# Patient Record
Sex: Female | Born: 1965
Health system: Southern US, Community
[De-identification: ages and names within clinical notes are randomized; demographics above are authoritative.]

## PROBLEM LIST (undated history)

## (undated) DIAGNOSIS — K859 Acute pancreatitis without necrosis or infection, unspecified: Secondary | ICD-10-CM

## (undated) DIAGNOSIS — I10 Essential (primary) hypertension: Secondary | ICD-10-CM

## (undated) DIAGNOSIS — K5792 Diverticulitis of intestine, part unspecified, without perforation or abscess without bleeding: Secondary | ICD-10-CM

## (undated) DIAGNOSIS — E785 Hyperlipidemia, unspecified: Secondary | ICD-10-CM

## (undated) DIAGNOSIS — E042 Nontoxic multinodular goiter: Secondary | ICD-10-CM

## (undated) DIAGNOSIS — I499 Cardiac arrhythmia, unspecified: Secondary | ICD-10-CM

## (undated) DIAGNOSIS — E039 Hypothyroidism, unspecified: Secondary | ICD-10-CM

## (undated) DIAGNOSIS — G4730 Sleep apnea, unspecified: Secondary | ICD-10-CM

## (undated) HISTORY — DX: Nontoxic multinodular goiter: E04.2

## (undated) HISTORY — DX: Hypothyroidism, unspecified: E03.9

## (undated) HISTORY — DX: Diverticulitis of intestine, part unspecified, without perforation or abscess without bleeding: K57.92

## (undated) HISTORY — DX: Hyperlipidemia, unspecified: E78.5

## (undated) HISTORY — DX: Acute pancreatitis without necrosis or infection, unspecified: K85.90

## (undated) HISTORY — DX: Sleep apnea, unspecified: G47.30

---

## 1998-06-09 DIAGNOSIS — K859 Acute pancreatitis without necrosis or infection, unspecified: Secondary | ICD-10-CM

## 1998-06-09 HISTORY — DX: Acute pancreatitis without necrosis or infection, unspecified: K85.90

## 1998-06-09 HISTORY — PX: CHOLECYSTECTOMY: SHX55

## 2007-10-20 ENCOUNTER — Ambulatory Visit: Payer: Self-pay | Admitting: Internal Medicine

## 2011-09-26 ENCOUNTER — Encounter: Payer: Self-pay | Admitting: Internal Medicine

## 2011-09-26 ENCOUNTER — Ambulatory Visit (INDEPENDENT_AMBULATORY_CARE_PROVIDER_SITE_OTHER): Payer: BC Managed Care – PPO | Admitting: Internal Medicine

## 2011-09-26 VITALS — BP 152/90 | HR 99 | Temp 98.4°F | Resp 16 | Ht 64.5 in | Wt 241.5 lb

## 2011-09-26 DIAGNOSIS — Z8719 Personal history of other diseases of the digestive system: Secondary | ICD-10-CM | POA: Insufficient documentation

## 2011-09-26 DIAGNOSIS — E782 Mixed hyperlipidemia: Secondary | ICD-10-CM | POA: Insufficient documentation

## 2011-09-26 DIAGNOSIS — E042 Nontoxic multinodular goiter: Secondary | ICD-10-CM | POA: Insufficient documentation

## 2011-09-26 DIAGNOSIS — F3289 Other specified depressive episodes: Secondary | ICD-10-CM

## 2011-09-26 DIAGNOSIS — E669 Obesity, unspecified: Secondary | ICD-10-CM

## 2011-09-26 DIAGNOSIS — F32A Depression, unspecified: Secondary | ICD-10-CM

## 2011-09-26 DIAGNOSIS — F329 Major depressive disorder, single episode, unspecified: Secondary | ICD-10-CM

## 2011-09-26 DIAGNOSIS — R5381 Other malaise: Secondary | ICD-10-CM

## 2011-09-26 DIAGNOSIS — R5383 Other fatigue: Secondary | ICD-10-CM

## 2011-09-26 DIAGNOSIS — E785 Hyperlipidemia, unspecified: Secondary | ICD-10-CM

## 2011-09-26 DIAGNOSIS — E039 Hypothyroidism, unspecified: Secondary | ICD-10-CM

## 2011-09-26 MED ORDER — BUPROPION HCL ER (SR) 100 MG PO TB12: 100.0000 mg | ORAL_TABLET | Freq: Two times a day (BID) | ORAL | Status: DC

## 2011-09-26 MED ORDER — ALPRAZOLAM 0.25 MG PO TABS: 0.2500 mg | ORAL_TABLET | Freq: Every evening | ORAL | Status: DC | PRN

## 2011-09-26 NOTE — Patient Instructions (Signed)
Consider the Low Glycemic Index Diet and 6 smaller meals daily :   7 AM Low carbohydrate Protein  Shakes (EAS Carb Control  Or Atkins ,  Available everywhere,   In  cases at BJs )  2.5 carbs  (Add or substitute a toasted sandwhich thin w/ peanut butter)  10 AM: Protein bar by Atkins (snack size,  Chocolate lover's variety at  BJ's)    Lunch: sandwich on pita bread or flatbread (Joseph's makes a pita bread and a flat bread , available at [employer] and BJ's; Toufayah makes a low carb flatbread available at [employer] and [employer])  Mission and bueno vida both make low carb whole wheat tortilla   3 PM:  Mid day :  Another protein bar,  Or a  cheese stick, 1/4 cup of almonds, walnuts, pistachios, pecans, peanuts,  Macadamia nuts  6 PM  Dinner:  "mean and green:"  Meat/chicken/fish, salad, and green veggie : use ranch, vinagrette,  Blue cheese, etc  9 PM snack : Breyer's low carb fudgiscle or  ice cream bar (Carb Smart) Weight Watcher's ice cream bar , or another protein shake

## 2011-09-26 NOTE — Progress Notes (Signed)
Patient ID: Brittany Brooks, female   DOB: 1965/06/18, 46 y.o.   MRN: [id number]  Patient Active Problem List  Diagnoses  . Multinodular goiter (nontoxic)  . Hyperlipidemia  . Pancreatitis  . Obesity (BMI 30-39.9)  . Unspecified hypothyroidism  . Depression    Subjective:  CC:   Chief Complaint  Patient presents with  . New Patient    HPI:   Brittany Brooks a 46 y.o. female who presents to establish care.  She is peri menopausal with some symptoms of depression and anxiety over the two years complicated by financial stressors.  Her symptoms are managed with infrequent use of alprazolam prescribed by her prior PCP Thedore Mins, but she has a history of a prior trial of wellbutrin . Wakes up feeling blue mostly every day.  She has had a weight gain of 40 lbs over 2 years.     Past Medical History  Diagnosis Date  . Hypothyroidism     since early 20's, secondary to Hashimoto's  . Multinodular goiter (nontoxic)     biopsy negativre by [facility]  . Hyperlipidemia   . Pancreatitis 2000    secondary to cholangiogram during lap chole, Sankar    Past Surgical History  Procedure Date  . Cholecystectomy 2000    recurrent cholecystitis during pregnancy  . Cesarean section Sept 2000         The following portions of the patient's history were reviewed and updated as appropriate: Allergies, current medications, and problem list.    Review of Systems:   12 Pt  review of systems was negative except those addressed in the HPI,     History   Social History  . Marital Status: Married    Spouse Name: N/A    Number of Children: N/A  . Years of Education: N/A   Occupational History  . Not on file.   Social History Main Topics  . Smoking status: Never Smoker   . Smokeless tobacco: Never Used  . Alcohol Use: 2.4 oz/week    4 Glasses of wine per week  . Drug Use: No  . Sexually Active: Not on file   Other Topics Concern  . Not on file   Social History Narrative  . No  narrative on file    Objective:  BP 152/90  Pulse 99  Temp(Src) 98.4 F (36.9 C) (Oral)  Resp 16  Ht 5' 4.5" (1.638 m)  Wt 241 lb 8 oz (109.544 kg)  BMI 40.81 kg/m2  SpO2 98%  LMP 09/19/2011  General appearance: alert, cooperative and appears stated age Ears: normal TM's and external ear canals both ears Throat: lips, mucosa, and tongue normal; teeth and gums normal Neck: no adenopathy, no carotid bruit, supple, symmetrical, trachea midline and thyroid not enlarged, symmetric, no tenderness/mass/nodules Back: symmetric, no curvature. ROM normal. No CVA tenderness. Lungs: clear to auscultation bilaterally Heart: regular rate and rhythm, S1, S2 normal, no murmur, click, rub or gallop Abdomen: soft, non-tender; bowel sounds normal; no masses,  no organomegaly Pulses: 2+ and symmetric Skin: Skin color, texture, turgor normal. No rashes or lesions Lymph nodes: Cervical, supraclavicular, and axillary nodes normal.  Assessment and Plan:  Obesity (BMI 30-39.9) I have addressed  BMI and recommended a low glycemic index diet utilizing smaller more frequent meals to increase metabolism.  I have also recommended that patient start exercising with a goal of 30 minutes of aerobic exercise a minimum of 5 days per week. Screening for lipid disorders, thyroid and diabetes to be done  today.     Unspecified hypothyroidism Managed with Synthroid.   Depression We discussed resuming a trial of wellbutrin to help manage her symptoms .  Will start with wellbutrin sr 100 mg bid.     Updated Medication List Outpatient Encounter Prescriptions as of 09/26/2011  Medication Sig Dispense Refill  . ALPRAZolam (XANAX) 0.25 MG tablet Take 1 tablet (0.25 mg total) by mouth at bedtime as needed.  30 tablet  3  . buPROPion (WELLBUTRIN SR) 100 MG 12 hr tablet Take 1 tablet (100 mg total) by mouth 2 (two) times daily.  60 tablet  2  . levothyroxine (SYNTHROID, LEVOTHROID) 88 MCG tablet Take 88 mcg by mouth  daily.      . Multiple Vitamin (MULTIVITAMIN) tablet Take 1 tablet by mouth daily.      Marland Kitchen DISCONTD: ALPRAZolam (XANAX) 0.25 MG tablet Take 0.25 mg by mouth at bedtime as needed.         Orders Placed This Encounter  Procedures  . TSH  . Lipid panel  . COMPLETE METABOLIC PANEL WITH GFR  . CBC with Differential    No Follow-up on file.

## 2011-09-28 ENCOUNTER — Encounter: Payer: Self-pay | Admitting: Internal Medicine

## 2011-09-28 DIAGNOSIS — E038 Other specified hypothyroidism: Secondary | ICD-10-CM | POA: Insufficient documentation

## 2011-09-28 DIAGNOSIS — F339 Major depressive disorder, recurrent, unspecified: Secondary | ICD-10-CM | POA: Insufficient documentation

## 2011-09-28 DIAGNOSIS — E669 Obesity, unspecified: Secondary | ICD-10-CM | POA: Insufficient documentation

## 2011-09-28 NOTE — Assessment & Plan Note (Signed)
I have addressed  BMI and recommended a low glycemic index diet utilizing smaller more frequent meals to increase metabolism.  I have also recommended that patient start exercising with a goal of 30 minutes of aerobic exercise a minimum of 5 days per week. Screening for lipid disorders, thyroid and diabetes to be done today.   

## 2011-09-28 NOTE — Assessment & Plan Note (Signed)
We discussed resuming a trial of wellbutrin to help manage her symptoms .  Will start with wellbutrin sr 100 mg bid.

## 2011-09-28 NOTE — Assessment & Plan Note (Signed)
Managed with Synthroid.

## 2011-09-30 ENCOUNTER — Other Ambulatory Visit: Payer: BC Managed Care – PPO

## 2011-10-03 ENCOUNTER — Other Ambulatory Visit (INDEPENDENT_AMBULATORY_CARE_PROVIDER_SITE_OTHER): Payer: BC Managed Care – PPO | Admitting: *Deleted

## 2011-10-03 DIAGNOSIS — R5381 Other malaise: Secondary | ICD-10-CM

## 2011-10-03 DIAGNOSIS — E785 Hyperlipidemia, unspecified: Secondary | ICD-10-CM

## 2011-10-03 DIAGNOSIS — R5383 Other fatigue: Secondary | ICD-10-CM

## 2011-10-03 DIAGNOSIS — E039 Hypothyroidism, unspecified: Secondary | ICD-10-CM

## 2011-10-03 LAB — CBC WITH DIFFERENTIAL/PLATELET
Basophils Relative: 0.4 % (ref 0.0–3.0)
Hemoglobin: 12.9 g/dL (ref 12.0–15.0)
Lymphocytes Relative: 19 % (ref 12.0–46.0)
Monocytes Relative: 7.8 % (ref 3.0–12.0)
Neutro Abs: 6 10*3/uL (ref 1.4–7.7)
RBC: 4.82 Mil/uL (ref 3.87–5.11)

## 2011-10-03 LAB — LIPID PANEL
Cholesterol: 263 mg/dL — ABNORMAL HIGH (ref 0–200)
HDL: 36 mg/dL — ABNORMAL LOW (ref >39.00)
Total CHOL/HDL Ratio: 7
Triglycerides: 294 mg/dL — ABNORMAL HIGH (ref 0.0–149.0)
VLDL: 58.8 mg/dL — ABNORMAL HIGH (ref 0.0–40.0)

## 2011-10-04 LAB — COMPLETE METABOLIC PANEL WITH GFR
ALT: 23 U/L (ref 0–35)
CO2: 26 mEq/L (ref 19–32)
Creat: 0.66 mg/dL (ref 0.50–1.10)
GFR, Est African American: >89 mL/min
GFR, Est Non African American: >89 mL/min
Total Bilirubin: 0.4 mg/dL (ref 0.3–1.2)

## 2011-10-20 ENCOUNTER — Other Ambulatory Visit: Payer: Self-pay | Admitting: Internal Medicine

## 2011-10-20 MED ORDER — LEVOTHYROXINE SODIUM 88 MCG PO TABS: 88.0000 ug | ORAL_TABLET | Freq: Every day | ORAL | Status: DC

## 2011-10-29 ENCOUNTER — Ambulatory Visit: Payer: BC Managed Care – PPO | Admitting: Internal Medicine

## 2012-04-28 ENCOUNTER — Encounter: Payer: Self-pay | Admitting: Internal Medicine

## 2012-04-28 ENCOUNTER — Ambulatory Visit (INDEPENDENT_AMBULATORY_CARE_PROVIDER_SITE_OTHER): Payer: BC Managed Care – PPO | Admitting: Internal Medicine

## 2012-04-28 VITALS — BP 138/82 | HR 80 | Temp 98.4°F | Resp 12 | Ht 66.0 in | Wt 243.5 lb

## 2012-04-28 DIAGNOSIS — F3289 Other specified depressive episodes: Secondary | ICD-10-CM

## 2012-04-28 DIAGNOSIS — E785 Hyperlipidemia, unspecified: Secondary | ICD-10-CM

## 2012-04-28 DIAGNOSIS — E8881 Metabolic syndrome: Secondary | ICD-10-CM

## 2012-04-28 DIAGNOSIS — F32A Depression, unspecified: Secondary | ICD-10-CM

## 2012-04-28 DIAGNOSIS — F329 Major depressive disorder, single episode, unspecified: Secondary | ICD-10-CM

## 2012-04-28 DIAGNOSIS — E669 Obesity, unspecified: Secondary | ICD-10-CM

## 2012-04-28 DIAGNOSIS — E039 Hypothyroidism, unspecified: Secondary | ICD-10-CM

## 2012-04-28 MED ORDER — CITALOPRAM HYDROBROMIDE 10 MG PO TABS: 10.0000 mg | ORAL_TABLET | Freq: Every day | ORAL | Status: DC

## 2012-04-28 MED ORDER — ALPRAZOLAM 0.25 MG PO TABS: 0.2500 mg | ORAL_TABLET | Freq: Every evening | ORAL | Status: DC | PRN

## 2012-04-28 NOTE — Progress Notes (Signed)
Patient ID: Brittany Brooks, female   DOB: 30-Mar-1966, 46 y.o.   MRN: [id number]   Patient Active Problem List  Diagnosis  . Multinodular goiter (nontoxic)  . Hyperlipidemia  . Pancreatitis  . Obesity (BMI 30-39.9)  . Unspecified hypothyroidism  . Depression  . Metabolic syndrome    Subjective:  CC:   Chief Complaint  Patient presents with  . Follow-up    HPI:   Brittany Brooks a 46 y.o. female who presents  Follow up on chronic medical conditions including depression, anxiety and obesity.  Her periods have been irregular.  Some are heavy and lasting 7 days,  Some last 2 days, stop and  then return. Cycle is usually  25 to 30 days.  2) Depression:  She did not resume the trial of  wellbutrin .  She has been having increased anxiety.  Last month used xanax daily in the morning to manage waking up with jitters.  Cites stressor both at work and at home .  Wants to try citalopram.     Past Medical History  Diagnosis Date  . Hypothyroidism     since early 20's, secondary to Hashimoto's  . Multinodular goiter (nontoxic)     biopsy negativre by [facility]  . Hyperlipidemia   . Pancreatitis 2000    secondary to cholangiogram during lap chole, Sankar    Past Surgical History  Procedure Date  . Cholecystectomy 2000    recurrent cholecystitis during pregnancy  . Cesarean section Sept 2000    Current Outpatient Prescriptions on File Prior to Visit  Medication Sig Dispense Refill  . levothyroxine (SYNTHROID, LEVOTHROID) 88 MCG tablet Take 1 tablet (88 mcg total) by mouth daily.  30 tablet  3  . Multiple Vitamin (MULTIVITAMIN) tablet Take 1 tablet by mouth daily.      . citalopram (CELEXA) 10 MG tablet Take 1 tablet (10 mg total) by mouth daily.  30 tablet  3   No Known Allergies  The following portions of the patient's history were reviewed and updated as appropriate: Allergies, current medications, and problem list.    History   Social History  . Marital Status: Married   Spouse Name: N/A    Number of Children: N/A  . Years of Education: N/A   Occupational History  . Not on file.   Social History Main Topics  . Smoking status: Never Smoker   . Smokeless tobacco: Never Used  . Alcohol Use: 2.4 oz/week    4 Glasses of wine per week  . Drug Use: No  . Sexually Active: Not on file   Other Topics Concern  . Not on file   Social History Narrative  . No narrative on file   Review of Systems  Patient denies headache, fevers, malaise, unintentional weight loss, skin rash, eye pain, sinus congestion and sinus pain, sore throat, dysphagia,  hemoptysis , cough, dyspnea, wheezing, chest pain, palpitations, orthopnea, edema, abdominal pain, nausea, melena, diarrhea, constipation, flank pain, dysuria, hematuria, urinary  Frequency, nocturia, numbness, tingling, seizures,  Focal weakness, Loss of consciousness,  Tremor, insomnia, depression, anxiety, and suicidal ideation.     Objective:  BP 138/82  Pulse 80  Temp 98.4 F (36.9 C) (Oral)  Resp 12  Ht 5\' 6"  (1.676 m)  Wt 243 lb 8 oz (110.451 kg)  BMI 39.30 kg/m2  SpO2 97%  LMP 04/19/2012  General appearance: alert, cooperative and appears stated age Ears: normal TM's and external ear canals both ears Throat: lips, mucosa, and tongue normal;  teeth and gums normal Neck: no adenopathy, no carotid bruit, supple, symmetrical, trachea midline and thyroid not enlarged, symmetric, no tenderness/mass/nodules Back: symmetric, no curvature. ROM normal. No CVA tenderness. Lungs: clear to auscultation bilaterally Heart: regular rate and rhythm, S1, S2 normal, no murmur, click, rub or gallop Abdomen: soft, non-tender; bowel sounds normal; no masses,  no organomegaly Pulses: 2+ and symmetric Skin: Skin color, texture, turgor normal. No rashes or lesions Lymph nodes: Cervical, supraclavicular, and axillary nodes normal.  Assessment and Plan: Metabolic syndrome With hyperlipidemia, obesity and menstrual  irregularities strongly suggestive of PCOS. I have addressed  BMI and recommended a low glycemic index diet utilizing smaller more frequent meals to increase metabolism.  I have also recommended that patient start exercising with a goal of 30 minutes of aerobic exercise a minimum of 5 days per week. Screening for lipid disorders, thyroid and diabetes to be done soon when patient is fasting.   Obesity (BMI 30-39.9) Her BMI remains unchanged despite exercise. We discussed the nature and quality of her exercises well as of her diet. Usually what I find is that people are not exercising as vigorously as they should to achieve a sustained heart rate in the aerobic zone. I suggested that she consider joining a gym and using a personal trainer to help guide her efforts.   I also am advising her to get back on the low GI diet using six smaller meals a day to stimulate her metabolism.    Depression Agree with trial of citalopram, starting with 4 mg daily for one week, and titrating up every two weeks.  Return in one month.    Updated Medication List Outpatient Encounter Prescriptions as of 04/28/2012  Medication Sig Dispense Refill  . ALPRAZolam (XANAX) 0.25 MG tablet Take 1 tablet (0.25 mg total) by mouth at bedtime as needed.  30 tablet  3  . levothyroxine (SYNTHROID, LEVOTHROID) 88 MCG tablet Take 1 tablet (88 mcg total) by mouth daily.  30 tablet  3  . Multiple Vitamin (MULTIVITAMIN) tablet Take 1 tablet by mouth daily.      . [DISCONTINUED] ALPRAZolam (XANAX) 0.25 MG tablet Take 1 tablet (0.25 mg total) by mouth at bedtime as needed.  30 tablet  3  . citalopram (CELEXA) 10 MG tablet Take 1 tablet (10 mg total) by mouth daily.  30 tablet  3  . [DISCONTINUED] buPROPion (WELLBUTRIN SR) 100 MG 12 hr tablet Take 1 tablet (100 mg total) by mouth 2 (two) times daily.  60 tablet  2

## 2012-04-28 NOTE — Patient Instructions (Addendum)
Start with 1/2 tablet of citalopram for a few days  Before moving up to whole tablet.  Take  With dinner  We can increase to 20 mg after two weeks if you are handling the 10 mg dose without side effects..   Return for fasting labs  And thyroid check.   This is  my version of a  "Low GI"  Diet:  All of the foods can be found at grocery stores and in bulk at [employer].  The Atkins protein bars and shakes are available in more varieties at [employer], [employer] and [employer].     7 AM Breakfast:  Low carbohydrate Protein  Shakes (I recommend the EAS [employer]Edge "Carb Control" shakes  Or the low carb shakes by Atkins.   Both are available everywhere:  In  cases at BJs  Or in 4 packs at grocery stores and pharmacies  2.5 carbs  (Alternative is  a toasted Arnold's Sandwhich Thin w/ peanut butter, a "Bagel Thin" with cream cheese and salmon) or  a scrambled egg burrito made with a low carb tortilla .  Avoid cereal and bananas, oatmeal too unless you are cooking the old fashioned kind that takes 30-40 minutes to prepare.  the rest is overly processed, has minimal fiber, and is loaded with carbohydrates!   10 AM: Protein bar by Atkins (the snack size, under 200 cal).  There are many varieties , available widely again or in bulk in limited varieties at BJs)  Other so called "protein bars" tend to be loaded with carbohydrates.  Remember, in food advertising, the word "energy" is synonymous for " carbohydrate."  Lunch: sandwich of [place], (or any lunchmeat, grilled meat or canned tuna), fresh avocado, mayonnaise  and cheese on a lower carbohydrate pita bread, flatbread, or tortilla . Ok to use regular mayonnaise. The bread is the only source or carbohydrate that can be decreased (Joseph's makes a pita bread and a flat bread that are 50 cal and 4 net carbs ; Toufayan makes a low carb flatbread that's 100 cal and 9 net carbs  and  Mission makes a low carb whole wheat tortilla  That is 210 cal and 6 net carbs)  3 PM:   Mid day :  Another protein bar,  Or a  cheese stick (100 cal, 0 carbs),  Or 1 ounce of  almonds, walnuts, pistachios, pecans, peanuts,  Macadamia nuts. Or a Dannon light n Fit greek yogurt, 80 cal 8 net carbs . Avoid "granola"; the dried cranberries and raisins are loaded with carbohydrates. Mixed nuts ok if no raisins or cranberries or dried fruit.      6 PM  Dinner:  "mean and green:"  Meat/chicken/fish or a high protein legume; , with a green salad, and a low GI  Veggie (broccoli, cauliflower, green beans, spinach, brussel sprouts. Lima beans) : Avoid "Low fat dressings, as well as Reyne Dumas and [address]! They are loaded with sugar! Instead use ranch, vinagrette,  Blue cheese, etc.  There is a low carb pasta by Dreamfield's available at [employer] [facility] that is acceptable and tastes great. Try Michel Angel's chicken piccata over low carb pasta. The chicken dish is 0 carbs, and can be found in frozen section at BJs and Lowe's. Also try [employer] "Carnitas" (pulled pork, no sauce,  0 carbs) and his pot roast.   both are in the refrigerated section at BJs   9 PM snack : Breyer's "low carb" fudgsicle or  ice cream  bar (Carb Smart line), or  Weight Watcher's ice cream bar , or another "no sugar added" ice cream;a serving of fresh berries/cherries with whipped cream (Avoid bananas, pineapple, grapes  and watermelon on a regular basis because they are high in sugar)   Remember that snack Substitutions should be less than 15 to 20 carbs  Per serving. Remember to subtract fiber grams and sugar alcohols to get the "net carbs."

## 2012-04-29 ENCOUNTER — Other Ambulatory Visit: Payer: Self-pay

## 2012-04-29 DIAGNOSIS — E282 Polycystic ovarian syndrome: Secondary | ICD-10-CM | POA: Insufficient documentation

## 2012-04-29 NOTE — Assessment & Plan Note (Addendum)
With hyperlipidemia, obesity and menstrual irregularities strongly suggestive of PCOS. I have addressed  BMI and recommended a low glycemic index diet utilizing smaller more frequent meals to increase metabolism.  I have also recommended that patient start exercising with a goal of 30 minutes of aerobic exercise a minimum of 5 days per week. Screening for lipid disorders, thyroid and diabetes to be done soon when patient is fasting.

## 2012-05-01 ENCOUNTER — Encounter: Payer: Self-pay | Admitting: Internal Medicine

## 2012-05-01 NOTE — Assessment & Plan Note (Signed)
Agree with trial of citalopram, starting with 4 mg daily for one week, and titrating up every two weeks.  Return in one month.

## 2012-05-01 NOTE — Assessment & Plan Note (Signed)
Her BMI remains unchanged despite exercise. We discussed the nature and quality of her exercises well as of her diet. Usually what I find is that people are not exercising as vigorously as they should to achieve a sustained heart rate in the aerobic zone. I suggested that she consider joining a gym and using a personal trainer to help guide her efforts.   I also am advising her to get back on the low GI diet using six smaller meals a day to stimulate her metabolism.   

## 2012-05-04 ENCOUNTER — Other Ambulatory Visit: Payer: BC Managed Care – PPO

## 2012-05-18 ENCOUNTER — Other Ambulatory Visit (INDEPENDENT_AMBULATORY_CARE_PROVIDER_SITE_OTHER): Payer: BC Managed Care – PPO

## 2012-05-18 DIAGNOSIS — E039 Hypothyroidism, unspecified: Secondary | ICD-10-CM

## 2012-05-18 DIAGNOSIS — E785 Hyperlipidemia, unspecified: Secondary | ICD-10-CM

## 2012-05-18 DIAGNOSIS — E8881 Metabolic syndrome: Secondary | ICD-10-CM

## 2012-05-18 LAB — COMPREHENSIVE METABOLIC PANEL
ALT: 31 U/L (ref 0–35)
Albumin: 3.9 g/dL (ref 3.5–5.2)
Alkaline Phosphatase: 54 U/L (ref 39–117)
Glucose, Bld: 106 mg/dL — ABNORMAL HIGH (ref 70–99)
Potassium: 3.9 mEq/L (ref 3.5–5.1)
Sodium: 137 mEq/L (ref 135–145)
Total Protein: 7.6 g/dL (ref 6.0–8.3)

## 2012-05-18 LAB — LIPID PANEL
HDL: 30.7 mg/dL — ABNORMAL LOW (ref >39.00)
Total CHOL/HDL Ratio: 8

## 2012-05-18 LAB — TSH: TSH: 5.15 u[IU]/mL (ref 0.35–5.50)

## 2012-05-19 MED ORDER — LEVOTHYROXINE SODIUM 100 MCG PO TABS: 100.0000 ug | ORAL_TABLET | Freq: Every day | ORAL | Status: DC

## 2012-05-19 NOTE — Addendum Note (Signed)
Addended by: Sherlene Shams on: 05/19/2012 01:32 PM   Modules accepted: Orders

## 2012-07-13 ENCOUNTER — Other Ambulatory Visit (INDEPENDENT_AMBULATORY_CARE_PROVIDER_SITE_OTHER): Payer: BC Managed Care – PPO

## 2012-07-13 DIAGNOSIS — E785 Hyperlipidemia, unspecified: Secondary | ICD-10-CM

## 2012-07-13 DIAGNOSIS — E039 Hypothyroidism, unspecified: Secondary | ICD-10-CM

## 2012-07-13 DIAGNOSIS — E8881 Metabolic syndrome: Secondary | ICD-10-CM

## 2012-07-13 LAB — TSH: TSH: 6.22 u[IU]/mL — ABNORMAL HIGH (ref 0.35–5.50)

## 2012-07-13 LAB — LIPID PANEL
Cholesterol: 253 mg/dL — ABNORMAL HIGH (ref 0–200)
HDL: 29.8 mg/dL — ABNORMAL LOW (ref >39.00)
Triglycerides: 363 mg/dL — ABNORMAL HIGH (ref 0.0–149.0)
VLDL: 72.6 mg/dL — ABNORMAL HIGH (ref 0.0–40.0)

## 2012-07-13 LAB — COMPREHENSIVE METABOLIC PANEL
AST: 32 U/L (ref 0–37)
Alkaline Phosphatase: 56 U/L (ref 39–117)
BUN: 12 mg/dL (ref 6–23)
Creatinine, Ser: 0.7 mg/dL (ref 0.4–1.2)
Glucose, Bld: 112 mg/dL — ABNORMAL HIGH (ref 70–99)

## 2012-07-13 LAB — HEMOGLOBIN A1C: Hgb A1c MFr Bld: 6.1 % (ref 4.6–6.5)

## 2012-07-13 LAB — LDL CHOLESTEROL, DIRECT: Direct LDL: 134.6 mg/dL

## 2012-07-13 MED ORDER — LEVOTHYROXINE SODIUM 112 MCG PO TABS: 112.0000 ug | ORAL_TABLET | Freq: Every day | ORAL | Status: DC

## 2012-07-13 NOTE — Addendum Note (Signed)
Addended by: Sherlene Shams on: 07/13/2012 05:33 PM   Modules accepted: Orders

## 2012-07-13 NOTE — Assessment & Plan Note (Signed)
Thyroid function is underactive on current 100 mcg  dose.  I have sent a lower dose of levothyroxine to her pharmacy

## 2012-07-20 ENCOUNTER — Encounter: Payer: Self-pay | Admitting: Internal Medicine

## 2012-07-20 ENCOUNTER — Ambulatory Visit (INDEPENDENT_AMBULATORY_CARE_PROVIDER_SITE_OTHER): Payer: BC Managed Care – PPO | Admitting: Internal Medicine

## 2012-07-20 VITALS — BP 130/100 | HR 98 | Temp 98.1°F | Resp 16 | Wt 245.0 lb

## 2012-07-20 DIAGNOSIS — M539 Dorsopathy, unspecified: Secondary | ICD-10-CM

## 2012-07-20 DIAGNOSIS — E282 Polycystic ovarian syndrome: Secondary | ICD-10-CM

## 2012-07-20 DIAGNOSIS — M4322 Fusion of spine, cervical region: Secondary | ICD-10-CM

## 2012-07-20 DIAGNOSIS — E782 Mixed hyperlipidemia: Secondary | ICD-10-CM

## 2012-07-20 DIAGNOSIS — E039 Hypothyroidism, unspecified: Secondary | ICD-10-CM

## 2012-07-20 DIAGNOSIS — F339 Major depressive disorder, recurrent, unspecified: Secondary | ICD-10-CM

## 2012-07-20 MED ORDER — HYDROCODONE-ACETAMINOPHEN 7.5-750 MG PO TABS: 1.0000 | ORAL_TABLET | Freq: Every evening | ORAL | Status: DC | PRN

## 2012-07-20 MED ORDER — TRAMADOL HCL 50 MG PO TABS: 50.0000 mg | ORAL_TABLET | Freq: Four times a day (QID) | ORAL | Status: DC | PRN

## 2012-07-20 MED ORDER — HYDROCODONE-ACETAMINOPHEN 5-325 MG PO TABS: 1.0000 | ORAL_TABLET | Freq: Four times a day (QID) | ORAL | Status: DC | PRN

## 2012-07-20 MED ORDER — PREDNISONE (PAK) 10 MG PO TABS: ORAL_TABLET | ORAL | Status: DC

## 2012-07-20 NOTE — Progress Notes (Signed)
Patient ID: Brittany Brooks, female   DOB: 1965/11/24, 47 y.o.   MRN: [id number]  Patient Active Problem List  Diagnosis  . Multinodular goiter (nontoxic)  . Mixed dyslipidemia  . Pancreatitis  . Obesity (BMI 30-39.9)  . Unspecified hypothyroidism  . Major depressive disorder, recurrent episode, unspecified  . Polycystic ovarian syndrome  . Cervical spine ankylosis    Subjective:  CC:   Chief Complaint  Patient presents with  . Follow-up    Labs    HPI:   Brittany Brooks a 47 y.o. female who presents Follow up on depression,  hypothyroidism, and obesity with PCOS, along with acute problems.  1) Obesity;  She has not lost weight yet but has made some attempts to reduce starches from her diet. She is not exercising. Her fasting labs were done recently and an elevatedd fasting glucose level and underactive thyroid level were noted.    2) She has been suffering for 2 months with sinus congestion, constant drainage and cough.  No fevers, facial or ear pain.  Using OTC decongestants.    3) She has been having posterior neck pain for several month which radiates to left arm.  She has been receiving Chiropractic manipulation from [facility] without improvement and he believes she has a C4-C6 vertebral problem and recommended an orthopedic evaluation.  She has made an appt to see Reita Chard.  The pai nis constant but has flares of increased discomfort lasting up to  A week at a time which she is treating with excessive daily amount s of Advil.  No recent unusual or repetitive activity to suggest an injury.  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  Wears a headset . She recently tried a friend's TENS Unit which aggravated it.   4) her depression/anxiety  has responded to citalopram and she is functioning well .     Past Medical History  Diagnosis Date  . Hypothyroidism     since early 20's, secondary to Hashimoto's  . Multinodular goiter (nontoxic)     biopsy negativre by [facility]   . Hyperlipidemia   . Pancreatitis 2000    secondary to cholangiogram during lap chole, Sankar    Past Surgical History  Procedure Laterality Date  . Cholecystectomy  2000    recurrent cholecystitis during pregnancy  . Cesarean section  Sept 2000       The following portions of the patient's history were reviewed and updated as appropriate: Allergies, current medications, and problem list.    Review of Systems:   Patient denies headache, fevers, malaise, unintentional weight loss, skin rash, eye pain, sinus pain, sore throat, dysphagia,  hemoptysis , cough, dyspnea, wheezing, chest pain, palpitations, orthopnea, edema, abdominal pain, nausea, melena, diarrhea, constipation, flank pain, dysuria, hematuria, urinary  Frequency, nocturia, numbness, tingling, seizures,  Focal weakness, Loss of consciousness,  Tremor, insomnia, depression, anxiety, and suicidal ideation.        History   Social History  . Marital Status: Married    Spouse Name: N/A    Number of Children: N/A  . Years of Education: N/A   Occupational History  . Not on file.   Social History Main Topics  . Smoking status: Never Smoker   . Smokeless tobacco: Never Used  . Alcohol Use: 2.4 oz/week    4 Glasses of wine per week  . Drug Use: No  . Sexually Active: Not on file   Other Topics Concern  . Not on file  Social History Narrative  . No narrative on file    Objective:  BP 130/100  Pulse 98  Temp(Src) 98.1 F (36.7 C) (Oral)  Resp 16  Wt 245 lb (111.131 kg)  BMI 39.56 kg/m2  SpO2 97%  LMP 07/09/2012  General appearance: alert, cooperative and appears stated age Ears: normal TM's and external ear canals both ears Sinuses:  No maxillary or frontal sinus tenderness Throat: lips, mucosa, and tongue normal; oropharynx normal Neck: no adenopathy, no carotid bruit, supple, symmetrical, trachea midline and thyroid not enlarged, symmetric, no tenderness/mass/nodules Back: symmetric, no  curvature. ROM normal. No CVA tenderness. Lungs: clear to auscultation bilaterally Heart: regular rate and rhythm, S1, S2 normal, no murmur, click, rub or gallop Abdomen: soft, non-tender; bowel sounds normal; no masses,  no organomegaly Pulses: 2+ and symmetric Skin: Skin color, texture, turgor normal. No rashes or lesions Lymph nodes: Cervical, supraclavicular, and axillary nodes normal.  Assessment and Plan:  Major depressive disorder, recurrent episode, unspecified Symptoms have improved with citalopram resumed one month ago. Denies insomnia, malaise and anhedonia.   Polycystic ovarian syndrome Suggested by history of obesity, irregular menstrual cycle, impaired fasting glucose  And dyslipidemia with  HDL < 39 and trigs of 300.  I have addressed  BMI and discussed and provided a low glycemic index diet utilizing smaller more frequent meals to increase metabolism.  I have also recommended that patient start exercising with a goal of 30 minutes of aerobic exercise a minimum of 5 days per week. Return for repeat labs (lipids and A1c) in 6 weeks.    Unspecified hypothyroidism Secondary to MNG.  tsh is elevated and Synthroid dose has been increased.,  Will return in 6 weeks for repeat levels.   Mixed dyslipidemia HDL is < 30, trgis are 360 and LDl  Is normal at 136 (pending determination of diabetic status).  No meds unless Hga1c is > 6.5   Cervical spine ankylosis Presumed, with Orthopedic evaluation by self referral.  MSK and neuro exam normal.  Overusing NIADs due to constant pain.  Recommended trial of prednisone and tramadol and qhs vicodin prn.   A total of 40 minutes, face to face time, was spent in evaluation and treatment of patient, more than half of which was spent counselling patient and coorrdinating care.      Updated Medication List Outpatient Encounter Prescriptions as of 07/20/2012  Medication Sig Dispense Refill  . ALPRAZolam (XANAX) 0.25 MG tablet Take 1 tablet (0.25  mg total) by mouth at bedtime as needed.  30 tablet  3  . citalopram (CELEXA) 10 MG tablet Take 1 tablet (10 mg total) by mouth daily.  30 tablet  3  . Flaxseed, Linseed, (FLAX SEED OIL) 1000 MG CAPS Take 1,000 mg by mouth daily.      Marland Kitchen levothyroxine (SYNTHROID, LEVOTHROID) 112 MCG tablet Take 1 tablet (112 mcg total) by mouth daily.  30 tablet  3  . Multiple Vitamin (MULTIVITAMIN) tablet Take 1 tablet by mouth daily.      Marland Kitchen HYDROcodone-acetaminophen (NORCO/VICODIN) 5-325 MG per tablet Take 1 tablet by mouth every 6 (six) hours as needed for pain.  30 tablet  3  . predniSONE (STERAPRED UNI-PAK) 10 MG tablet 6 tablets on Day 1 , then reduce by 1 tablet daily until gone  21 tablet  0  . traMADol (ULTRAM) 50 MG tablet Take 1 tablet (50 mg total) by mouth every 6 (six) hours as needed for pain.  120 tablet  2  . [  DISCONTINUED] HYDROcodone-acetaminophen (VICODIN ES) 7.5-750 MG per tablet Take 1 tablet by mouth at bedtime as needed for pain.  30 tablet  1   No facility-administered encounter medications on file as of 07/20/2012.     Orders Placed This Encounter  Procedures  . TSH    No Follow-up on file.

## 2012-07-20 NOTE — Patient Instructions (Addendum)
use the tramadol during the day  Up to 6 daily if needed   Start the prednisone in the morning.  Take all 6 tablets at once on Day 1   Save the vicodin for evening.  beware of constipation

## 2012-07-22 ENCOUNTER — Encounter: Payer: Self-pay | Admitting: Internal Medicine

## 2012-07-22 DIAGNOSIS — M452 Ankylosing spondylitis of cervical region: Secondary | ICD-10-CM | POA: Insufficient documentation

## 2012-07-22 DIAGNOSIS — M4322 Fusion of spine, cervical region: Secondary | ICD-10-CM | POA: Insufficient documentation

## 2012-07-22 NOTE — Assessment & Plan Note (Signed)
Symptoms have improved with citalopram resumed one month ago. Denies insomnia, malaise and anhedonia.

## 2012-07-22 NOTE — Assessment & Plan Note (Signed)
Presumed, with Orthopedic evaluation by self referral.  MSK and neuro exam normal.  Overusing NIADs due to constant pain.  Recommended trial of prednisone and tramadol and qhs vicoin prn.

## 2012-07-22 NOTE — Assessment & Plan Note (Addendum)
Suggested by history of obesity, irregular menstrual cycle, impaired fasting glucose  And dyslipidemia with  HDL < 39 and trigs of 300.  I have addressed  BMI and recommended a low glycemic index diet utilizing smaller more frequent meals to increase metabolism.  I have also recommended that patient start exercising with a goal of 30 minutes of aerobic exercise a minimum of 5 days per week. Return for repeat labs (lipids and A1c) in 6 weeks.

## 2012-07-22 NOTE — Assessment & Plan Note (Signed)
HDL is < 30, trgis are 360 and LDl  Is normal at 136 (pending determination of diabetic status).  No meds unless Hga1c is > 6.5

## 2012-07-22 NOTE — Assessment & Plan Note (Signed)
Secondary to MNG.  tsh is elevated and Synthroid dose has been increased.,  Will return in 6 weeks for repeat levels.

## 2012-08-26 ENCOUNTER — Ambulatory Visit: Payer: Self-pay | Admitting: Specialist

## 2012-09-06 ENCOUNTER — Other Ambulatory Visit: Payer: BC Managed Care – PPO

## 2012-09-07 ENCOUNTER — Other Ambulatory Visit (INDEPENDENT_AMBULATORY_CARE_PROVIDER_SITE_OTHER): Payer: BC Managed Care – PPO

## 2012-09-07 DIAGNOSIS — E039 Hypothyroidism, unspecified: Secondary | ICD-10-CM

## 2012-09-08 ENCOUNTER — Encounter: Payer: Self-pay | Admitting: General Practice

## 2012-09-08 LAB — TSH: TSH: 1.78 u[IU]/mL (ref 0.35–5.50)

## 2012-09-08 NOTE — Progress Notes (Signed)
Letter mailed to pt.  

## 2012-10-21 ENCOUNTER — Encounter: Payer: Self-pay | Admitting: Specialist

## 2012-11-02 ENCOUNTER — Telehealth: Payer: Self-pay | Admitting: Internal Medicine

## 2012-11-02 MED ORDER — CEPHALEXIN 500 MG PO TABS: 500.0000 mg | ORAL_TABLET | Freq: Four times a day (QID) | ORAL | Status: DC

## 2012-11-02 NOTE — Telephone Encounter (Signed)
Patient notified

## 2012-11-02 NOTE — Telephone Encounter (Signed)
no

## 2012-11-02 NOTE — Telephone Encounter (Signed)
Patient stated she was able to get appointment with her Dermatologist  For tomorrow so so should she still pick up the Antibiotic. Please advise.

## 2012-11-02 NOTE — Telephone Encounter (Signed)
Patient Information:  Caller Name: Briza  Phone: [phone]  Patient: Armanii, Urbanik  Gender: Female  DOB: [birth year]  Age: 47 Years  PCP: Duncan Dull (Adults only)  Pregnant: No  Office Follow Up:  Does the office need to follow up with this patient?: Yes  Instructions For The Office: Disposition is Office Now - schedule is full.  Please contact pt with further instructions.   Symptoms  Reason For Call & Symptoms: 10/31/12 red bump on left breast at top closer to arm, little smaller than a dime, slightly raised, not itchy or painful, small spot in center from possbile bug bite.  Late evening, red streak developed from bump about 2.5 inches long.  Afebrile.  Applying hydrocortisone cream.  Reviewed Health History In EMR: Yes  Reviewed Medications In EMR: Yes  Reviewed Allergies In EMR: Yes  Reviewed Surgeries / Procedures: Yes  Date of Onset of Symptoms: 10/31/2012  Treatments Tried: Hydrocortisone cream  Treatments Tried Worked: No OB / GYN:  LMP: 10/25/2012  Guideline(s) Used:  Insect Bite  Disposition Per Guideline:   Go to Office Now  Reason For Disposition Reached:   Red streak or red line and length > 2 inches (5 cm)  Advice Given:  Antibiotic Ointment:   Cover the scab with a Band-Aid to prevent scratching and spread.  Repeat washing the sore, the antibiotic ointment, and the Band-Aid 4 times per day until healed.  Call Back If:  Severe pain lasts over 2 hours after pain medicine  You become worse.  Patient Will Follow Care Advice:  YES

## 2012-11-02 NOTE — Telephone Encounter (Signed)
Please ask her to start the antibitoic i sent to her pharamacy and have herfollow up with me or Raquel later on this week (wednesday or later)

## 2012-11-02 NOTE — Telephone Encounter (Signed)
Tried to call patient for further evaluation no answer please advise .

## 2012-11-07 ENCOUNTER — Encounter: Payer: Self-pay | Admitting: Specialist

## 2012-12-07 ENCOUNTER — Encounter: Payer: Self-pay | Admitting: Specialist

## 2012-12-08 ENCOUNTER — Other Ambulatory Visit: Payer: Self-pay | Admitting: Internal Medicine

## 2013-05-26 ENCOUNTER — Telehealth: Payer: Self-pay | Admitting: Internal Medicine

## 2013-05-26 NOTE — Telephone Encounter (Signed)
Pt had already called office before speaking with CAN. Per Dr. Dan Humphreys, appt was offered for 05/30/13 or 05/31/13, pt accepted appt for 05/31/13.

## 2013-05-26 NOTE — Telephone Encounter (Signed)
Patient Information:  Caller Name: Brittany Brooks  Phone: [phone]  Patient: Brittany Brooks, Brittany Brooks  Gender: Female  DOB: 10/24/1965  Age: 47 Years  PCP: Brittany Brooks (Adults only)  Pregnant: No  Office Follow Up:  Does the office need to follow up with this patient?: No  Instructions For The Office: N/A  RN Note:  Dizziness, lightheaded for past 2 weeks she related to cold symptoms.  Seen by ENT 05/26/13; positional vertigo test negative. Pulse is regular and not rapid. Called Brittany Brooks/office for ED disposition when office is open for dizziness/lightheaded with episode of palpitations this morning.  Brittany Brooks said Brittany Brooks palpitation event was discussed with Brittany Brooks who said to keep apptointment for 05/21/13, the soonest the MD can see her; may go to ED for acute symptoms.  Advised to go to [facility] ED now.  Symptoms  Reason For Call & Symptoms: Emergent call: chest feels heavy "related to feeling tired."  Called 911 05/26/13 at 0400 for palpitations lasting 10-15 minutes.  Evaluated only by EMTs.  BP 160/90 P 104,  Declined to go to ED.  Transferred for triage after follow up appointment with Brittany Brittany Brooks scheduled by staff for 0845 05/01/13.  Reviewed Health History In EMR: Yes  Reviewed Medications In EMR: Yes  Reviewed Allergies In EMR: Yes  Reviewed Surgeries / Procedures: Yes  Date of Onset of Symptoms: 05/26/2013 OB / GYN:  LMP: 05/08/2013  Guideline(s) Used:  Heart Rate and Heartbeat Questions  Disposition Per Guideline:   Go to ED Now  Reason For Disposition Reached:   Dizziness, lightheadedness, or weakness  Advice Given:  N/A  Patient Will Follow Care Advice:  YES

## 2013-05-31 ENCOUNTER — Ambulatory Visit: Payer: BC Managed Care – PPO | Admitting: Internal Medicine

## 2013-06-01 ENCOUNTER — Ambulatory Visit: Payer: BC Managed Care – PPO | Admitting: Internal Medicine

## 2013-06-14 ENCOUNTER — Telehealth: Payer: Self-pay | Admitting: Internal Medicine

## 2013-06-14 DIAGNOSIS — E669 Obesity, unspecified: Secondary | ICD-10-CM

## 2013-06-14 DIAGNOSIS — R5381 Other malaise: Secondary | ICD-10-CM

## 2013-06-14 DIAGNOSIS — R5383 Other fatigue: Secondary | ICD-10-CM

## 2013-06-14 DIAGNOSIS — E559 Vitamin D deficiency, unspecified: Secondary | ICD-10-CM

## 2013-06-14 DIAGNOSIS — E042 Nontoxic multinodular goiter: Secondary | ICD-10-CM

## 2013-06-14 DIAGNOSIS — E785 Hyperlipidemia, unspecified: Secondary | ICD-10-CM

## 2013-06-14 NOTE — Telephone Encounter (Signed)
The patient is asking to have a full blood panel done, before she comes in for a follow up. She saw a cardiologist and he saw that her thyroid panel was elevated from when it was last done by Dr. Darrick Huntsmanullo and he thought it would be best if she had a full blood panel done at this office.

## 2013-06-15 NOTE — Telephone Encounter (Signed)
Fasting labs ordered.,

## 2013-06-15 NOTE — Telephone Encounter (Signed)
Patient has an appointment scheduled with you on 07/01/13, but would like labs prior for her thyroid per cardiologist's suggestion please advise? Last TSH here 4/14.

## 2013-06-16 NOTE — Telephone Encounter (Signed)
Patient is scheduled for lab work for 06/17/2013.

## 2013-06-16 NOTE — Telephone Encounter (Signed)
Please call to schedule her a fasting lab appointment

## 2013-06-17 ENCOUNTER — Other Ambulatory Visit (INDEPENDENT_AMBULATORY_CARE_PROVIDER_SITE_OTHER): Payer: BC Managed Care – PPO

## 2013-06-17 DIAGNOSIS — E042 Nontoxic multinodular goiter: Secondary | ICD-10-CM

## 2013-06-17 DIAGNOSIS — R5381 Other malaise: Secondary | ICD-10-CM

## 2013-06-17 DIAGNOSIS — E785 Hyperlipidemia, unspecified: Secondary | ICD-10-CM

## 2013-06-17 DIAGNOSIS — E559 Vitamin D deficiency, unspecified: Secondary | ICD-10-CM

## 2013-06-17 DIAGNOSIS — R5383 Other fatigue: Secondary | ICD-10-CM

## 2013-06-17 DIAGNOSIS — E039 Hypothyroidism, unspecified: Secondary | ICD-10-CM

## 2013-06-17 LAB — CBC WITH DIFFERENTIAL/PLATELET
Basophils Absolute: 0 10*3/uL (ref 0.0–0.1)
Basophils Relative: 0.5 % (ref 0.0–3.0)
Eosinophils Absolute: 0.3 10*3/uL (ref 0.0–0.7)
Eosinophils Relative: 3.2 % (ref 0.0–5.0)
HCT: 35.5 % — ABNORMAL LOW (ref 36.0–46.0)
Hemoglobin: 12 g/dL (ref 12.0–15.0)
LYMPHS PCT: 19.2 % (ref 12.0–46.0)
Lymphs Abs: 1.6 10*3/uL (ref 0.7–4.0)
MCHC: 33.7 g/dL (ref 30.0–36.0)
MCV: 79.9 fl (ref 78.0–100.0)
Monocytes Absolute: 0.6 10*3/uL (ref 0.1–1.0)
Monocytes Relative: 7.8 % (ref 3.0–12.0)
NEUTROS PCT: 69.3 % (ref 43.0–77.0)
Neutro Abs: 5.6 10*3/uL (ref 1.4–7.7)
PLATELETS: 302 10*3/uL (ref 150.0–400.0)
RBC: 4.45 Mil/uL (ref 3.87–5.11)
RDW: 13.4 % (ref 11.5–14.6)
WBC: 8.1 10*3/uL (ref 4.5–10.5)

## 2013-06-17 LAB — COMPREHENSIVE METABOLIC PANEL
ALK PHOS: 51 U/L (ref 39–117)
ALT: 27 U/L (ref 0–35)
AST: 27 U/L (ref 0–37)
Albumin: 3.8 g/dL (ref 3.5–5.2)
BUN: 10 mg/dL (ref 6–23)
CALCIUM: 8.7 mg/dL (ref 8.4–10.5)
CHLORIDE: 106 meq/L (ref 96–112)
CO2: 26 mEq/L (ref 19–32)
Creatinine, Ser: 0.8 mg/dL (ref 0.4–1.2)
GFR: 88.77 mL/min (ref >60.00)
Glucose, Bld: 96 mg/dL (ref 70–99)
Potassium: 4.1 mEq/L (ref 3.5–5.1)
SODIUM: 138 meq/L (ref 135–145)
Total Bilirubin: 0.4 mg/dL (ref 0.3–1.2)
Total Protein: 7.2 g/dL (ref 6.0–8.3)

## 2013-06-17 LAB — LIPID PANEL
CHOL/HDL RATIO: 8
Cholesterol: 241 mg/dL — ABNORMAL HIGH (ref 0–200)
HDL: 31.2 mg/dL — ABNORMAL LOW (ref >39.00)
Triglycerides: 295 mg/dL — ABNORMAL HIGH (ref 0.0–149.0)
VLDL: 59 mg/dL — ABNORMAL HIGH (ref 0.0–40.0)

## 2013-06-17 LAB — LDL CHOLESTEROL, DIRECT: Direct LDL: 135.8 mg/dL

## 2013-06-18 LAB — VITAMIN D 25 HYDROXY (VIT D DEFICIENCY, FRACTURES): VIT D 25 HYDROXY: 27 ng/mL — AB (ref 30–89)

## 2013-06-20 ENCOUNTER — Encounter: Payer: Self-pay | Admitting: *Deleted

## 2013-06-21 LAB — TSH+FREE T4
Free T4: 0.96 ng/dL (ref 0.82–1.77)
TSH: 5.06 u[IU]/mL — AB (ref 0.450–4.500)

## 2013-06-21 MED ORDER — LEVOTHYROXINE SODIUM 125 MCG PO TABS: ORAL_TABLET | ORAL | Status: DC

## 2013-06-21 NOTE — Addendum Note (Signed)
Addended by: Sherlene ShamsULLO, Demir Titsworth L on: 06/21/2013 10:09 PM   Modules accepted: Orders

## 2013-06-21 NOTE — Assessment & Plan Note (Signed)
TSH was > 5 on 112 mcg dose.  Increased jan 13

## 2013-07-01 ENCOUNTER — Encounter: Payer: Self-pay | Admitting: Internal Medicine

## 2013-07-01 ENCOUNTER — Ambulatory Visit (INDEPENDENT_AMBULATORY_CARE_PROVIDER_SITE_OTHER): Payer: BC Managed Care – PPO | Admitting: Internal Medicine

## 2013-07-01 VITALS — BP 124/72 | HR 84 | Temp 98.6°F | Resp 16 | Wt 234.0 lb

## 2013-07-01 DIAGNOSIS — E782 Mixed hyperlipidemia: Secondary | ICD-10-CM

## 2013-07-01 DIAGNOSIS — K859 Acute pancreatitis without necrosis or infection, unspecified: Secondary | ICD-10-CM

## 2013-07-01 DIAGNOSIS — E039 Hypothyroidism, unspecified: Secondary | ICD-10-CM

## 2013-07-01 DIAGNOSIS — I499 Cardiac arrhythmia, unspecified: Secondary | ICD-10-CM

## 2013-07-01 DIAGNOSIS — E669 Obesity, unspecified: Secondary | ICD-10-CM

## 2013-07-01 DIAGNOSIS — E282 Polycystic ovarian syndrome: Secondary | ICD-10-CM

## 2013-07-01 MED ORDER — ALPRAZOLAM 0.25 MG PO TABS: 0.2500 mg | ORAL_TABLET | Freq: Every evening | ORAL | Status: DC | PRN

## 2013-07-01 NOTE — Progress Notes (Signed)
Patient ID: Brittany Brooks, female   DOB: May 07, 1966, 48 y.o.   MRN: [id number]    Patient Active Problem List   Diagnosis Date Noted  . Arrhythmia 07/03/2013  . Cervical spine ankylosis 07/22/2012  . Polycystic ovarian syndrome 04/29/2012  . Obesity (BMI 30-39.9) 09/28/2011  . Unspecified hypothyroidism 09/28/2011  . Major depressive disorder, recurrent episode, unspecified 09/28/2011  . Multinodular goiter (nontoxic)   . Mixed dyslipidemia   . History of pancreatitis     Subjective:  CC:   Chief Complaint  Patient presents with  . Follow-up    labs and thyroid    HPI:   Brittany Brooks a 48 y.o. female who presents Followup on multiple issues. Last seen February 2014  She had an episode of arrhythmia dec 18th , woke up with racing heart, quivering , pre syncopal,  Called EMT but by the time EMT arrived,  Rhythm was normal. Fath did a 3 day monitor,  Normal.  ECHO normal.  No caffeine use that day ,  But had used a decongestant and mucinex  For upper respiratory infection symptoms accompanied by vertigo several days prior to the episode. She was subsequently seen by ear nose and throat ruled out benign positional vertigo. She then developed flulike symptoms and was treated for flu on December 24.   Jan 18th  Had a viral GI flu  Vomiting for 10 hrs with diarrhea .  3 days miserable lost 6 lbs.  Fevers.    Past Medical History  Diagnosis Date  . Hypothyroidism     since early 20's, secondary to Hashimoto's  . Multinodular goiter (nontoxic)     biopsy negativre by [facility]  . Hyperlipidemia   . Pancreatitis 2000    secondary to cholangiogram during lap chole, Sankar    Past Surgical History  Procedure Laterality Date  . Cholecystectomy  2000    recurrent cholecystitis during pregnancy  . Cesarean section  Sept 2000       The following portions of the patient's history were reviewed and updated as appropriate: Allergies, current medications, and problem  list.    Review of Systems:   12 Pt  review of systems was negative except those addressed in the HPI,     History   Social History  . Marital Status: Married    Spouse Name: N/A    Number of Children: N/A  . Years of Education: N/A   Occupational History  . Not on file.   Social History Main Topics  . Smoking status: Never Smoker   . Smokeless tobacco: Never Used  . Alcohol Use: 2.4 oz/week    4 Glasses of wine per week  . Drug Use: No  . Sexual Activity: Not on file   Other Topics Concern  . Not on file   Social History Narrative  . No narrative on file    Objective:  Filed Vitals:   07/01/13 1409  BP: 124/72  Pulse: 84  Temp: 98.6 F (37 C)  Resp: 16     General appearance: alert, cooperative and appears stated age Ears: normal TM's and external ear canals both ears Throat: lips, mucosa, and tongue normal; teeth and gums normal Neck: no adenopathy, no carotid bruit, supple, symmetrical, trachea midline and thyroid not enlarged, symmetric, no tenderness/mass/nodules Back: symmetric, no curvature. ROM normal. No CVA tenderness. Lungs: clear to auscultation bilaterally Heart: regular rate and rhythm, S1, S2 normal, no murmur, click, rub or gallop Abdomen: soft, non-tender; bowel sounds normal; no  masses,  no organomegaly Pulses: 2+ and symmetric Skin: Skin color, texture, turgor normal. No rashes or lesions Lymph nodes: Cervical, supraclavicular, and axillary nodes normal.  Assessment and Plan:  Arrhythmia By history the episode sounds like SVT. Have taught patient how to measure her pulse. She continues to have episodes she will need a loop monitor versus CardioNet for longer period of monitoring. Continue to avoid caffeine.  Mixed dyslipidemia HDL is < 30, trgis are improved at 295 and LDl  Is normal at 136 .  Low glycemic diet recommended  Lab Results  Component Value Date   CHOL 241* 06/17/2013   HDL 31.20* 06/17/2013   LDLDIRECT 135.8 06/17/2013    TRIG 295.0* 06/17/2013   CHOLHDL 8 06/17/2013      Unspecified hypothyroidism She is due for repeat thyroid level in early March.  Polycystic ovarian syndrome Clinically given her history of obesity recent menstrual irregularity, and metabolic syndrome. Low glycemic index diet and weight loss recommended.  Obesity (BMI 30-39.9) I have congratulated her in reduction of   BMI and encouraged  Continued weight loss with goal of 10% of body weight over the next 6 months using a low glycemic index diet and regular exercise a minimum of 5 days per week.     Updated Medication List Outpatient Encounter Prescriptions as of 07/01/2013  Medication Sig  . ALPRAZolam (XANAX) 0.25 MG tablet Take 1 tablet (0.25 mg total) by mouth at bedtime as needed.  . Flaxseed, Linseed, (FLAX SEED OIL) 1000 MG CAPS Take 1,000 mg by mouth daily.  Marland Kitchen. levothyroxine (SYNTHROID, LEVOTHROID) 125 MCG tablet TAKE 1 TABLET (125 MCG TOTAL) BY MOUTH DAILY.  . Multiple Vitamin (MULTIVITAMIN) tablet Take 1 tablet by mouth daily.  . [DISCONTINUED] ALPRAZolam (XANAX) 0.25 MG tablet Take 1 tablet (0.25 mg total) by mouth at bedtime as needed.  . [DISCONTINUED] Cephalexin 500 MG tablet Take 1 tablet (500 mg total) by mouth 4 (four) times daily.  . [DISCONTINUED] citalopram (CELEXA) 10 MG tablet Take 1 tablet (10 mg total) by mouth daily.  . [DISCONTINUED] HYDROcodone-acetaminophen (NORCO/VICODIN) 5-325 MG per tablet Take 1 tablet by mouth every 6 (six) hours as needed for pain.  . [DISCONTINUED] predniSONE (STERAPRED UNI-PAK) 10 MG tablet 6 tablets on Day 1 , then reduce by 1 tablet daily until gone  . [DISCONTINUED] traMADol (ULTRAM) 50 MG tablet Take 1 tablet (50 mg total) by mouth every 6 (six) hours as needed for pain.     No orders of the defined types were placed in this encounter.    No Follow-up on file.

## 2013-07-01 NOTE — Patient Instructions (Addendum)
2000 IU vit D3 daily for 3 months  then 1000 IU rest of the year   Culturelle  Daily for GI system  We will repeat your TSH in early/mid May

## 2013-07-03 ENCOUNTER — Encounter: Payer: Self-pay | Admitting: Internal Medicine

## 2013-07-03 DIAGNOSIS — I499 Cardiac arrhythmia, unspecified: Secondary | ICD-10-CM | POA: Insufficient documentation

## 2013-07-03 NOTE — Assessment & Plan Note (Signed)
She is due for repeat thyroid level in early March.

## 2013-07-03 NOTE — Assessment & Plan Note (Signed)
By history the episode sounds like SVT. Have taught patient how to measure her pulse. She continues to have episodes she will need a loop monitor versus CardioNet for longer period of monitoring. Continue to avoid caffeine.

## 2013-07-03 NOTE — Assessment & Plan Note (Signed)
I have congratulated her in reduction of   BMI and encouraged  Continued weight loss with goal of 10% of body weight over the next 6 months using a low glycemic index diet and regular exercise a minimum of 5 days per week.     

## 2013-07-03 NOTE — Assessment & Plan Note (Signed)
HDL is < 30, trgis are improved at 295 and LDl  Is normal at 136 .  Low glycemic diet recommended  Lab Results  Component Value Date   CHOL 241* 06/17/2013   HDL 31.20* 06/17/2013   LDLDIRECT 135.8 06/17/2013   TRIG 295.0* 06/17/2013   CHOLHDL 8 06/17/2013

## 2013-07-03 NOTE — Assessment & Plan Note (Signed)
Clinically given her history of obesity recent menstrual irregularity, and metabolic syndrome. Low glycemic index diet and weight loss recommended.

## 2013-08-15 ENCOUNTER — Telehealth: Payer: Self-pay | Admitting: *Deleted

## 2013-08-15 DIAGNOSIS — R5381 Other malaise: Secondary | ICD-10-CM

## 2013-08-15 DIAGNOSIS — Z79899 Other long term (current) drug therapy: Secondary | ICD-10-CM

## 2013-08-15 DIAGNOSIS — E785 Hyperlipidemia, unspecified: Secondary | ICD-10-CM

## 2013-08-15 DIAGNOSIS — E559 Vitamin D deficiency, unspecified: Secondary | ICD-10-CM

## 2013-08-15 DIAGNOSIS — R5383 Other fatigue: Secondary | ICD-10-CM

## 2013-08-15 DIAGNOSIS — E039 Hypothyroidism, unspecified: Secondary | ICD-10-CM

## 2013-08-15 NOTE — Telephone Encounter (Signed)
Pt is coming in for labs tomorrow what labs and dx?  

## 2013-08-16 ENCOUNTER — Other Ambulatory Visit (INDEPENDENT_AMBULATORY_CARE_PROVIDER_SITE_OTHER): Payer: BC Managed Care – PPO

## 2013-08-16 DIAGNOSIS — E039 Hypothyroidism, unspecified: Secondary | ICD-10-CM

## 2013-08-16 DIAGNOSIS — R5383 Other fatigue: Secondary | ICD-10-CM

## 2013-08-16 DIAGNOSIS — R5381 Other malaise: Secondary | ICD-10-CM

## 2013-08-16 DIAGNOSIS — E559 Vitamin D deficiency, unspecified: Secondary | ICD-10-CM

## 2013-08-16 DIAGNOSIS — E785 Hyperlipidemia, unspecified: Secondary | ICD-10-CM

## 2013-08-16 DIAGNOSIS — Z79899 Other long term (current) drug therapy: Secondary | ICD-10-CM

## 2013-08-17 LAB — COMPREHENSIVE METABOLIC PANEL
ALT: 26 U/L (ref 0–35)
AST: 23 U/L (ref 0–37)
Albumin: 4 g/dL (ref 3.5–5.2)
Alkaline Phosphatase: 55 U/L (ref 39–117)
BILIRUBIN TOTAL: 0.4 mg/dL (ref 0.3–1.2)
BUN: 13 mg/dL (ref 6–23)
CO2: 26 mEq/L (ref 19–32)
Calcium: 8.8 mg/dL (ref 8.4–10.5)
Chloride: 101 mEq/L (ref 96–112)
Creatinine, Ser: 0.8 mg/dL (ref 0.4–1.2)
GFR: 82.34 mL/min (ref >60.00)
GLUCOSE: 95 mg/dL (ref 70–99)
Potassium: 4.1 mEq/L (ref 3.5–5.1)
SODIUM: 136 meq/L (ref 135–145)
TOTAL PROTEIN: 7.4 g/dL (ref 6.0–8.3)

## 2013-08-17 LAB — CBC WITH DIFFERENTIAL/PLATELET
BASOS PCT: 0.3 % (ref 0.0–3.0)
Basophils Absolute: 0 10*3/uL (ref 0.0–0.1)
EOS PCT: 2.7 % (ref 0.0–5.0)
Eosinophils Absolute: 0.2 10*3/uL (ref 0.0–0.7)
HEMATOCRIT: 35 % — AB (ref 36.0–46.0)
Hemoglobin: 11.8 g/dL — ABNORMAL LOW (ref 12.0–15.0)
Lymphocytes Relative: 24.6 % (ref 12.0–46.0)
Lymphs Abs: 2.3 10*3/uL (ref 0.7–4.0)
MCHC: 33.6 g/dL (ref 30.0–36.0)
MCV: 80.3 fl (ref 78.0–100.0)
MONO ABS: 0.6 10*3/uL (ref 0.1–1.0)
Monocytes Relative: 7 % (ref 3.0–12.0)
Neutro Abs: 6.1 10*3/uL (ref 1.4–7.7)
Neutrophils Relative %: 65.4 % (ref 43.0–77.0)
PLATELETS: 315 10*3/uL (ref 150.0–400.0)
RBC: 4.36 Mil/uL (ref 3.87–5.11)
RDW: 13.7 % (ref 11.5–14.6)
WBC: 9.3 10*3/uL (ref 4.5–10.5)

## 2013-08-17 LAB — TSH: TSH: 1.86 u[IU]/mL (ref 0.35–5.50)

## 2013-08-17 LAB — LIPID PANEL
Cholesterol: 266 mg/dL — ABNORMAL HIGH (ref 0–200)
HDL: 30.1 mg/dL — ABNORMAL LOW (ref >39.00)
LDL CALC: 139 mg/dL — AB (ref 0–99)
Total CHOL/HDL Ratio: 9
Triglycerides: 487 mg/dL — ABNORMAL HIGH (ref 0.0–149.0)
VLDL: 97.4 mg/dL — AB (ref 0.0–40.0)

## 2013-08-17 LAB — VITAMIN D 25 HYDROXY (VIT D DEFICIENCY, FRACTURES): Vit D, 25-Hydroxy: 31 ng/mL (ref 30–89)

## 2013-08-18 ENCOUNTER — Encounter: Payer: Self-pay | Admitting: *Deleted

## 2013-10-17 ENCOUNTER — Other Ambulatory Visit: Payer: Self-pay | Admitting: *Deleted

## 2013-10-17 DIAGNOSIS — E039 Hypothyroidism, unspecified: Secondary | ICD-10-CM

## 2013-10-17 MED ORDER — LEVOTHYROXINE SODIUM 125 MCG PO TABS: ORAL_TABLET | ORAL | Status: DC

## 2014-01-24 ENCOUNTER — Ambulatory Visit: Payer: Self-pay | Admitting: Obstetrics and Gynecology

## 2014-03-31 ENCOUNTER — Ambulatory Visit: Payer: Self-pay | Admitting: Orthopedic Surgery

## 2014-04-11 ENCOUNTER — Ambulatory Visit (INDEPENDENT_AMBULATORY_CARE_PROVIDER_SITE_OTHER): Payer: BC Managed Care – PPO | Admitting: *Deleted

## 2014-04-11 ENCOUNTER — Ambulatory Visit: Payer: BC Managed Care – PPO

## 2014-04-11 DIAGNOSIS — Z23 Encounter for immunization: Secondary | ICD-10-CM

## 2014-05-19 ENCOUNTER — Telehealth: Payer: Self-pay

## 2014-05-19 NOTE — Telephone Encounter (Signed)
Tried calling patient back to schedule, no answer.  Voice mail is full.

## 2014-05-19 NOTE — Telephone Encounter (Signed)
Yes, you can add her at 4:00

## 2014-05-19 NOTE — Telephone Encounter (Signed)
The patient called and has chest congestion.  She is hoping for a "fast track" apt today.  Do you want her worked in?

## 2014-09-14 ENCOUNTER — Telehealth: Payer: Self-pay | Admitting: *Deleted

## 2014-09-14 DIAGNOSIS — E669 Obesity, unspecified: Secondary | ICD-10-CM

## 2014-09-14 DIAGNOSIS — Z1159 Encounter for screening for other viral diseases: Secondary | ICD-10-CM

## 2014-09-14 DIAGNOSIS — E030 Congenital hypothyroidism with diffuse goiter: Secondary | ICD-10-CM

## 2014-09-14 DIAGNOSIS — E782 Mixed hyperlipidemia: Secondary | ICD-10-CM

## 2014-09-14 NOTE — Telephone Encounter (Signed)
Pt coming tomorrow what labs and dx? 

## 2014-09-15 ENCOUNTER — Other Ambulatory Visit (INDEPENDENT_AMBULATORY_CARE_PROVIDER_SITE_OTHER): Payer: BLUE CROSS/BLUE SHIELD

## 2014-09-15 DIAGNOSIS — E669 Obesity, unspecified: Secondary | ICD-10-CM | POA: Diagnosis not present

## 2014-09-15 DIAGNOSIS — Z1159 Encounter for screening for other viral diseases: Secondary | ICD-10-CM

## 2014-09-15 DIAGNOSIS — E782 Mixed hyperlipidemia: Secondary | ICD-10-CM

## 2014-09-15 DIAGNOSIS — E030 Congenital hypothyroidism with diffuse goiter: Secondary | ICD-10-CM | POA: Diagnosis not present

## 2014-09-15 LAB — LIPID PANEL
Cholesterol: 272 mg/dL — ABNORMAL HIGH (ref 0–200)
HDL: 39.9 mg/dL (ref >39.00)
NonHDL: 232.1
TRIGLYCERIDES: 283 mg/dL — AB (ref 0.0–149.0)
Total CHOL/HDL Ratio: 7
VLDL: 56.6 mg/dL — ABNORMAL HIGH (ref 0.0–40.0)

## 2014-09-15 LAB — COMPREHENSIVE METABOLIC PANEL
ALK PHOS: 53 U/L (ref 39–117)
ALT: 15 U/L (ref 0–35)
AST: 16 U/L (ref 0–37)
Albumin: 4.2 g/dL (ref 3.5–5.2)
BUN: 14 mg/dL (ref 6–23)
CO2: 22 meq/L (ref 19–32)
CREATININE: 0.73 mg/dL (ref 0.40–1.20)
Calcium: 9.5 mg/dL (ref 8.4–10.5)
Chloride: 106 mEq/L (ref 96–112)
GFR: 91.08 mL/min (ref >60.00)
Glucose, Bld: 98 mg/dL (ref 70–99)
Potassium: 4.8 mEq/L (ref 3.5–5.1)
SODIUM: 139 meq/L (ref 135–145)
TOTAL PROTEIN: 7.6 g/dL (ref 6.0–8.3)
Total Bilirubin: 0.5 mg/dL (ref 0.2–1.2)

## 2014-09-15 LAB — TSH: TSH: 2.44 u[IU]/mL (ref 0.35–4.50)

## 2014-09-15 LAB — LDL CHOLESTEROL, DIRECT: Direct LDL: 166 mg/dL

## 2014-09-16 LAB — HEPATITIS C ANTIBODY: HCV AB: NEGATIVE

## 2014-09-19 ENCOUNTER — Encounter: Payer: Self-pay | Admitting: *Deleted

## 2014-09-21 ENCOUNTER — Ambulatory Visit (INDEPENDENT_AMBULATORY_CARE_PROVIDER_SITE_OTHER): Payer: BLUE CROSS/BLUE SHIELD | Admitting: Internal Medicine

## 2014-09-21 ENCOUNTER — Encounter: Payer: Self-pay | Admitting: Internal Medicine

## 2014-09-21 VITALS — BP 138/90 | HR 89 | Temp 98.5°F | Resp 16 | Ht 66.0 in | Wt 241.0 lb

## 2014-09-21 DIAGNOSIS — R059 Cough, unspecified: Secondary | ICD-10-CM

## 2014-09-21 DIAGNOSIS — E042 Nontoxic multinodular goiter: Secondary | ICD-10-CM

## 2014-09-21 DIAGNOSIS — R05 Cough: Secondary | ICD-10-CM

## 2014-09-21 DIAGNOSIS — E030 Congenital hypothyroidism with diffuse goiter: Secondary | ICD-10-CM

## 2014-09-21 DIAGNOSIS — E669 Obesity, unspecified: Secondary | ICD-10-CM | POA: Diagnosis not present

## 2014-09-21 DIAGNOSIS — E782 Mixed hyperlipidemia: Secondary | ICD-10-CM

## 2014-09-21 DIAGNOSIS — I10 Essential (primary) hypertension: Secondary | ICD-10-CM

## 2014-09-21 MED ORDER — PHENTERMINE HCL 37.5 MG PO TABS: ORAL_TABLET | ORAL | Status: DC

## 2014-09-21 MED ORDER — BENZONATATE 100 MG PO CAPS: 100.0000 mg | ORAL_CAPSULE | Freq: Three times a day (TID) | ORAL | Status: DC

## 2014-09-21 MED ORDER — ALPRAZOLAM 0.25 MG PO TABS: 0.2500 mg | ORAL_TABLET | Freq: Every evening | ORAL | Status: DC | PRN

## 2014-09-21 MED ORDER — HYDROCHLOROTHIAZIDE 25 MG PO TABS: 25.0000 mg | ORAL_TABLET | Freq: Every day | ORAL | Status: DC

## 2014-09-21 NOTE — Patient Instructions (Addendum)
Take the zyrtec daily at night  Try naso cort Steroid spray as well   Flush sinuses  With [facility]  Tsessalon perles for cough    You have stage 1 hypertension:  Starting hctz one tablet daily in am for BP     Do not start phentermine until   BP is < 130/80   I have authorized the use of phentermine for 3 months.  Please have your vital signs checked a week after starting, and return to see me in 3 months. You can Take 1`/2 tablet in the morning,  The second half by 2 PM to avoid insomnia,  Or the whole tablet in the morning.   If you have not lost   12 lb  by the end of the  3 months we will have to discontinue it.

## 2014-09-21 NOTE — Progress Notes (Signed)
Patient ID: Brittany Brooks, female   DOB: 1965-11-09, 49 y.o.   MRN: [id number]     Patient Active Problem List   Diagnosis Date Noted  . Cough 09/24/2014  . Essential hypertension 09/24/2014  . Arrhythmia 07/03/2013  . Cervical spine ankylosis 07/22/2012  . Polycystic ovarian syndrome 04/29/2012  . Obesity (BMI 30-39.9) 09/28/2011  . Hypothyroidism 09/28/2011  . Major depressive disorder, recurrent episode, unspecified 09/28/2011  . Multinodular goiter (nontoxic)   . Mixed dyslipidemia   . History of pancreatitis     Subjective:  CC:   Chief Complaint  Patient presents with  . Follow-up    weight issues  . Hypothyroidism  . Hyperlipidemia    HPI:   Brittany Brooks is a 49 y.o. female who presents for Follow up on multiple issues.  Last seen January 2015.    1) persistent cough.  Every other day .  No worse no better for months.  Hs tried taking zyrtec to manage allergic rhinitis without change.  Wakes up with eyes feeling gummy.  Describes cough as croupy since fall. URI .  Treated by Willeen Cass in [place] with antibiotics.  Not smoking,  Not short of breath,  No history of asthma. ,   2) Obesity.  Frustrated at inability to lose weight,  Reports that she worked out 4 times per week over the winter,  Did not lose weight   3) Hypertension  4) Nocturia x 3 for years. No prior workup.     Past Medical History  Diagnosis Date  . Hypothyroidism     since early 20's, secondary to Hashimoto's  . Multinodular goiter (nontoxic)     biopsy negativre by [facility]  . Hyperlipidemia   . Pancreatitis 2000    secondary to cholangiogram during lap chole, Sankar    Past Surgical History  Procedure Laterality Date  . Cholecystectomy  2000    recurrent cholecystitis during pregnancy  . Cesarean section  Sept 2000       The following portions of the patient's history were reviewed and updated as appropriate: Allergies, current medications, and problem list.    Review of  Systems:   Patient denies headache, fevers, malaise, unintentional weight loss, skin rash, eye pain, sinus congestion and sinus pain, sore throat, dysphagia,  hemoptysis , cough, dyspnea, wheezing, chest pain, palpitations, orthopnea, edema, abdominal pain, nausea, melena, diarrhea, constipation, flank pain, dysuria, hematuria, urinary  Frequency, nocturia, numbness, tingling, seizures,  Focal weakness, Loss of consciousness,  Tremor, insomnia, depression, anxiety, and suicidal ideation.     History   Social History  . Marital Status: Married    Spouse Name: N/A  . Number of Children: N/A  . Years of Education: N/A   Occupational History  . Not on file.   Social History Main Topics  . Smoking status: Never Smoker   . Smokeless tobacco: Never Used  . Alcohol Use: 2.4 oz/week    4 Glasses of wine per week  . Drug Use: No  . Sexual Activity: Not on file   Other Topics Concern  . Not on file   Social History Narrative    Objective:  Filed Vitals:   09/21/14 1012  BP: 138/90  Pulse: 89  Temp: 98.5 F (36.9 C)  Resp: 16     General appearance: alert, cooperative and appears stated age Ears: normal TM's and external ear canals both ears Throat: lips, mucosa, and tongue normal; teeth and gums normal Neck: no adenopathy, no carotid bruit, supple,  symmetrical, trachea midline and thyroid not enlarged, symmetric, no tenderness/mass/nodules Back: symmetric, no curvature. ROM normal. No CVA tenderness. Lungs: clear to auscultation bilaterally Heart: regular rate and rhythm, S1, S2 normal, no murmur, click, rub or gallop Abdomen: soft, non-tender; bowel sounds normal; no masses,  no organomegaly Pulses: 2+ and symmetric Skin: Skin color, texture, turgor normal. No rashes or lesions Lymph nodes: Cervical, supraclavicular, and axillary nodes normal.  Assessment and Plan:  Cough Discussed the possible etiologies of persistent cough including but not limited to post nasal drip,   Allergic rhinitis,  Asthma,  GERD and chronic cyclic cough due to persistent irritation.  She is not taking and ACE Inhibitor.  Suggested treating for allergic rhinitis,  PND and GERD to break cycle and reevaluation in a few weeks.    Obesity (BMI 30-39.9) She has had difficulty losing weight due to increased appetite and is requesting a trial of  Phentermine.  She is aware of the possible side effects and risks and understands that  The medication should  not be started until hypertension has been controlled,  And will be discontinued if she has not lost 5% of her body weight over the next 3 months, which , based on today's weight is 12 lbs.   .BP 138/90 mmHg  Pulse 89  Temp(Src) 98.5 F (36.9 C) (Oral)  Resp 16  Ht 5\' 6"  (1.676 m)  Wt 241 lb (109.317 kg)  BMI 38.92 kg/m2  SpO2 97%  LMP 08/24/2014  Wt Readings from Last 3 Encounters:  09/21/14 241 lb (109.317 kg)  07/01/13 234 lb (106.142 kg)  07/20/12 245 lb (111.131 kg)     Multinodular goiter (nontoxic) Thyroid function is normal today.  No changes   Lab Results  Component Value Date   TSH 2.44 09/15/2014      Hypothyroidism Thyroid function is WNL on current dose.  No current changes needed.    Mixed dyslipidemia HDL is has improved but triglycerides remain > 250.  LOW glycemic diet recommended  Lab Results  Component Value Date   CHOL 272* 09/15/2014   HDL 39.90 09/15/2014   LDLCALC 139* 08/16/2013   LDLDIRECT 166.0 09/15/2014   TRIG 283.0* 09/15/2014   CHOLHDL 7 09/15/2014         Essential hypertension Adding HCTZ 25 MG TODAY.  RETURN IN 2 WEEKS for BP check    A total of 40 minutes was spent with patient more than half of which was spent in counseling patient on the above mentioned issues , reviewing and explaining recent labs and imaging studies done, and coordination of care.   Updated Medication List Outpatient Encounter Prescriptions as of 09/21/2014  Medication Sig  . ALPRAZolam (XANAX)  0.25 MG tablet Take 1 tablet (0.25 mg total) by mouth at bedtime as needed.  . Flaxseed, Linseed, (FLAX SEED OIL) 1000 MG CAPS Take 1,000 mg by mouth daily.  Marland Kitchen. levothyroxine (SYNTHROID, LEVOTHROID) 125 MCG tablet TAKE 1 TABLET (125 MCG TOTAL) BY MOUTH DAILY.  . Multiple Vitamin (MULTIVITAMIN) tablet Take 1 tablet by mouth daily.  . [DISCONTINUED] ALPRAZolam (XANAX) 0.25 MG tablet Take 1 tablet (0.25 mg total) by mouth at bedtime as needed.  . benzonatate (TESSALON) 100 MG capsule Take 1 capsule (100 mg total) by mouth 3 (three) times daily.  . hydrochlorothiazide (HYDRODIURIL) 25 MG tablet Take 1 tablet (25 mg total) by mouth daily.  . phentermine (ADIPEX-P) 37.5 MG tablet 1/2 tablet in the am and early afternoon     No  orders of the defined types were placed in this encounter.    No Follow-up on file.

## 2014-09-21 NOTE — Progress Notes (Signed)
Pre-visit discussion using our clinic review tool. No additional management support is needed unless otherwise documented below in the visit note.  

## 2014-09-24 DIAGNOSIS — R05 Cough: Secondary | ICD-10-CM | POA: Insufficient documentation

## 2014-09-24 DIAGNOSIS — I10 Essential (primary) hypertension: Secondary | ICD-10-CM | POA: Insufficient documentation

## 2014-09-24 DIAGNOSIS — R059 Cough, unspecified: Secondary | ICD-10-CM | POA: Insufficient documentation

## 2014-09-24 NOTE — Assessment & Plan Note (Signed)
Discussed the possible etiologies of persistent cough including but not limited to post nasal drip,  Allergic rhinitis,  Asthma,  GERD and chronic cyclic cough due to persistent irritation.  She is not taking and ACE Inhibitor.  Suggested treating for allergic rhinitis,  PND and GERD to break cycle and reevaluation in a few weeks.  

## 2014-09-24 NOTE — Assessment & Plan Note (Addendum)
Thyroid function is normal today.  No changes   Lab Results  Component Value Date   TSH 2.44 09/15/2014

## 2014-09-24 NOTE — Assessment & Plan Note (Signed)
Thyroid function is WNL on current dose.  No current changes needed.  

## 2014-09-24 NOTE — Assessment & Plan Note (Signed)
Adding HCTZ 25 MG TODAY.  RETURN IN 2 WEEKS for BP check

## 2014-09-24 NOTE — Assessment & Plan Note (Signed)
HDL is has improved but triglycerides remain > 250.  LOW glycemic diet recommended  Lab Results  Component Value Date   CHOL 272* 09/15/2014   HDL 39.90 09/15/2014   LDLCALC 139* 08/16/2013   LDLDIRECT 166.0 09/15/2014   TRIG 283.0* 09/15/2014   CHOLHDL 7 09/15/2014

## 2014-09-24 NOTE — Assessment & Plan Note (Signed)
She has had difficulty losing weight due to increased appetite and is requesting a trial of  Phentermine.  She is aware of the possible side effects and risks and understands that  The medication should  not be started until hypertension has been controlled,  And will be discontinued if she has not lost 5% of her body weight over the next 3 months, which , based on today's weight is 12 lbs.   .BP 138/90 mmHg  Pulse 89  Temp(Src) 98.5 F (36.9 C) (Oral)  Resp 16  Ht 5\' 6"  (1.676 m)  Wt 241 lb (109.317 kg)  BMI 38.92 kg/m2  SpO2 97%  LMP 08/24/2014  Wt Readings from Last 3 Encounters:  09/21/14 241 lb (109.317 kg)  07/01/13 234 lb (106.142 kg)  07/20/12 245 lb (111.131 kg)

## 2014-09-28 ENCOUNTER — Ambulatory Visit: Payer: BLUE CROSS/BLUE SHIELD

## 2014-09-28 VITALS — BP 138/78 | HR 85

## 2014-09-28 DIAGNOSIS — I10 Essential (primary) hypertension: Secondary | ICD-10-CM

## 2014-10-10 ENCOUNTER — Ambulatory Visit: Payer: BLUE CROSS/BLUE SHIELD | Admitting: *Deleted

## 2014-10-10 VITALS — BP 132/80 | HR 95

## 2014-10-10 DIAGNOSIS — I10 Essential (primary) hypertension: Secondary | ICD-10-CM

## 2014-10-10 NOTE — Progress Notes (Signed)
Pt presents for BP check. Doing well without complaints. Has not started Phentermine yet, was hoping to have BP decrease a little first. Taking HCTZ 25 mg daily without difficulty. BP 132/80, HR 95. Advised I would notify Dr. Darrick Huntsmanullo and we would call with any changes,  verbalized understanding

## 2014-10-17 ENCOUNTER — Other Ambulatory Visit: Payer: Self-pay | Admitting: Internal Medicine

## 2015-04-17 ENCOUNTER — Ambulatory Visit (INDEPENDENT_AMBULATORY_CARE_PROVIDER_SITE_OTHER): Payer: BLUE CROSS/BLUE SHIELD | Admitting: *Deleted

## 2015-04-17 DIAGNOSIS — Z23 Encounter for immunization: Secondary | ICD-10-CM

## 2015-05-17 ENCOUNTER — Ambulatory Visit (INDEPENDENT_AMBULATORY_CARE_PROVIDER_SITE_OTHER): Payer: BLUE CROSS/BLUE SHIELD | Admitting: Obstetrics and Gynecology

## 2015-05-17 ENCOUNTER — Encounter: Payer: Self-pay | Admitting: Obstetrics and Gynecology

## 2015-05-17 ENCOUNTER — Other Ambulatory Visit: Payer: Self-pay | Admitting: Obstetrics and Gynecology

## 2015-05-17 VITALS — BP 132/104 | HR 98 | Ht 65.0 in | Wt 246.5 lb

## 2015-05-17 DIAGNOSIS — F3281 Premenstrual dysphoric disorder: Secondary | ICD-10-CM

## 2015-05-17 DIAGNOSIS — Z01419 Encounter for gynecological examination (general) (routine) without abnormal findings: Secondary | ICD-10-CM

## 2015-05-17 DIAGNOSIS — E039 Hypothyroidism, unspecified: Secondary | ICD-10-CM

## 2015-05-17 DIAGNOSIS — I158 Other secondary hypertension: Secondary | ICD-10-CM | POA: Diagnosis not present

## 2015-05-17 MED ORDER — NORETHIN ACE-ETH ESTRAD-FE 1-20 MG-MCG(24) PO CAPS
1.0000 | ORAL_CAPSULE | Freq: Every morning | ORAL | Status: DC
Start: 2015-05-17 — End: 2015-08-24

## 2015-05-17 MED ORDER — ATENOLOL 25 MG PO TABS: 25.0000 mg | ORAL_TABLET | Freq: Every day | ORAL | Status: DC

## 2015-05-17 NOTE — Patient Instructions (Addendum)
Place annual gynecologic exam patient instructions here.  Hypertension Hypertension, commonly called high blood pressure, is when the force of blood pumping through your arteries is too strong. Your arteries are the blood vessels that carry blood from your heart throughout your body. A blood pressure reading consists of a higher number over a lower number, such as 110/72. The higher number (systolic) is the pressure inside your arteries when your heart pumps. The lower number (diastolic) is the pressure inside your arteries when your heart relaxes. Ideally you want your blood pressure below 120/80. Hypertension forces your heart to work harder to pump blood. Your arteries may become narrow or stiff. Having untreated or uncontrolled hypertension can cause heart attack, stroke, kidney disease, and other problems. RISK FACTORS Some risk factors for high blood pressure are controllable. Others are not.  Risk factors you cannot control include:   Race. You may be at higher risk if you are African American.  Age. Risk increases with age.  Gender. Men are at higher risk than women before age 57 years. After age 2, women are at higher risk than men. Risk factors you can control include:  Not getting enough exercise or physical activity.  Being overweight.  Getting too much fat, sugar, calories, or salt in your diet.  Drinking too much alcohol. SIGNS AND SYMPTOMS Hypertension does not usually cause signs or symptoms. Extremely high blood pressure (hypertensive crisis) may cause headache, anxiety, shortness of breath, and nosebleed. DIAGNOSIS To check if you have hypertension, your health care provider will measure your blood pressure while you are seated, with your arm held at the level of your heart. It should be measured at least twice using the same arm. Certain conditions can cause a difference in blood pressure between your right and left arms. A blood pressure reading that is higher than  normal on one occasion does not mean that you need treatment. If it is not clear whether you have high blood pressure, you may be asked to return on a different day to have your blood pressure checked again. Or, you may be asked to monitor your blood pressure at home for 1 or more weeks. TREATMENT Treating high blood pressure includes making lifestyle changes and possibly taking medicine. Living a healthy lifestyle can help lower high blood pressure. You may need to change some of your habits. Lifestyle changes may include:  Following the DASH diet. This diet is high in fruits, vegetables, and whole grains. It is low in salt, red meat, and added sugars.  Keep your sodium intake below 2,300 mg per day.  Getting at least 30-45 minutes of aerobic exercise at least 4 times per week.  Losing weight if necessary.  Not smoking.  Limiting alcoholic beverages.  Learning ways to reduce stress. Your health care provider may prescribe medicine if lifestyle changes are not enough to get your blood pressure under control, and if one of the following is true:  You are 76-24 years of age and your systolic blood pressure is above 140.  You are 34 years of age or older, and your systolic blood pressure is above 150.  Your diastolic blood pressure is above 90.  You have diabetes, and your systolic blood pressure is over 140 or your diastolic blood pressure is over 90.  You have kidney disease and your blood pressure is above 140/90.  You have heart disease and your blood pressure is above 140/90. Your personal target blood pressure may vary depending on your medical conditions,  your age, and other factors. HOME CARE INSTRUCTIONS  Have your blood pressure rechecked as directed by your health care provider.   Take medicines only as directed by your health care provider. Follow the directions carefully. Blood pressure medicines must be taken as prescribed. The medicine does not work as well when you  skip doses. Skipping doses also puts you at risk for problems.  Do not smoke.   Monitor your blood pressure at home as directed by your health care provider. SEEK MEDICAL CARE IF:   You think you are having a reaction to medicines taken.  You have recurrent headaches or feel dizzy.  You have swelling in your ankles.  You have trouble with your vision. SEEK IMMEDIATE MEDICAL CARE IF:  You develop a severe headache or confusion.  You have unusual weakness, numbness, or feel faint.  You have severe chest or abdominal pain.  You vomit repeatedly.  You have trouble breathing. MAKE SURE YOU:   Understand these instructions.  Will watch your condition.  Will get help right away if you are not doing well or get worse.   This information is not intended to replace advice given to you by your health care provider. Make sure you discuss any questions you have with your health care provider.   Document Released: 05/26/2005 Document Revised: 10/10/2014 Document Reviewed: 03/18/2013 Elsevier Interactive Patient Education 2016 Elsevier Inc.   Premenstrual Syndrome Premenstrual syndrome (PMS) is a condition that consists of physical, emotional, and behavioral symptoms that affect women of childbearing age. PMS occurs 5-14 days before the start of a menstrual period and often recurs in a predictable pattern. The symptoms go away a few days after the menstrual period starts. PMS can interfere in many ways with normal daily activities and can range from mild to severe. When PMS is considered severe, it may be diagnosed as premenstrual dysphoric disorder (PMDD). A small percentage of women are affected by PMS symptoms and an even smaller percentage of those women are affected by PMDD.  CAUSES  The exact cause of PMS is unknown, but it seems to be related to cyclic hormone changes that happen before menstruation. These hormones are thought to affect chemicals in the brain (serotonin) that can  influence a person's mood.  SYMPTOMS  Symptoms of PMS recur consistently from month to month and go away completely after the menstrual period starts. The most common emotional or behavioral symptom is mood swings. These mood swings can be disabling and interfere with normal activities of daily living. Other common symptoms include depression and angry outbursts. Other symptoms may include:   Irritability.  Anxiety.  Crying spells.   Food cravings or appetite changes.   Changes in sexual desire.   Confusion.   Aggression.   Social withdrawal.   Poor concentration. The most common physical symptoms include a sense of bloating, breast pain, headaches, and extreme fatigue. Other physical symptoms include:   Backaches.   Swelling of the hands and feet.   Weight gain.   Hot flashes.  DIAGNOSIS  To make a diagnosis, your caregiver will ask questions to confirm that you are having a pattern of symptoms. Symptoms must:   Be present 5 days before the start of your period and be present at least 3 months in a row.   End within 4 days after your period starts.   Interfere with some of your normal activities.  Other conditions, such as thyroid disease, depression, and migraine headaches must be ruled out before  a diagnosis of PMS is confirmed.  TREATMENT  Your caregiver may suggest ways to maintain a healthy lifestyle, such as exercise. Over-the-counter pain relievers may ease cramps, aches, pains, headaches, and breast tenderness. However, selective serotonin reuptake inhibitors (SSRIs) are medicines that are most beneficial in improving PMS if taken in the second half of the monthly cycle. They may be taken on a daily basis. The most effective oral contraceptive pill used for symptoms of PMS is one that contains the ingredient drospirenone. Taking 4 days off of the pill instead of the usual 7 days also has shown to increase effectiveness.  There are a number of drugs,  dietary supplements, vitamins, and water pills (diuretics) which have been suggested to be helpful but have not shown to be of any benefit to improving PMS symptoms.  HOME CARE INSTRUCTIONS   For 2-3 months, write down your symptoms, their severity, and how long they last. This may help your caregiver prescribe the best treatment for your symptoms.  Exercise regularly as suggested by your caregiver.  Eat a regular, well-balanced diet.  Avoid caffeine, alcohol, and tobacco consumption.  Limit salt and salty foods to lessen bloating and fluid retention.  Get enough sleep. Practice relaxation techniques.  Drink enough fluids to keep your urine clear or pale yellow.  Take medicines as directed by your caregiver.  Limit stress.  Take a multivitamin as directed by your caregiver.   This information is not intended to replace advice given to you by your health care provider. Make sure you discuss any questions you have with your health care provider.   Document Released: 05/23/2000 Document Revised: 02/18/2012 Document Reviewed: 10/13/2011 Elsevier Interactive Patient Education [employer]2016 Elsevier Inc.

## 2015-05-17 NOTE — Progress Notes (Signed)
Subjective:   Brittany Brooks is a 49 y.o. G1P0 Caucasian female here for a routine well-woman exam.  Patient's last menstrual period was 05/12/2015 (exact date).    Current complaints: worsening PMDD over last 5 years now lasting two weeks before each menses, anxiety associated with it, mood swings, lower abdominal pressure and bloating. Also reports feeling like her blood pressure is up again, due to weight and stress. PCP: [name]       Does need labs  Social History: Sexual: heterosexual- but not sexually active with spouse in last 4-6 years by choice Marital Status: married Living situation: with spouse Occupation: works at [employer] Tobacco/alcohol: no tobacco use Illicit drugs: no history of illicit drug use  The following portions of the patient's history were reviewed and updated as appropriate: allergies, current medications, past family history, past medical history, past social history, past surgical history and problem list.  Past Medical History Past Medical History  Diagnosis Date  . Hypothyroidism     since early 20's, secondary to Hashimoto's  . Multinodular goiter (nontoxic)     biopsy negativre by [facility]  . Hyperlipidemia   . Pancreatitis 2000    secondary to cholangiogram during lap chole, Sankar    Past Surgical History Past Surgical History  Procedure Laterality Date  . Cholecystectomy  2000    recurrent cholecystitis during pregnancy  . Cesarean section  Sept 2000    Gynecologic History G1P0  Patient's last menstrual period was 05/12/2015 (exact date). Contraception: abstinence Last Pap: 2010. Results were: normal Last mammogram: 2014. Results were: normal  Obstetric History OB History  Gravida Para Term Preterm AB SAB TAB Ectopic Multiple Living  1         1    # Outcome Date GA Lbr Len/2nd Weight Sex Delivery Anes PTL Lv  1 Gravida 2000    M CS-Unspec   Y      Current Medications Current Outpatient Prescriptions on File Prior to Visit   Medication Sig Dispense Refill  . ALPRAZolam (XANAX) 0.25 MG tablet Take 1 tablet (0.25 mg total) by mouth at bedtime as needed. 30 tablet 5  . Flaxseed, Linseed, (FLAX SEED OIL) 1000 MG CAPS Take 1,000 mg by mouth daily.    Marland Kitchen levothyroxine (SYNTHROID, LEVOTHROID) 125 MCG tablet TAKE ONE TABLET BY MOUTH EVERY DAY 90 tablet 1  . Multiple Vitamin (MULTIVITAMIN) tablet Take 1 tablet by mouth daily.    . benzonatate (TESSALON) 100 MG capsule Take 1 capsule (100 mg total) by mouth 3 (three) times daily. (Patient not taking: Reported on 05/17/2015) 60 capsule 0  . hydrochlorothiazide (HYDRODIURIL) 25 MG tablet Take 1 tablet (25 mg total) by mouth daily. (Patient not taking: Reported on 05/17/2015) 90 tablet 3  . phentermine (ADIPEX-P) 37.5 MG tablet 1/2 tablet in the am and early afternoon (Patient not taking: Reported on 05/17/2015) 30 tablet 2   No current facility-administered medications on file prior to visit.    Review of Systems Patient denies any headaches, blurred vision, shortness of breath, chest pain, abdominal pain, problems with bowel movements, urination, or intercourse.  Objective:  BP 132/104 mmHg  Pulse 98  Ht  (1.651 m)  Wt 246 lb 8 oz (111.812 kg)  BMI 41.02 kg/m2  LMP 05/12/2015 (Exact Date) Physical Exam  General:  Well developed, well nourished, no acute distress. She is alert and oriented x3. Skin:  Warm and dry Neck:  Midline trachea, no thyromegaly or nodules Cardiovascular: Regular rate and rhythm, no  murmur heard Lungs:  Effort normal, all lung fields clear to auscultation bilaterally Breasts:  No dominant palpable mass, retraction, or nipple discharge Abdomen:  Soft, non tender, no hepatosplenomegaly or masses Pelvic:  External genitalia is normal in appearance.  The vagina is normal in appearance. The cervix is bulbous, no CMT.  Thin prep pap is done with HR HPV cotesting. Uterus is felt to be normal size, shape, and contour.  No adnexal masses or tenderness  noted. Extremities:  No swelling or varicosities noted Psych:  She has a normal mood and affect  Assessment:   Healthy well-woman exam Hypertension Thyroid disorder Obesity  PMDD   Plan:  Pap and labs obtained recommend lo dose OCP for PMMD treatment- Taytulla sample given and will send in rx if it is working well for her Started on 25mg  Atenolol daily, will stop by for BP check in one week. Recheck on both meds in 6-8 weeks F/U 1 year for AE, or sooner if needed Mammogram scheduled   Marylen Zuk Suzan NailerN Reeve Mallo, CNM

## 2015-05-18 ENCOUNTER — Encounter: Payer: Self-pay | Admitting: Obstetrics and Gynecology

## 2015-05-18 ENCOUNTER — Other Ambulatory Visit: Payer: Self-pay | Admitting: Obstetrics and Gynecology

## 2015-05-18 DIAGNOSIS — E559 Vitamin D deficiency, unspecified: Secondary | ICD-10-CM | POA: Insufficient documentation

## 2015-05-18 DIAGNOSIS — R7303 Prediabetes: Secondary | ICD-10-CM | POA: Insufficient documentation

## 2015-05-18 LAB — COMPREHENSIVE METABOLIC PANEL
ALBUMIN: 4.4 g/dL (ref 3.5–5.5)
ALT: 27 IU/L (ref 0–32)
AST: 21 IU/L (ref 0–40)
Albumin/Globulin Ratio: 1.6 (ref 1.1–2.5)
Alkaline Phosphatase: 59 IU/L (ref 39–117)
BILIRUBIN TOTAL: 0.2 mg/dL (ref 0.0–1.2)
BUN / CREAT RATIO: 15 (ref 9–23)
BUN: 13 mg/dL (ref 6–24)
CALCIUM: 9.3 mg/dL (ref 8.7–10.2)
CO2: 23 mmol/L (ref 18–29)
CREATININE: 0.89 mg/dL (ref 0.57–1.00)
Chloride: 99 mmol/L (ref 97–106)
GFR calc non Af Amer: 77 mL/min/{1.73_m2} (ref >59)
GFR, EST AFRICAN AMERICAN: 89 mL/min/{1.73_m2} (ref >59)
GLUCOSE: 89 mg/dL (ref 65–99)
Globulin, Total: 2.7 g/dL (ref 1.5–4.5)
Potassium: 4 mmol/L (ref 3.5–5.2)
Sodium: 140 mmol/L (ref 136–144)
TOTAL PROTEIN: 7.1 g/dL (ref 6.0–8.5)

## 2015-05-18 LAB — CBC
Hematocrit: 38.4 % (ref 34.0–46.6)
Hemoglobin: 12.6 g/dL (ref 11.1–15.9)
MCH: 26.4 pg — AB (ref 26.6–33.0)
MCHC: 32.8 g/dL (ref 31.5–35.7)
MCV: 80 fL (ref 79–97)
Platelets: 342 10*3/uL (ref 150–379)
RBC: 4.78 x10E6/uL (ref 3.77–5.28)
RDW: 14.2 % (ref 12.3–15.4)
WBC: 10.4 10*3/uL (ref 3.4–10.8)

## 2015-05-18 LAB — VITAMIN D 25 HYDROXY (VIT D DEFICIENCY, FRACTURES): VIT D 25 HYDROXY: 15.3 ng/mL — AB (ref 30.0–100.0)

## 2015-05-18 LAB — THYROID PANEL WITH TSH
Free Thyroxine Index: 2.9 (ref 1.2–4.9)
T3 Uptake Ratio: 28 % (ref 24–39)
T4 TOTAL: 10.2 ug/dL (ref 4.5–12.0)
TSH: 3.67 u[IU]/mL (ref 0.450–4.500)

## 2015-05-18 LAB — VITAMIN B12: VITAMIN B 12: 455 pg/mL (ref 211–946)

## 2015-05-18 LAB — HEMOGLOBIN A1C
Est. average glucose Bld gHb Est-mCnc: 128 mg/dL
Hgb A1c MFr Bld: 6.1 % — ABNORMAL HIGH (ref 4.8–5.6)

## 2015-05-18 MED ORDER — VITAMIN D (ERGOCALCIFEROL) 1.25 MG (50000 UNIT) PO CAPS: 50000.0000 [IU] | ORAL_CAPSULE | ORAL | Status: DC

## 2015-05-22 ENCOUNTER — Encounter: Payer: Self-pay | Admitting: Obstetrics and Gynecology

## 2015-05-22 LAB — CYTOLOGY - PAP

## 2015-05-23 NOTE — Telephone Encounter (Signed)
-----   Message from Purcell NailsMelody N Shambley, [place] sent at 05/18/2015  4:55 PM EST ----- Please mail info on Vit D def

## 2015-05-25 ENCOUNTER — Ambulatory Visit (INDEPENDENT_AMBULATORY_CARE_PROVIDER_SITE_OTHER): Payer: BLUE CROSS/BLUE SHIELD | Admitting: Obstetrics and Gynecology

## 2015-05-25 VITALS — BP 130/92 | HR 72

## 2015-05-25 DIAGNOSIS — I10 Essential (primary) hypertension: Secondary | ICD-10-CM

## 2015-05-25 NOTE — Progress Notes (Signed)
Patient ID: Brittany ReidKaren E Nelis, female   DOB: 11-Jun-1965, 49 y.o.   MRN: [id number] Pt presents for 1 wk f/u B/P check. B/P 129/99 (rt arm), B/P 130/92 (left arm). Has symptoms of weakness, feeling tired and at times tightness in chest when tired but goes away quickly (no c/o shortness of breathe). She thinks this may be anxiety related. Would like to know if she could take Xanax with this medication: Atenolol. MNS will have pt f/u for B/P check in 1 week. Also pt needs to see PCP with in next month to address B/P elevation. Pt declined having Atenolol increased due to it making her so tired. Pt informed it is okay to take Xanax.   Counseled patient on increasing BP meds, she doesn't want to as they make her feel bad. Ok to take xanax as needed. Encouraged her to be seen by PCP Dr Darrick Huntsmanullo ASAP for evaluation, EKG and assuming care of BP and meds.  She will call today and make appintment. To rtc in 1 week for another BP check.  Melody CommerceShambley, CNM

## 2015-06-14 ENCOUNTER — Other Ambulatory Visit: Payer: Self-pay | Admitting: Obstetrics and Gynecology

## 2015-06-14 ENCOUNTER — Other Ambulatory Visit: Payer: Self-pay | Admitting: Internal Medicine

## 2015-06-15 NOTE — Telephone Encounter (Signed)
Please advise? Was seen in December.

## 2015-06-15 NOTE — Telephone Encounter (Signed)
Ok to refill,  printed rx  

## 2015-06-29 ENCOUNTER — Encounter: Payer: Self-pay | Admitting: Internal Medicine

## 2015-06-29 DIAGNOSIS — E782 Mixed hyperlipidemia: Secondary | ICD-10-CM | POA: Insufficient documentation

## 2015-06-29 DIAGNOSIS — Z8719 Personal history of other diseases of the digestive system: Secondary | ICD-10-CM | POA: Insufficient documentation

## 2015-07-05 ENCOUNTER — Ambulatory Visit: Payer: BLUE CROSS/BLUE SHIELD | Admitting: Obstetrics and Gynecology

## 2015-07-31 DIAGNOSIS — R0789 Other chest pain: Secondary | ICD-10-CM | POA: Insufficient documentation

## 2015-08-06 ENCOUNTER — Other Ambulatory Visit: Payer: Self-pay | Admitting: Cardiology

## 2015-08-08 ENCOUNTER — Encounter
Admission: RE | Disposition: A | Discharge status: Home / Self Care | Payer: Self-pay | Source: Ambulatory Visit | Attending: Cardiology

## 2015-08-08 ENCOUNTER — Encounter: Payer: Self-pay | Admitting: *Deleted

## 2015-08-08 ENCOUNTER — Ambulatory Visit
Admission: RE | Admit: 2015-08-08 | Discharge: 2015-08-08 | Disposition: A | Discharge status: Home / Self Care | Payer: BLUE CROSS/BLUE SHIELD | Source: Ambulatory Visit | Attending: Cardiology | Admitting: Cardiology

## 2015-08-08 DIAGNOSIS — I499 Cardiac arrhythmia, unspecified: Secondary | ICD-10-CM | POA: Diagnosis not present

## 2015-08-08 DIAGNOSIS — R000 Tachycardia, unspecified: Secondary | ICD-10-CM | POA: Insufficient documentation

## 2015-08-08 DIAGNOSIS — E782 Mixed hyperlipidemia: Secondary | ICD-10-CM | POA: Diagnosis not present

## 2015-08-08 DIAGNOSIS — F339 Major depressive disorder, recurrent, unspecified: Secondary | ICD-10-CM | POA: Insufficient documentation

## 2015-08-08 DIAGNOSIS — R0602 Shortness of breath: Secondary | ICD-10-CM | POA: Diagnosis present

## 2015-08-08 DIAGNOSIS — I10 Essential (primary) hypertension: Secondary | ICD-10-CM | POA: Insufficient documentation

## 2015-08-08 DIAGNOSIS — R0789 Other chest pain: Secondary | ICD-10-CM | POA: Insufficient documentation

## 2015-08-08 DIAGNOSIS — Z79899 Other long term (current) drug therapy: Secondary | ICD-10-CM | POA: Insufficient documentation

## 2015-08-08 DIAGNOSIS — E559 Vitamin D deficiency, unspecified: Secondary | ICD-10-CM | POA: Insufficient documentation

## 2015-08-08 HISTORY — DX: Essential (primary) hypertension: I10

## 2015-08-08 HISTORY — PX: CARDIAC CATHETERIZATION: SHX172

## 2015-08-08 HISTORY — DX: Cardiac arrhythmia, unspecified: I49.9

## 2015-08-08 SURGERY — LEFT HEART CATH AND CORONARY ANGIOGRAPHY
Anesthesia: Moderate Sedation

## 2015-08-08 MED ORDER — FENTANYL CITRATE (PF) 100 MCG/2ML IJ SOLN
INTRAMUSCULAR | Status: AC
  Filled 2015-08-08: qty 2

## 2015-08-08 MED ORDER — MIDAZOLAM HCL 2 MG/2ML IJ SOLN
INTRAMUSCULAR | Status: DC | PRN
  Administered 2015-08-08 (×2): 1 mg via INTRAVENOUS

## 2015-08-08 MED ORDER — MIDAZOLAM HCL 2 MG/2ML IJ SOLN
INTRAMUSCULAR | Status: AC
  Filled 2015-08-08: qty 2

## 2015-08-08 MED ORDER — SODIUM CHLORIDE 0.9% FLUSH: 3.0000 mL | INTRAVENOUS | Status: DC | PRN

## 2015-08-08 MED ORDER — SODIUM CHLORIDE 0.9 % WEIGHT BASED INFUSION: 1.0000 mL/kg/h | INTRAVENOUS | Status: DC

## 2015-08-08 MED ORDER — HEPARIN (PORCINE) IN NACL 2-0.9 UNIT/ML-% IJ SOLN
INTRAMUSCULAR | Status: AC
  Filled 2015-08-08: qty 500

## 2015-08-08 MED ORDER — FENTANYL CITRATE (PF) 100 MCG/2ML IJ SOLN
INTRAMUSCULAR | Status: DC | PRN
  Administered 2015-08-08: 50 ug via INTRAVENOUS

## 2015-08-08 MED ORDER — SODIUM CHLORIDE 0.9 % IV SOLN
INTRAVENOUS | Status: DC
  Administered 2015-08-08: 12:00:00 via INTRAVENOUS

## 2015-08-08 MED ORDER — IOHEXOL 300 MG/ML  SOLN
INTRAMUSCULAR | Status: DC | PRN
  Administered 2015-08-08: 85 mL via INTRA_ARTERIAL

## 2015-08-08 MED ORDER — SODIUM CHLORIDE 0.9 % WEIGHT BASED INFUSION: 3.0000 mL/kg/h | INTRAVENOUS | Status: DC

## 2015-08-08 SURGICAL SUPPLY — 9 items
CATH INFINITI 5FR ANG PIGTAIL (CATHETERS) ×2 IMPLANT
CATH INFINITI 5FR JL4 (CATHETERS) ×2 IMPLANT
CATH INFINITI JR4 5F (CATHETERS) ×2 IMPLANT
DEVICE CLOSURE MYNXGRIP 5F (Vascular Products) ×2 IMPLANT
KIT MANI 3VAL PERCEP (MISCELLANEOUS) ×2 IMPLANT
NEEDLE PERC 18GX7CM (NEEDLE) ×2 IMPLANT
PACK CARDIAC CATH (CUSTOM PROCEDURE TRAY) ×2 IMPLANT
SHEATH PINNACLE 5F 10CM (SHEATH) ×2 IMPLANT
WIRE EMERALD 3MM-J .035X150CM (WIRE) ×4 IMPLANT

## 2015-08-08 NOTE — H&P (Signed)
Chief Complaint: Chief Complaint  Patient presents with  . abnormal stress  Date of Service: 08/06/2015 Date of Birth: Apr 16, 1966 PCP: Wonda Cerise, MD  History of Present Illness: Brittany Brooks is a 50 y.o.female patient who presents for follow-up visit. He has had intermittent episodes of tachycardia chest pain with radiation down the left side of her arm and chest and into her neck on occasion. These episodes can awake her from sleep and can occur with exertion. She underwent an ETT which was unremarkable for significant ischemia or arrhythmia. Echocardiogram was unremarkable for significant structural valvular abnormalities. She had persistent symptoms and underwent an ETT sestamibi which showed evidence of possible mild anterior ischemia versus breast attenuation. Given her progressive symptoms and abnormal functional study consider high risk given her symptoms risk factors will need to proceed with left cardiac catheterization to evaluate coronary anatomy. Risk and benefits a left cardiac catheterization were explained to the patient and she agrees to proceed. Past Medical and Surgical History  Past Medical History History reviewed. No pertinent past medical history.  Past Surgical History She has no past surgical history on file.   Medications and Allergies  Current Medications  Current Outpatient Prescriptions  Medication Sig Dispense Refill  . ALPRAZolam (XANAX) 0.25 MG tablet Take 1 tablet by mouth nightly as needed.  . ergocalciferol, vitamin D2, 50,000 unit capsule Take 1 capsule by mouth once a week.  . levothyroxine (SYNTHROID, LEVOTHROID) 125 MCG tablet Take 1 tablet by mouth once daily.  Marland Kitchen losartan (COZAAR) 50 MG tablet Take 1 tablet (50 mg total) by mouth once daily. 30 tablet 11  . pravastatin (PRAVACHOL) 20 MG tablet Take 1 tablet (20 mg total) by mouth nightly. 30 tablet 11   No current facility-administered medications for this visit.   Allergies: Review of  patient's allergies indicates no known allergies.  Social and Family History  Social History reports that she has never smoked. She does not have any smokeless tobacco history on file. She reports that she does not drink alcohol.  Family History History reviewed. No pertinent family history.  Review of Systems  Review of Systems  Constitutional: Negative for chills, diaphoresis, fever, malaise/fatigue and weight loss.  HENT: Negative for congestion, ear discharge, hearing loss and tinnitus.  Eyes: Negative for blurred vision.  Respiratory: Negative for cough, hemoptysis, sputum production, shortness of breath and wheezing.  Cardiovascular: Positive for chest pain and palpitations. Negative for orthopnea, claudication, leg swelling and PND.  Gastrointestinal: Negative for abdominal pain, blood in stool, constipation, diarrhea, heartburn, melena, nausea and vomiting.  Genitourinary: Negative for dysuria, frequency, hematuria and urgency.  Musculoskeletal: Negative for back pain, falls, joint pain and myalgias.  Skin: Negative for itching and rash.  Neurological: Negative for dizziness, tingling, focal weakness, loss of consciousness, weakness and headaches.  Endo/Heme/Allergies: Negative for polydipsia. Does not bruise/bleed easily.  Psychiatric/Behavioral: Negative for depression, memory loss and substance abuse. The patient is not nervous/anxious.   Physical Examination   Vitals: Visit Vitals  . BP 140/82  . Pulse 104  . Resp 12  . Ht 167.6 cm ( )  . Wt (!) 106.1 kg (234 lb)  . BMI 37.77 kg/m2   Ht:167.6 cm ( ) Wt:(!) 106.1 kg (234 lb) ZOX:WRUE surface area is 2.22 meters squared. Body mass index is 37.77 kg/(m^2).  Wt Readings from Last 3 Encounters:  08/06/15 (!) 106.1 kg (234 lb)  07/31/15 (!) 107.3 kg (236 lb 9.6 oz)  07/17/15 (!) 108 kg (238 lb 3.2  oz)   BP Readings from Last 3 Encounters:  08/06/15 140/82  07/31/15 (!) 140/100  07/17/15 (!) 140/98    General appearance appears in no acute distress  Head Mouth and Eye exam Normocephalic, without obvious abnormality, atraumatic Dentition is good Eyes appear anicteric   Neck exam Thyroid: normal  Nodes: no obvious adenopathy  LUNGS Breath Sounds: Normal Percussion: Normal  CARDIOVASCULAR JVP CV wave: no HJR: no Elevation at 90 degrees: None Carotid Pulse: normal pulsation bilaterally Bruit: None Apex: apical impulse normal  Auscultation Rhythm: normal sinus rhythm S1: normal S2: normal Clicks: no Rub: no Murmurs: no murmurs  Gallop: None ABDOMEN Liver enlargement: no Pulsatile aorta: no Ascites: no Bruits: no  EXTREMITIES Clubbing: no Edema: trace to 1+ bilateral pedal edema Pulses: peripheral pulses symmetrical Femoral Bruits: no Amputation: no SKIN Rash: no Cyanosis: no Embolic phemonenon: no Bruising: no NEURO Alert and Oriented to person, place and time: yes Non focal: yes  PSYCH: Pt appears to have normal affect  LABS REVIEWED  Lab Results  Component Value Date  HDL 31.0 (L) 07/04/2015   Lab Results  Component Value Date  LDLCALC 163 (H) 07/04/2015   Lab Results  Component Value Date  TRIG 244 (H) 07/04/2015   Assessment and Plan   50 y.o. female with  ICD-10-CM ICD-9-CM  1. Tachycardia-etiology of tachycardia is unclear. May be PVCs or PACs. No significant sustained arrhythmia noted on Holter. Will continue with current medications with consideration for starting atenolol based on the results of the heart cath. R00.0 785.0     2. SOB (shortness of breath)-echo did not show any significant structural valvular abnormalities R06.02 786.05   3. Atypical chest pain-etiology of chest pain is unclear. Continued chest pain both at rest and exertion. Had an abnormal functional study showing high risk study with possible anterior ischemia. Risk benefits left cardiac catheterization were explained to the patient and she agrees to  proceed. R07.89 786.59  4. Cardiac arrhythmia, unspecified cardiac arrhythmia type I49.9 427.9  5. Essential hypertension with goal blood pressure less than 130/80-we will continue with current regimen including low-sodium diet. I10 401.9  6. Mixed hyperlipidemia-we will proceed with a lipid profile to determine if there is evidence of poorly controlled hyperlipidemia. LDL goal of less than 130 at present. Will place on a statin E78.2 272.2  7. Vitamin D deficiency, unspecified E55.9 268.9  8. Episode of recurrent major depressive disorder, unspecified depression episode severity F33.9 296.30    Return in about 1 week (around 08/13/2015).  These notes generated with voice recognition software. I apologize for typographical errors.  Denton Ar, MD

## 2015-08-08 NOTE — Discharge Instructions (Signed)
Angiogram, Care After °Refer to this sheet in the next few weeks. These instructions provide you with information about caring for yourself after your procedure. Your health care provider may also give you more specific instructions. Your treatment has been planned according to current medical practices, but problems sometimes occur. Call your health care provider if you have any problems or questions after your procedure. °WHAT TO EXPECT AFTER THE PROCEDURE °After your procedure, it is typical to have the following: °· Bruising at the catheter insertion site that usually fades within 1-2 weeks. °· Blood collecting in the tissue (hematoma) that may be painful to the touch. It should usually decrease in size and tenderness within 1-2 weeks. °HOME CARE INSTRUCTIONS °· Take medicines only as directed by your health care provider. °· You may shower 24-48 hours after the procedure or as directed by your health care provider. Remove the bandage (dressing) and gently wash the site with plain soap and water. Pat the area dry with a clean towel. Do not rub the site, because this may cause bleeding. °· Do not take baths, swim, or use a hot tub until your health care provider approves. °· Check your insertion site every day for redness, swelling, or drainage. °· Do not apply powder or lotion to the site. °· Do not lift over 10 lb (4.5 kg) for 5 days after your procedure or as directed by your health care provider. °· Ask your health care provider when it is okay to: °¨ Return to work or school. °¨ Resume usual physical activities or sports. °¨ Resume sexual activity. °· Do not drive home if you are discharged the same day as the procedure. Have someone else drive you. °· You may drive 24 hours after the procedure unless otherwise instructed by your health care provider. °· Do not operate machinery or power tools for 24 hours after the procedure or as directed by your health care provider. °· If your procedure was done as an  outpatient procedure, which means that you went home the same day as your procedure, a responsible adult should be with you for the first 24 hours after you arrive home. °· Keep all follow-up visits as directed by your health care provider. This is important. °SEEK MEDICAL CARE IF: °· You have a fever. °· You have chills. °· You have increased bleeding from the catheter insertion site. Hold pressure on the site. °SEEK IMMEDIATE MEDICAL CARE IF: °· You have unusual pain at the catheter insertion site. °· You have redness, warmth, or swelling at the catheter insertion site. °· You have drainage (other than a small amount of blood on the dressing) from the catheter insertion site. °· The catheter insertion site is bleeding, and the bleeding does not stop after 30 minutes of holding steady pressure on the site. °· The area near or just beyond the catheter insertion site becomes pale, cool, tingly, or numb. °  °This information is not intended to replace advice given to you by your health care provider. Make sure you discuss any questions you have with your health care provider. °  °Document Released: 12/12/2004 Document Revised: 06/16/2014 Document Reviewed: 10/27/2012 °Elsevier Interactive Patient Education ©2016 [employer] °Groin Insertion Instructions-If you lose feeling or develop tingling or pain in your leg or foot after the procedure, please walk around first.  If the discomfort does not improve , contact your physician and proceed to the nearest emergency room.  Loss of feeling in your leg might mean that a   blockage has formed in the artery and this can be appropriately treated.  Limit your activity for the next two days after your procedure.  Avoid stooping, bending, heavy lifting or exertion as this may put pressure on the insertion site.  Resume normal activities in 48 hours.  You may shower after 24 hours but avoid excessive warm water and do not scrub the site.  Remove clear dressing in 48 hours.  If you  have had a closure device inserted, do not soak in a tub bath or a hot tub for at least one week. ° °No driving for 48 hours after discharge.  After the procedure, check the insertion site occasionally.  If any oozing occurs or there is apparent swelling, firm pressure over the site will prevent a bruise from forming.  You can not hurt anything by pressing directly on the site.  The pressure stops the bleeding by allowing a small clot to form.  If the bleeding continues after the pressure has been applied for more than 15 minutes, call 911 or go to the nearest emergency room.   ° °The x-ray dye causes you to pass a considerate amount of urine.  For this reason, you will be asked to drink plenty of liquids after the procedure to prevent dehydration.  You may resume you regular diet.  Avoid caffeine products.   ° °For pain at the site of your procedure, take non-aspirin medicines such as Tylenol. ° °Medications: A. Hold Metformin for 48 hours if applicable.  B. Continue taking all your present medications at home unless your doctor prescribes any changes. °

## 2015-08-10 ENCOUNTER — Ambulatory Visit: Payer: BLUE CROSS/BLUE SHIELD | Admitting: Cardiovascular Disease

## 2015-08-24 ENCOUNTER — Ambulatory Visit (INDEPENDENT_AMBULATORY_CARE_PROVIDER_SITE_OTHER): Payer: BLUE CROSS/BLUE SHIELD | Admitting: Internal Medicine

## 2015-08-24 ENCOUNTER — Encounter: Payer: Self-pay | Admitting: Internal Medicine

## 2015-08-24 VITALS — BP 130/90 | HR 73 | Ht 66.0 in | Wt 235.2 lb

## 2015-08-24 DIAGNOSIS — R000 Tachycardia, unspecified: Secondary | ICD-10-CM | POA: Diagnosis not present

## 2015-08-24 NOTE — Progress Notes (Signed)
ELECTROPHYSIOLOGY CONSULT NOTE  Patient ID: Brittany Brooks, MRN: [id number], DOB/AGE: 1965-12-08 50 y.o. Admit date: (Not on file) Date of Consult: 08/24/2015  Primary Physician: Sherlene ShamsULLO, TERESA L, MD Primary Cardiologist:[facility] Consulting Physician KF  Chief Complaint: PALPITATIONS   HPI Brittany ReidKaren E Brooks is a 50 y.o. female  Referred for evaluation of nocturnal tachycardia palpitations. These began as a couple months ago. She awakened one night with rapid heartbeats. They typically would last minutes and then abate. Heart rates may be into the 150s  They are assoc with some dyspnea and anxiexty  Records were reviewed from [facility]. Myoview 2017 demonstrated normal LV function with no ischemia. Echocardiogram normal LV function trivial regurgitation mild left ventricular hypertrophy  and a Holter monitor was obtained demonstrating 21 PVCs 100 PACs one 6 beat run of SVT. She also had nocturnal oximetry whichwas borderline  She also underwent cath which was normal  Labs were reviewed  No TSH is noted     Past Medical History  Diagnosis Date  . Hypothyroidism     since early 20's, secondary to Hashimoto's  . Multinodular goiter (nontoxic)     [facility]  . Hyperlipidemia   . Pancreatitis 2000    secondary to cholangiogram during lap chole, Sankar  . Hypertension   . Dysrhythmia       Surgical History:  Past Surgical History  Procedure Laterality Date  . Cholecystectomy  2000    recurrent cholecystitis during pregnancy  . Cesarean section  Sept 2000  . Cardiac catheterization N/A 08/08/2015    Procedure: Left Heart Cath and Coronary Angiography;  Surgeon: Dalia HeadingKenneth A Fath, MD;  Location: [facility] INVASIVE CV LAB;  Service: Cardiovascular;  Laterality: N/A;     Home Meds: Prior to Admission medications   Medication Sig Start Date End Date Taking? Authorizing Provider  ALPRAZolam Prudy Feeler(XANAX) 0.25 MG tablet TAKE 1 TABLET AT BEDTIME AS NEEDED 06/15/15   Sherlene Shamseresa L Tullo, MD    atenolol (TENORMIN) 25 MG tablet TAKE ONE TABLET BY MOUTH Brooks DAY Patient not taking: Reported on 08/08/2015 06/14/15   Melody N Shambley, CNM  benzonatate (TESSALON) 100 MG capsule Take 1 capsule (100 mg total) by mouth 3 (three) times daily. 09/21/14   Sherlene Shamseresa L Tullo, MD  Flaxseed, Linseed, (FLAX SEED OIL) 1000 MG CAPS Take 1,000 mg by mouth daily. Reported on 08/08/2015    Historical Provider, MD  hydrochlorothiazide (HYDRODIURIL) 25 MG tablet Take 1 tablet (25 mg total) by mouth daily. Patient not taking: Reported on 05/17/2015 09/21/14   Sherlene Shamseresa L Tullo, MD  levothyroxine (SYNTHROID, LEVOTHROID) 125 MCG tablet TAKE ONE TABLET BY MOUTH Brooks DAY 10/17/14   Sherlene Shamseresa L Tullo, MD  losartan (COZAAR) 50 MG tablet Take 50 mg by mouth daily.    Historical Provider, MD  Multiple Vitamin (MULTIVITAMIN) tablet Take 1 tablet by mouth daily.    Historical Provider, MD  Norethin Ace-Eth Estrad-FE (TAYTULLA) 1-20 MG-MCG(24) CAPS Take 1 capsule by mouth Brooks morning. Patient not taking: Reported on 08/08/2015 05/17/15   Melody N Shambley, CNM  phentermine (ADIPEX-P) 37.5 MG tablet 1/2 tablet in the am and early afternoon Patient not taking: Reported on 05/17/2015 09/21/14   Sherlene Shamseresa L Tullo, MD  Vitamin D, Ergocalciferol, (DRISDOL) 50000 UNITS CAPS capsule Take 1 capsule (50,000 Units total) by mouth Brooks 7 (seven) days. 05/18/15   Melody Suzan NailerN Shambley, CNM    Allergies:  Allergies  Allergen Reactions  . Lexapro [Escitalopram Oxalate] Other (See Comments)  Tachycardia     Social History   Social History  . Marital Status: Married    Spouse Name: N/A  . Number of Children: N/A  . Years of Education: N/A   Occupational History  . Not on file.   Social History Main Topics  . Smoking status: Never Smoker   . Smokeless tobacco: Never Used  . Alcohol Use: 2.4 oz/week    4 Glasses of wine per week  . Drug Use: No  . Sexual Activity: Yes   Other Topics Concern  . Not on file   Social History Narrative      Family History  Problem Relation Age of Onset  . Hyperlipidemia Mother   . Hypertension Mother   . COPD Mother   . Hyperlipidemia Father   . Heart disease Father 98    CABG x 4  . Diabetes Father   . Stroke Father     TIA  . COPD Father   . Cancer Neg Hx      ROS:  Please see the history of present illness.     All other systems reviewed and negative.    Physical Exam: Blood pressure 130/90, pulse 73, height  (1.676 m), weight 235 lb 3.2 oz (106.686 kg), last menstrual period 08/07/2015. General: Well developed, well nourished female in no acute distress. Head: Normocephalic, atraumatic, sclera non-icteric, no xanthomas, nares are without discharge. EENT: normal  Lymph Nodes:  none Neck: Negative for carotid bruits. JVD not elevated. Back:without scoliosis kyphosis Lungs: Clear bilaterally to auscultation without wheezes, rales, or rhonchi. Breathing is unlabored. Heart: RRR with S1 S2. No  murmur . No rubs, or gallops appreciated. Abdomen: Soft, non-tender, non-distended with normoactive bowel sounds. No hepatomegaly. No rebound/guarding. No obvious abdominal masses. Msk:  Strength and tone appear normal for age. Extremities: No clubbing or cyanosis. No edema.  Distal pedal pulses are 2+ and equal bilaterally. Skin: Warm and Dry Neuro: Alert and oriented X 3. CN III-XII intact Grossly normal sensory and motor function . Psych:  Responds to questions appropriately with a normal affect.      Labs: Cardiac Enzymes No results for input(s): CKTOTAL, CKMB, TROPONINI in the last 72 hours. CBC Lab Results  Component Value Date   WBC 10.4 05/17/2015   HGB 11.8* 08/16/2013   HCT 38.4 05/17/2015   MCV 80 05/17/2015   PLT 342 05/17/2015   PROTIME: No results for input(s): LABPROT, INR in the last 72 hours. Chemistry No results for input(s): NA, K, CL, CO2, BUN, CREATININE, CALCIUM, PROT, BILITOT, ALKPHOS, ALT, AST, GLUCOSE in the last 168 hours.  Invalid input(s):  LABALBU Lipids Lab Results  Component Value Date   CHOL 272* 09/15/2014   HDL 39.90 09/15/2014   LDLCALC 139* 08/16/2013   TRIG 283.0* 09/15/2014   BNP No results found for: PROBNP Thyroid Function Tests: No results for input(s): TSH, T4TOTAL, T3FREE, THYROIDAB in the last 72 hours.  Invalid input(s): FREET3 Miscellaneous No results found for: DDIMER  Radiology/Studies:  No results found.  EKG: sinus 73 05/17/38   Assessment and Plan:  pALPITations nocturnal  Reviewed the Holter monitor. It demonstrates a tachycardic episode at 6:00 in the morning with a P wave morphology most consistent with sinus. Looking at the heart rate trends it appears to have been not abrupt in onset or offset. This being the case, I suspect it is secondary and the most common nocturnal trigger that I can imagine is sleep apnea. We will undertake a sleep  study.  I don't think that this is a primary arrhythmia. We did discuss the possibility that was an atrial focus separate from the sinus node  Another concern given the fact that she had one daytime episode recently might be pheochromocytoma although the 1with of her blood pressure was not excessive.  We spent about 1 1/2 hrs      Sherryl Manges  [phone]

## 2015-08-24 NOTE — Patient Instructions (Addendum)
Medication Instructions:  Your physician recommends that you continue on your current medications as directed. Please refer to the Current Medication list given to you today.  Labwork: None ordered  Testing/Procedures: None ordered  Follow-Up: Your physician recommends that you schedule a follow-up appointment in: 6 weeks with Dr. [place].  If you need a refill on your cardiac medications before your next appointment, please call your pharmacy.  Thank you for choosing [facility]!!

## 2015-08-29 ENCOUNTER — Other Ambulatory Visit: Payer: Self-pay | Admitting: Internal Medicine

## 2015-08-29 NOTE — Telephone Encounter (Signed)
Mychart message sent needs Office visit.

## 2015-09-04 ENCOUNTER — Telehealth: Payer: Self-pay | Admitting: *Deleted

## 2015-09-04 DIAGNOSIS — I471 Supraventricular tachycardia: Secondary | ICD-10-CM

## 2015-09-04 DIAGNOSIS — I10 Essential (primary) hypertension: Secondary | ICD-10-CM

## 2015-09-04 NOTE — Telephone Encounter (Signed)
Please advise, thanks.

## 2015-09-04 NOTE — Telephone Encounter (Signed)
I do not have a referral or order for this pt. Please advise

## 2015-09-04 NOTE — Telephone Encounter (Signed)
Patient would like to check on her referral,she stated that she was seen by Dr. Loretta PlumeSteven Klien, and was to have a referral to heart care, please advise

## 2015-09-05 NOTE — Telephone Encounter (Signed)
LMOMTCB

## 2015-09-05 NOTE — Telephone Encounter (Signed)
Please clarify ,  Does she need a referral for the visit she had with Dr Graciela Husbandsklein? Or dose she need a referral for a sleep study which he apparently wanted me to order but did not ask me directly so I never saw the request

## 2015-09-06 NOTE — Telephone Encounter (Signed)
t i thught the request for sleep study [facility]with the staff message through [facility]  If you could arrange that would be great  Thanks steve

## 2015-09-06 NOTE — Telephone Encounter (Signed)
I got it! Thx!

## 2015-09-06 NOTE — Telephone Encounter (Signed)
Sleep study ordered

## 2015-09-25 ENCOUNTER — Other Ambulatory Visit: Payer: BLUE CROSS/BLUE SHIELD

## 2015-09-28 ENCOUNTER — Ambulatory Visit: Payer: BLUE CROSS/BLUE SHIELD | Admitting: Internal Medicine

## 2015-10-05 ENCOUNTER — Ambulatory Visit: Payer: BLUE CROSS/BLUE SHIELD | Attending: Otolaryngology

## 2015-10-05 DIAGNOSIS — R0683 Snoring: Secondary | ICD-10-CM | POA: Insufficient documentation

## 2015-10-05 DIAGNOSIS — G4733 Obstructive sleep apnea (adult) (pediatric): Secondary | ICD-10-CM | POA: Diagnosis present

## 2015-10-05 DIAGNOSIS — G2581 Restless legs syndrome: Secondary | ICD-10-CM | POA: Diagnosis not present

## 2015-10-05 DIAGNOSIS — I10 Essential (primary) hypertension: Secondary | ICD-10-CM | POA: Insufficient documentation

## 2015-10-14 ENCOUNTER — Telehealth: Payer: Self-pay | Admitting: Internal Medicine

## 2015-10-14 DIAGNOSIS — G4733 Obstructive sleep apnea (adult) (pediatric): Secondary | ICD-10-CM | POA: Insufficient documentation

## 2015-10-14 NOTE — Telephone Encounter (Signed)
Recent sleep study test was diagnostic of sleep apnea (mild, but worse with REM sleep).  A CPAP titration trial is recommended.  This will be set up for her in the next few weeks.

## 2015-10-14 NOTE — Assessment & Plan Note (Signed)
10/05/15  Study,  Mild ,  Severe with REM accompanied by desats .  CPAP titration study has been ordered.

## 2015-10-15 ENCOUNTER — Telehealth: Payer: Self-pay | Admitting: Internal Medicine

## 2015-10-15 NOTE — Telephone Encounter (Signed)
Left message for patient to return call to office please advise patient of message below and that referral has been faxed.

## 2015-10-15 NOTE — Telephone Encounter (Signed)
Were these results ever sent.

## 2015-10-15 NOTE — Telephone Encounter (Signed)
Pt had a sleep study done on April 28. Now they want to do another sleep study with a c-pap. Pt is requesting another referral from Dr. Darrick Huntsmanullo.   Thanks

## 2015-10-15 NOTE — Telephone Encounter (Signed)
Let patient know referral sent.

## 2015-10-15 NOTE — Telephone Encounter (Signed)
Look in chart results are in and referral for second sleep study in chart referral sent.

## 2015-10-18 ENCOUNTER — Ambulatory Visit (INDEPENDENT_AMBULATORY_CARE_PROVIDER_SITE_OTHER): Payer: BLUE CROSS/BLUE SHIELD | Admitting: Internal Medicine

## 2015-10-18 ENCOUNTER — Encounter: Payer: Self-pay | Admitting: Internal Medicine

## 2015-10-18 VITALS — BP 128/86 | HR 71 | Ht 66.0 in | Wt 238.5 lb

## 2015-10-18 DIAGNOSIS — I10 Essential (primary) hypertension: Secondary | ICD-10-CM | POA: Diagnosis not present

## 2015-10-18 DIAGNOSIS — I471 Supraventricular tachycardia, unspecified: Secondary | ICD-10-CM

## 2015-10-18 NOTE — Patient Instructions (Addendum)
Medication Instructions:  Your physician recommends that you continue on your current medications as directed. Please refer to the Current Medication list given to you today.  Labwork: None ordered  Testing/Procedures: None ordered  Follow-Up: Your physician recommends that you schedule a follow-up appointment in: 8 weeks with Dr. Graciela HusbandsKlein.   If you need a refill on your cardiac medications before your next appointment, please call your pharmacy.  Thank you for choosing [facility]!!

## 2015-10-18 NOTE — Progress Notes (Signed)
Patient Care Team: Sherlene Shamseresa L Tullo, MD as PCP - General (Internal Medicine)   HPI  Brittany ReidKaren E Brooks is a 50 y.o. female Seen in follow-up for nocturnal palpitations. She has had heart rates documented in the 130-50 range; P waves were similar to sinus but abrupt onset suggest the possibility of atrial tachycardia.   Myoview 2017 demonstrated normal LV function with no ischemia. Echocardiogram normal LV function trivial regurgitation mild left ventricular hypertrophy and a Holter monitor was obtained demonstrating 21 PVCs 100 PACs one 6 beat run of SVT. She also had nocturnal oximetry whichwas borderline  Catheterization [facility] was normal  Urine studies for adrenaline were normal  Records and Results Reviewed  intervals sleep study demonstrated mild sleep apnea with an AHI of 6.0  PLM was noted at 16.7 and CPAP was recommended   I discussed the case with Dr. Florina OuT   She's supportive of the recommendation from the sleep Dr. This is based primarily on the REM AHI and the level of desaturation Past Medical History  Diagnosis Date  . Hypothyroidism     since early 20's, secondary to Hashimoto's  . Multinodular goiter (nontoxic)     [facility]  . Hyperlipidemia   . Pancreatitis 2000    secondary to cholangiogram during lap chole, Sankar  . Hypertension   . Dysrhythmia   . Sleep apnea     Past Surgical History  Procedure Laterality Date  . Cholecystectomy  2000    recurrent cholecystitis during pregnancy  . Cesarean section  Sept 2000  . Cardiac catheterization N/A 08/08/2015    Procedure: Left Heart Cath and Coronary Angiography;  Surgeon: Dalia HeadingKenneth A Fath, MD;  Location: [facility] INVASIVE CV LAB;  Service: Cardiovascular;  Laterality: N/A;    Current Outpatient Prescriptions  Medication Sig Dispense Refill  . ALPRAZolam (XANAX) 0.25 MG tablet TAKE 1 TABLET AT BEDTIME AS NEEDED 30 tablet 5  . Flaxseed, Linseed, (FLAX SEED OIL) 1000 MG CAPS Take 1,000 mg by  mouth daily. Reported on 08/08/2015    . levothyroxine (SYNTHROID, LEVOTHROID) 125 MCG tablet TAKE ONE TABLET EVERY DAY 90 tablet 0  . losartan (COZAAR) 50 MG tablet Take 50 mg by mouth daily.    . Multiple Vitamin (MULTIVITAMIN) tablet Take 1 tablet by mouth daily.     No current facility-administered medications for this visit.    Allergies  Allergen Reactions  . Lexapro [Escitalopram Oxalate] Other (See Comments)    Tachycardia       Review of Systems negative except from HPI and PMH  Physical Exam BP 128/86 mmHg  Pulse 71  Ht 5\' 6"  (1.676 m)  Wt 238 lb 8 oz (108.183 kg)  BMI 38.51 kg/m2 Well developed and well nourished in no acute distress HENT normal E scleral and icterus clear Neck Supple JVP flat; carotids brisk and full Clear to ausculation  Regular rate and rhythm, no murmurs gallops or rub Soft with active bowel sounds No clubbing cyanosis  Edema Alert and oriented, grossly normal motor and sensory function Skin Warm and Dry  ECG demonstrates sinus rhythm at 71 Intervals 05/16/39 Otherwise normal  Assessment and  Plan  Nocturnal palpitations   Sleep apnea   Hypertension   She is to undergo the second part of her sleep study tomorrow. Prior to pursuing further issues related to dyspnea or palpitations we will have her undertake CPAP for a couple of months.   Reviewed sleep study results with patient  We  spent more than 50% of our >25 min visit in face to face counseling regarding the above

## 2015-10-19 ENCOUNTER — Ambulatory Visit: Payer: BLUE CROSS/BLUE SHIELD | Attending: Otolaryngology

## 2015-10-19 DIAGNOSIS — F419 Anxiety disorder, unspecified: Secondary | ICD-10-CM | POA: Insufficient documentation

## 2015-10-19 DIAGNOSIS — G4733 Obstructive sleep apnea (adult) (pediatric): Secondary | ICD-10-CM | POA: Diagnosis not present

## 2015-10-19 DIAGNOSIS — R0683 Snoring: Secondary | ICD-10-CM | POA: Diagnosis not present

## 2015-10-22 NOTE — Telephone Encounter (Signed)
Patient had titration study on 10/19/15 advised patient will call when received.

## 2015-10-25 ENCOUNTER — Telehealth: Payer: Self-pay | Admitting: Internal Medicine

## 2015-10-25 NOTE — Telephone Encounter (Signed)
No have not seen the titration study results yet.

## 2015-10-25 NOTE — Telephone Encounter (Signed)
Brittany MessierKathy do we have these results? thanks

## 2015-10-25 NOTE — Telephone Encounter (Signed)
Pt is calling for the 2nd part of the cpap results. Please call her at [phone].

## 2015-10-26 ENCOUNTER — Encounter: Payer: Self-pay | Admitting: Internal Medicine

## 2015-10-31 NOTE — Telephone Encounter (Signed)
The CPAP titration study showed resolution of all apneic events at a pressure of 10 cm H20.  She can continue to use the xanax to help her adjust to the mask.

## 2015-10-31 NOTE — Telephone Encounter (Signed)
Yes we have the titration study  Dr. Darrick Huntsmanullo   [facility] . FYI

## 2015-10-31 NOTE — Telephone Encounter (Signed)
Mychart message sent.

## 2015-10-31 NOTE — Telephone Encounter (Signed)
Any update?

## 2015-11-16 ENCOUNTER — Ambulatory Visit: Payer: BLUE CROSS/BLUE SHIELD | Admitting: Family Medicine

## 2015-11-16 ENCOUNTER — Encounter: Payer: Self-pay | Admitting: Emergency Medicine

## 2015-11-16 ENCOUNTER — Emergency Department: Payer: Worker's Compensation

## 2015-11-16 ENCOUNTER — Emergency Department
Admission: EM | Admit: 2015-11-16 | Discharge: 2015-11-16 | Disposition: A | Discharge status: Home / Self Care | Payer: Worker's Compensation | Attending: Emergency Medicine | Admitting: Emergency Medicine

## 2015-11-16 DIAGNOSIS — Y990 Civilian activity done for income or pay: Secondary | ICD-10-CM | POA: Diagnosis not present

## 2015-11-16 DIAGNOSIS — E785 Hyperlipidemia, unspecified: Secondary | ICD-10-CM | POA: Diagnosis not present

## 2015-11-16 DIAGNOSIS — Y939 Activity, unspecified: Secondary | ICD-10-CM | POA: Diagnosis not present

## 2015-11-16 DIAGNOSIS — W109XXA Fall (on) (from) unspecified stairs and steps, initial encounter: Secondary | ICD-10-CM | POA: Insufficient documentation

## 2015-11-16 DIAGNOSIS — S300XXA Contusion of lower back and pelvis, initial encounter: Secondary | ICD-10-CM

## 2015-11-16 DIAGNOSIS — Z8679 Personal history of other diseases of the circulatory system: Secondary | ICD-10-CM | POA: Insufficient documentation

## 2015-11-16 DIAGNOSIS — Y929 Unspecified place or not applicable: Secondary | ICD-10-CM | POA: Insufficient documentation

## 2015-11-16 DIAGNOSIS — E039 Hypothyroidism, unspecified: Secondary | ICD-10-CM | POA: Diagnosis not present

## 2015-11-16 DIAGNOSIS — F339 Major depressive disorder, recurrent, unspecified: Secondary | ICD-10-CM | POA: Insufficient documentation

## 2015-11-16 DIAGNOSIS — I10 Essential (primary) hypertension: Secondary | ICD-10-CM | POA: Insufficient documentation

## 2015-11-16 DIAGNOSIS — Z79899 Other long term (current) drug therapy: Secondary | ICD-10-CM | POA: Insufficient documentation

## 2015-11-16 LAB — POCT PREGNANCY, URINE: Preg Test, Ur: NEGATIVE

## 2015-11-16 MED ORDER — HYDROCODONE-ACETAMINOPHEN 5-325 MG PO TABS: 1.0000 | ORAL_TABLET | ORAL | Status: DC | PRN

## 2015-11-16 MED ORDER — KETOROLAC TROMETHAMINE 60 MG/2ML IM SOLN
60.0000 mg | Freq: Once | INTRAMUSCULAR | Status: AC
  Administered 2015-11-16: 60 mg via INTRAMUSCULAR
  Filled 2015-11-16: qty 2

## 2015-11-16 MED ORDER — METHOCARBAMOL 500 MG PO TABS: 500.0000 mg | ORAL_TABLET | Freq: Four times a day (QID) | ORAL | Status: DC | PRN

## 2015-11-16 MED ORDER — NAPROXEN 500 MG PO TABS: 500.0000 mg | ORAL_TABLET | Freq: Two times a day (BID) | ORAL | Status: DC

## 2015-11-16 NOTE — ED Provider Notes (Signed)
[facility] Emergency Department Provider Note  ____________________________________________  Time seen: Approximately 10:08 AM  I have reviewed the triage vital signs and the nursing notes.   HISTORY  Chief Complaint Fall    HPI Brittany Brooks is a 50 y.o. female who presents for evaluation of falling down 5 steps today at work. Complaining of low back and buttocks pain. Patient notices that she started some bruising to the lower back area since the fall. Describes her pain as 10 over 10 nonradiating and no paresthesia or numbness in the groin. Unable to sit or lay flat on her back. Has not taken any medication over-the-counter at this time.   Past Medical History  Diagnosis Date  . Hypothyroidism     since early 20's, secondary to Hashimoto's  . Multinodular goiter (nontoxic)     biopsy negativre by [facility]  . Hyperlipidemia   . Pancreatitis 2000    secondary to cholangiogram during lap chole, [facility]  . Hypertension   . Dysrhythmia   . Sleep apnea     Patient Active Problem List   Diagnosis Date Noted  . OSA (obstructive sleep apnea) 10/14/2015  . Pre-diabetes 05/18/2015  . Vitamin D deficiency 05/18/2015  . Cough 09/24/2014  . Essential hypertension 09/24/2014  . Arrhythmia 07/03/2013  . Cervical spine ankylosis (HCC) 07/22/2012  . Polycystic ovarian syndrome 04/29/2012  . Obesity (BMI 30-39.9) 09/28/2011  . Hypothyroidism 09/28/2011  . Major depressive disorder, recurrent episode, unspecified 09/28/2011  . Multinodular goiter (nontoxic)   . Mixed dyslipidemia   . History of pancreatitis     Past Surgical History  Procedure Laterality Date  . Cholecystectomy  2000    recurrent cholecystitis during pregnancy  . Cesarean section  Sept 2000  . Cardiac catheterization N/A 08/08/2015    Procedure: Left Heart Cath and Coronary Angiography;  Surgeon: Dalia Heading, MD;  Location: [facility] INVASIVE CV LAB;  Service: Cardiovascular;  Laterality:  N/A;    Current Outpatient Rx  Name  Route  Sig  Dispense  Refill  . ALPRAZolam (XANAX) 0.25 MG tablet      TAKE 1 TABLET AT BEDTIME AS NEEDED   30 tablet   5     PT OUT OF REFILLS. THANK YOU   . Flaxseed, Linseed, (FLAX SEED OIL) 1000 MG CAPS   Oral   Take 1,000 mg by mouth daily. Reported on 08/08/2015         . HYDROcodone-acetaminophen (NORCO) 5-325 MG tablet   Oral   Take 1-2 tablets by mouth every 4 (four) hours as needed for moderate pain.   15 tablet   0   . levothyroxine (SYNTHROID, LEVOTHROID) 125 MCG tablet      TAKE ONE TABLET EVERY DAY   90 tablet   0     Office visit needed for further refills.   Marland Kitchen losartan (COZAAR) 50 MG tablet   Oral   Take 50 mg by mouth daily.         . methocarbamol (ROBAXIN) 500 MG tablet   Oral   Take 1 tablet (500 mg total) by mouth every 6 (six) hours as needed for muscle spasms.   30 tablet   0   . Multiple Vitamin (MULTIVITAMIN) tablet   Oral   Take 1 tablet by mouth daily.         . naproxen (NAPROSYN) 500 MG tablet   Oral   Take 1 tablet (500 mg total) by mouth 2 (two) times daily with  a meal.   60 tablet   0     Allergies Lexapro  Family History  Problem Relation Age of Onset  . Hyperlipidemia Mother   . Hypertension Mother   . COPD Mother   . Hyperlipidemia Father   . Heart disease Father 74    CABG x 4  . Diabetes Father   . Stroke Father     TIA  . COPD Father   . Cancer Neg Hx     Social History Social History  Substance Use Topics  . Smoking status: Never Smoker   . Smokeless tobacco: Never Used  . Alcohol Use: 2.4 oz/week    4 Glasses of wine per week    Review of Systems Constitutional: No fever/chills Cardiovascular: Denies chest pain. Respiratory: Denies shortness of breath. Gastrointestinal: No abdominal pain.  No nausea, no vomiting.  No diarrhea.  No constipation. Genitourinary: Negative for dysuria. Musculoskeletal: Positive for low back and buttocks pain. Skin:  Negative for rash. Neurological: Negative for headaches, focal weakness or numbness.  10-point ROS otherwise negative.  ____________________________________________   PHYSICAL EXAM:  VITAL SIGNS: ED Triage Vitals  Enc Vitals Group     BP 11/16/15 0958 148/102 mmHg     Pulse Rate 11/16/15 0958 95     Resp 11/16/15 0958 18     Temp 11/16/15 0958 98.4 F (36.9 C)     Temp Source 11/16/15 0958 Oral     SpO2 11/16/15 0958 99 %     Weight 11/16/15 0958 230 lb (104.327 kg)     Height 11/16/15 0958 5\' 6"  (1.676 m)     Head Cir --      Peak Flow --      Pain Score 11/16/15 0945 10     Pain Loc --      Pain Edu? --      Excl. in GC? --     Constitutional: Alert and oriented. Well appearing and in no acute distress. Eyes: Conjunctivae are normal. PERRL. EOMI. Head: Atraumatic. Neck: No stridor.   Cardiovascular: Normal rate, regular rhythm. Grossly normal heart sounds.  Good peripheral circulation. Respiratory: Normal respiratory effort.  No retractions. Lungs CTAB. Gastrointestinal: Soft and nontender. No distention.  No CVA tenderness. Musculoskeletal: Positive point tenderness from the lower lumbar spine down through the coccyx some ecchymosis noted around L4-L5. Neurologic:  Normal speech and language. No gross focal neurologic deficits are appreciated. No gait instability. Skin:  Skin is warm, dry and intact. No rash noted. Psychiatric: Mood and affect are normal. Speech and behavior are normal.  ____________________________________________   LABS (all labs ordered are listed, but only abnormal results are displayed)  Labs Reviewed  POC URINE PREG, ED  POCT PREGNANCY, URINE    RADIOLOGY   ____________________________________________   PROCEDURES  Procedure(s) performed: None  Critical Care performed: No  ____________________________________________   INITIAL IMPRESSION / ASSESSMENT AND PLAN / ED COURSE  Pertinent labs & imaging results that were  available during my care of the patient were reviewed by me and considered in my medical decision making (see chart for details).  Status post fall with acute lumbar sacral Minkoff 6 contusion. Rx given for methocarbamol, Naprosyn 500 and Vicodin. Patient follow-up with PCP or return to ER with any worsening symptomology. Encourage use of heat moist and ice as needed for pain. ____________________________________________   FINAL CLINICAL IMPRESSION(S) / ED DIAGNOSES  Final diagnoses:  Lumbar contusion, initial encounter  Contusion of sacrum, initial encounter  This chart was dictated using voice recognition software/Dragon. Despite best efforts to proofread, errors can occur which can change the meaning. Any change was purely unintentional.   Evangeline Dakinharles M Tamrah Victorino, PA-C 11/16/15 1210  Minna AntisKevin Paduchowski, MD 11/17/15 1452

## 2015-11-16 NOTE — Discharge Instructions (Signed)
Contusion °A contusion is a deep bruise. Contusions are the result of a blunt injury to tissues and muscle fibers under the skin. The injury causes bleeding under the skin. The skin overlying the contusion may turn blue, purple, or yellow. Minor injuries will give you a painless contusion, but more severe contusions may stay painful and swollen for a few weeks.  °CAUSES  °This condition is usually caused by a blow, trauma, or direct force to an area of the body. °SYMPTOMS  °Symptoms of this condition include: °· Swelling of the injured area. °· Pain and tenderness in the injured area. °· Discoloration. The area may have redness and then turn blue, purple, or yellow. °DIAGNOSIS  °This condition is diagnosed based on a physical exam and medical history. An X-ray, CT scan, or MRI may be needed to determine if there are any associated injuries, such as broken bones (fractures). °TREATMENT  °Specific treatment for this condition depends on what area of the body was injured. In general, the best treatment for a contusion is resting, icing, applying pressure to (compression), and elevating the injured area. This is often called the RICE strategy. Over-the-counter anti-inflammatory medicines may also be recommended for pain control.  °HOME CARE INSTRUCTIONS  °· Rest the injured area. °· If directed, apply ice to the injured area: °· Put ice in a plastic bag. °· Place a towel between your skin and the bag. °· Leave the ice on for 20 minutes, 2-3 times per day. °· If directed, apply light compression to the injured area using an elastic bandage. Make sure the bandage is not wrapped too tightly. Remove and reapply the bandage as directed by your health care provider. °· If possible, raise (elevate) the injured area above the level of your heart while you are sitting or lying down. °· Take over-the-counter and prescription medicines only as told by your health care provider. °SEEK MEDICAL CARE IF: °· Your symptoms do not  improve after several days of treatment. °· Your symptoms get worse. °· You have difficulty moving the injured area. °SEEK IMMEDIATE MEDICAL CARE IF:  °· You have severe pain. °· You have numbness in a hand or foot. °· Your hand or foot turns pale or cold. °  °This information is not intended to replace advice given to you by your health care provider. Make sure you discuss any questions you have with your health care provider. °  °Document Released: 03/05/2005 Document Revised: 02/14/2015 Document Reviewed: 10/11/2014 °Elsevier Interactive Patient Education ©2016 Else[employer] Inc[employer] ° °Tailbone Injury °The tailbone (coccyx) is the small bone at the lower end of the spine. A tailbone injury may involve stretched ligaments, bruising, or a broken bone (fracture). Tailbone injuries can be painful, and some may take a long time to heal. °CAUSES °This condition is often caused by falling and landing on the tailbone. Other causes include: °· Repeated strain or friction from actions such as rowing and bicycling. °· Childbirth. °In some cases, the cause may not be known. °RISK FACTORS °This condition is more common in women than in men. °SYMPTOMS °Symptoms of this condition include: °· Pain in the lower back, especially when sitting. °· Pain or difficulty when standing up from a sitting position. °· Bruising in the tailbone area. °· Painful bowel movements. °· In women, pain during intercourse. °DIAGNOSIS °This condition may be diagnosed based on your symptoms and a physical exam. X-rays may be taken if a fracture is suspected. You may also have other tests, such as   a CT scan or MRI. °TREATMENT °This condition may be treated with medicines to help relieve your pain. Most tailbone injuries heal on their own in 4-6 weeks. However, recovery time may be longer if the injury involves a fracture. °HOME CARE INSTRUCTIONS °· Take medicines only as directed by your health care provider. °· If directed, apply ice to the injured  area: °¨ Put ice in a plastic bag. °¨ Place a towel between your skin and the bag. °¨ Leave the ice on for 20 minutes, 2-3 times per day for the first 1-2 days. °· Sit on a large, rubber or inflated ring or cushion to ease your pain. Lean forward when you are sitting to help decrease discomfort. °· Avoid sitting for long periods of time. °· Increase your activity as the pain allows. Perform any exercises that are recommended by your health care provider or physical therapist. °· If you have pain during bowel movements, use stool softeners as directed by your health care provider. °· Eat a diet that includes plenty of fiber to help prevent constipation. °· Keep all follow-up visits as directed by your health care provider. This is important. °PREVENTION °Wear appropriate padding and sports gear when bicycling and rowing. This can help to prevent developing an injury that is caused by repeated strain or friction. °SEEK MEDICAL CARE IF: °· Your pain becomes worse. °· Your bowel movements cause a great deal of discomfort. °· You are unable to have a bowel movement. °· You have uncontrolled urine loss (urinary incontinence). °· You have a fever. °  °This information is not intended to replace advice given to you by your health care provider. Make sure you discuss any questions you have with your health care provider. °  °Document Released: 05/23/2000 Document Revised: 10/10/2014 Document Reviewed: 05/22/2014 °Elsevier Interactive Patient Education ©2016 Elsevier Inc. ° °

## 2015-11-16 NOTE — ED Notes (Signed)
Negative urine pregnancy

## 2015-11-16 NOTE — ED Notes (Signed)
PT states she fell down about 5 stairs at work this morning around 840am.  Pt states she fell on to her sacrum on every step and states area is swollen on the right side.  Pt states she took 600 Ibuprofen after incident as well as one-half of 0.25mg  Xanax.  Pt states it is difficult to sit down. Pt given cup for urine sample in case of XR.

## 2015-11-16 NOTE — ED Notes (Signed)
Pt to ed with c/o falling down 5 steps at work today.  Pt now c/o buttock pain

## 2015-11-19 ENCOUNTER — Telehealth: Payer: Self-pay | Admitting: Internal Medicine

## 2015-11-19 NOTE — Telephone Encounter (Signed)
Can this ED follow up be with another provider since it is workmans comp?

## 2015-11-19 NOTE — Telephone Encounter (Signed)
Yes it can.

## 2015-11-19 NOTE — Telephone Encounter (Signed)
Pt called about needing a  ED Follow up due to fall at work on 06/09. Pt needs a appt this week for workers comp. No appt avail to sch. Let me know where to sch. Please advise? Thank you!  Call pt @ [phone]. Thank you!

## 2015-11-20 NOTE — Telephone Encounter (Signed)
Pt is scheduled with Dr Adriana Simasook on 06/20.

## 2015-11-20 NOTE — Telephone Encounter (Signed)
See if she will except appointment wit another provider? NO appointment available with PCP.

## 2015-11-21 ENCOUNTER — Telehealth: Payer: Self-pay | Admitting: Internal Medicine

## 2015-11-21 NOTE — Telephone Encounter (Signed)
Pt states she had a bad fall on Friday 6/9. States she was given Naproxyn, hydrocodone, and a muscle relaxer. States when she takes the Naproxyn her HR elevates to 150s. States she "felt like" her BP was elevates. "pulsing" in her left ear.  She is not sure if it is related to this medicine, and if so, can we recommend an alternative. Please call and advise.

## 2015-11-21 NOTE — Telephone Encounter (Signed)
Fwd to sherri Price .

## 2015-11-22 NOTE — Telephone Encounter (Signed)
Can we increase her atenolol Thanks

## 2015-11-22 NOTE — Telephone Encounter (Signed)
To Dr. Klein to review. 

## 2015-11-22 NOTE — Telephone Encounter (Signed)
I called and spoke with the patient. Confirmed she is taking losartan, not atenolol. She has not taken napraoxyn since Tuesday due to increased heart rate. She had an episode last night of tachycardia when she turned on her left side and again around 2 am. She reports she recently fell down 6 steps at work and landed on her right side/ tailbone.  She has not taken any robaxin. I advised her she could try the robaxin to see if this helps, then she could call us back and let us know if she is still having issues with her HR/ BP. She is agreeable.

## 2015-11-27 ENCOUNTER — Ambulatory Visit (INDEPENDENT_AMBULATORY_CARE_PROVIDER_SITE_OTHER): Payer: Self-pay | Admitting: Family Medicine

## 2015-11-27 ENCOUNTER — Telehealth: Payer: Self-pay

## 2015-11-27 ENCOUNTER — Encounter: Payer: Self-pay | Admitting: Family Medicine

## 2015-11-27 ENCOUNTER — Other Ambulatory Visit: Payer: Self-pay | Admitting: Internal Medicine

## 2015-11-27 VITALS — BP 146/98 | HR 77 | Temp 98.6°F | Wt 238.5 lb

## 2015-11-27 DIAGNOSIS — S300XXA Contusion of lower back and pelvis, initial encounter: Secondary | ICD-10-CM

## 2015-11-27 NOTE — Progress Notes (Signed)
Pre visit review using our clinic review tool, if applicable. No additional management support is needed unless otherwise documented below in the visit note. 

## 2015-11-27 NOTE — Patient Instructions (Addendum)
Use ibuprofen 800 mg three times daily as needed.  Use the norco at night.  Follow up as needed.  Take care  Dr. Adriana Simasook

## 2015-11-27 NOTE — Telephone Encounter (Signed)
Xanax rx faxed to [facility].

## 2015-11-28 DIAGNOSIS — S300XXA Contusion of lower back and pelvis, initial encounter: Secondary | ICD-10-CM | POA: Insufficient documentation

## 2015-11-28 NOTE — Assessment & Plan Note (Signed)
New problem. Patient with recent fall and injury to the low back and sacrum/buttock. Patient has had reported side effects to treatment and was given in the emergency department. Advised use of over-the-counter ibuprofen as she tolerates this well. PRN Vicodin, particularly at night. 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 She also wants to be able to do half days next week. Work note given.

## 2015-11-28 NOTE — Progress Notes (Signed)
Subjective:  Patient ID: Brittany Brooks, female    DOB: 12-Jun-1965  Age: 50 y.o. MRN: [id number]  CC: ED Follow up  HPI:  50 year old female presents for ED follow-up.  Patient suffered a fall on 6/9. She fell down approximate 5 steps at work, injuring her back and buttocks/sacrum. She was seen in the ED regarding this. X-rays were taken of her lumbar spine as well as her sacrum/coccyx. X-rays were negative. She was treated conservatively with Robaxin, naproxen, and Vicodin. She was discharged home in stable condition.  Patient resents today for follow-up. She reports continued severe pain particularly of her right buttock and sacrum. She states that the area is bruised, numb, swollen. She's having difficulty sitting down on her bottom. She states that she took some of the medication but felt like it made her heart rate go up as well as her blood pressure. She's recently been using just ibuprofen and Epsom salt baths/soaks. She's had minimal improvement since her injury. She states that the pain is exacerbated sitting/pressure on the area. She like to discuss treatment options today as well as to determine what she is to do about returning to work.  Social Hx   Social History   Social History  . Marital Status: Married    Spouse Name: N/A  . Number of Children: N/A  . Years of Education: N/A   Social History Main Topics  . Smoking status: Never Smoker   . Smokeless tobacco: Never Used  . Alcohol Use: 2.4 oz/week    4 Glasses of wine per week  . Drug Use: No  . Sexual Activity: Yes   Other Topics Concern  . None   Social History Narrative   Review of Systems  Constitutional: Negative.   Musculoskeletal:       Back pain; pain of sacrum and buttock.   Objective:  BP 146/98 mmHg  Pulse 77  Temp(Src) 98.6 F (37 C) (Oral)  Wt 238 lb 8 oz (108.183 kg)  SpO2 98%  LMP 10/16/2015  BP/Weight 11/27/2015 11/16/2015 10/18/2015  Systolic BP 146 142 128  Diastolic BP 98 75 86  Wt.  (Lbs) 238.5 230 238.5  BMI 38.51 37.14 38.51   Physical Exam  Constitutional: She is oriented to person, place, and time. She appears well-developed. No distress.  Pulmonary/Chest: Effort normal.  Musculoskeletal:  Tenderness over sacrum and buttock (right). Swelling and bruising noted over this area as well.  Neurological: She is alert and oriented to person, place, and time.  Skin:  Left buttock and area overlying the sacrum with a large amount of bruising. In various stages of healing.  Psychiatric: She has a normal mood and affect.  Vitals reviewed.  Lab Results  Component Value Date   WBC 10.4 05/17/2015   HGB 11.8* 08/16/2013   HCT 38.4 05/17/2015   PLT 342 05/17/2015   GLUCOSE 89 05/17/2015   CHOL 272* 09/15/2014   TRIG 283.0* 09/15/2014   HDL 39.90 09/15/2014   LDLDIRECT 166.0 09/15/2014   LDLCALC 139* 08/16/2013   ALT 27 05/17/2015   AST 21 05/17/2015   NA 140 05/17/2015   K 4.0 05/17/2015   CL 99 05/17/2015   CREATININE 0.89 05/17/2015   BUN 13 05/17/2015   CO2 23 05/17/2015   TSH 3.670 05/17/2015   HGBA1C 6.1* 05/17/2015   Assessment & Plan:   Problem List Items Addressed This Visit    Contusion, buttock - Primary    New problem. Patient with recent fall and  injury to the low back and sacrum/buttock. Patient has had reported side effects to treatment and was given in the emergency department. Advised use of over-the-counter ibuprofen as she tolerates this well. PRN Vicodin, particularly at night. 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 She also wants to be able to do half days next week. Work note given.        Follow-up: PRN  Everlene Other DO [facility]

## 2015-11-29 ENCOUNTER — Ambulatory Visit: Payer: BLUE CROSS/BLUE SHIELD | Admitting: *Deleted

## 2015-11-29 VITALS — BP 138/80 | HR 94 | Resp 18

## 2015-11-29 DIAGNOSIS — I10 Essential (primary) hypertension: Secondary | ICD-10-CM

## 2015-11-29 NOTE — Progress Notes (Signed)
Pt came in this afternoon for a BP check after seeing Dr. Adriana Simasook on 11/27/15. Pt was seen in clinic after a fall (buttock contusion) with a BP of 146/98.   Pt BP in office today was 138/90 (both arms-large cuff). It's worth mentioning that pt is still experiencing some pain & discomfort. She was unable to sit in chair completely also due to discomfort.   Pt states that BP was originally 150/100's after the fall initially occurred so its much better today.

## 2015-11-30 NOTE — Progress Notes (Signed)
Care was provided under my supervision. I agree with the note.  Anahlia Iseminger DO  

## 2015-12-02 NOTE — Progress Notes (Signed)
No rush to treat given circumstances.  Have patient check at home or at pharmacy in a week   I have reviewed the above information and agree with above.   Duncan Dulleresa Miangel Flom, MD

## 2015-12-03 ENCOUNTER — Encounter: Payer: Self-pay | Admitting: Internal Medicine

## 2015-12-03 ENCOUNTER — Encounter: Payer: Self-pay | Admitting: *Deleted

## 2015-12-03 NOTE — Telephone Encounter (Signed)
OK to give letter 

## 2015-12-03 NOTE — Telephone Encounter (Signed)
Letter written,  Please find out where /of patient wants it sent.

## 2015-12-05 ENCOUNTER — Ambulatory Visit (INDEPENDENT_AMBULATORY_CARE_PROVIDER_SITE_OTHER): Payer: BLUE CROSS/BLUE SHIELD

## 2015-12-05 ENCOUNTER — Other Ambulatory Visit: Payer: Self-pay | Admitting: Internal Medicine

## 2015-12-05 ENCOUNTER — Ambulatory Visit (INDEPENDENT_AMBULATORY_CARE_PROVIDER_SITE_OTHER): Payer: BLUE CROSS/BLUE SHIELD | Admitting: Internal Medicine

## 2015-12-05 ENCOUNTER — Other Ambulatory Visit: Payer: Self-pay

## 2015-12-05 ENCOUNTER — Encounter: Payer: Self-pay | Admitting: Internal Medicine

## 2015-12-05 VITALS — BP 128/74 | HR 96 | Temp 98.4°F | Resp 12 | Ht 66.0 in | Wt 242.2 lb

## 2015-12-05 DIAGNOSIS — S300XXA Contusion of lower back and pelvis, initial encounter: Secondary | ICD-10-CM | POA: Diagnosis not present

## 2015-12-05 DIAGNOSIS — S300XXS Contusion of lower back and pelvis, sequela: Secondary | ICD-10-CM | POA: Diagnosis not present

## 2015-12-05 DIAGNOSIS — M5410 Radiculopathy, site unspecified: Secondary | ICD-10-CM

## 2015-12-05 DIAGNOSIS — M5418 Radiculopathy, sacral and sacrococcygeal region: Secondary | ICD-10-CM

## 2015-12-05 NOTE — Telephone Encounter (Signed)
Patient scheduled for 430 p.m today. A voicemail was left stating she was on the schedule for today.

## 2015-12-05 NOTE — Progress Notes (Signed)
Subjective:  Patient ID: Brittany ReidKaren E Schoch, female    DOB: 02-18-1966  Age: 50 y.o. MRN: [id number]  CC: The primary encounter diagnosis was Radicular pain of sacrum. Diagnoses of Hematoma of sacrum, initial encounter and Contusion, buttock, sequela were also pertinent to this visit.  HPI Brittany Brooks presents for persistent right buttock pain and swelling after falling down a short flight of stairs which occurred at work on June 9th (nearly 3 weeks ago) .  She was taken to the ER for eval and treatment.  Lumbar and sacral X rays were negative at ER.  Since then she has been treating her contusion with ice and ibuprofen.  She has had minimal improvement and was seen by Dr Glendell Dockerooke on  Jun 20th with no change in therapy, but she was given a work excuse. She has started seeing the chiropractor this week and feels marginally better with regard to the pain radiating down her right leg.  However, she continues to have painful focal swelling that is making it difficult to sit on her right side.    Outpatient Prescriptions Prior to Visit  Medication Sig Dispense Refill  . ALPRAZolam (XANAX) 0.25 MG tablet TAKE ONE TABLET AT BEDTIME 30 tablet 2  . Flaxseed, Linseed, (FLAX SEED OIL) 1000 MG CAPS Take 1,000 mg by mouth daily. Reported on 11/27/2015    . levothyroxine (SYNTHROID, LEVOTHROID) 125 MCG tablet TAKE ONE TABLET EVERY DAY 90 tablet 0  . losartan (COZAAR) 50 MG tablet Take 50 mg by mouth daily.    . methocarbamol (ROBAXIN) 500 MG tablet Take 1 tablet (500 mg total) by mouth every 6 (six) hours as needed for muscle spasms. 30 tablet 0  . Multiple Vitamin (MULTIVITAMIN) tablet Take 1 tablet by mouth daily.    Marland Kitchen. HYDROcodone-acetaminophen (NORCO) 5-325 MG tablet Take 1-2 tablets by mouth every 4 (four) hours as needed for moderate pain. (Patient not taking: Reported on 11/29/2015) 15 tablet 0  . naproxen (NAPROSYN) 500 MG tablet Take 1 tablet (500 mg total) by mouth 2 (two) times daily with a meal. (Patient  not taking: Reported on 11/29/2015) 60 tablet 0   No facility-administered medications prior to visit.    Review of Systems;  Patient denies headache, fevers, malaise, unintentional weight loss, skin rash, eye pain, sinus congestion and sinus pain, sore throat, dysphagia,  hemoptysis , cough, dyspnea, wheezing, chest pain, palpitations, orthopnea, edema, abdominal pain, nausea, melena, diarrhea, constipation, flank pain, dysuria, hematuria, urinary  Frequency, nocturia, numbness, tingling, seizures,  Focal weakness, Loss of consciousness,  Tremor, insomnia, depression, anxiety, and suicidal ideation.      Objective:  BP 128/74 mmHg  Pulse 96  Temp(Src) 98.4 F (36.9 C) (Oral)  Resp 12  Ht 5\' 6"  (1.676 m)  Wt 242 lb 4 oz (109.884 kg)  BMI 39.12 kg/m2  SpO2 98%  LMP 11/20/2015  BP Readings from Last 3 Encounters:  12/05/15 128/74  11/29/15 138/80  11/27/15 146/98    Wt Readings from Last 3 Encounters:  12/05/15 242 lb 4 oz (109.884 kg)  11/27/15 238 lb 8 oz (108.183 kg)  11/16/15 230 lb (104.327 kg)    General appearance: alert, cooperative and appears stated age Ears: normal TM's and external ear canals both ears Throat: lips, mucosa, and tongue normal; teeth and gums normal Neck: no adenopathy, no carotid bruit, supple, symmetrical, trachea midline and thyroid not enlarged, symmetric, no tenderness/mass/nodules Back: symmetric, no curvature. ROM normal. No CVA tenderness. Lungs: clear to auscultation bilaterally  Heart: regular rate and rhythm, S1, S2 normal, no murmur, click, rub or gallop Abdomen: soft, non-tender; bowel sounds normal; no masses,  no organomegaly Pulses: 2+ and symmetric Skin: Skin color, texture, turgor normal. No rashes or lesions Lymph nodes: Cervical, supraclavicular, and axillary nodes normal.  Lab Results  Component Value Date   HGBA1C 6.1* 05/17/2015   HGBA1C 6.1 07/13/2012    Lab Results  Component Value Date   CREATININE 0.89 05/17/2015    CREATININE 0.73 09/15/2014   CREATININE 0.8 08/16/2013    Lab Results  Component Value Date   WBC 10.4 05/17/2015   HGB 11.8* 08/16/2013   HCT 38.4 05/17/2015   PLT 342 05/17/2015   GLUCOSE 89 05/17/2015   CHOL 272* 09/15/2014   TRIG 283.0* 09/15/2014   HDL 39.90 09/15/2014   LDLDIRECT 166.0 09/15/2014   LDLCALC 139* 08/16/2013   ALT 27 05/17/2015   AST 21 05/17/2015   NA 140 05/17/2015   K 4.0 05/17/2015   CL 99 05/17/2015   CREATININE 0.89 05/17/2015   BUN 13 05/17/2015   CO2 23 05/17/2015   TSH 3.670 05/17/2015   HGBA1C 6.1* 05/17/2015    Dg Lumbar Spine 2-3 Views  11/16/2015  CLINICAL DATA:  Status post fall down 5 stairs today with onset of low back pain. Initial encounter. EXAM: LUMBAR SPINE - 2-3 VIEW COMPARISON:  None. FINDINGS: Mild convex left curvature may be positional. Vertebral body height and alignment are maintained. Intervertebral disc space height is normal. Cholecystectomy clips are noted. IMPRESSION: No acute abnormality. Electronically Signed   By: Drusilla Kannerhomas  Dalessio M.D.   On: 11/16/2015 11:27   Dg Sacrum/coccyx  11/16/2015  CLINICAL DATA:  Status post fall down 5 stairs today with onset of low back pain. Initial encounter. EXAM: SACRUM AND COCCYX - 2+ VIEW COMPARISON:  None. FINDINGS: There is no evidence of fracture or other focal bone lesions. IMPRESSION: Negative exam. Electronically Signed   By: Drusilla Kannerhomas  Dalessio M.D.   On: 11/16/2015 11:26    Assessment & Plan:   Problem List Items Addressed This Visit    Contusion, buttock    She appears to have developed a large hematoma of the right buttock that is making sitting very painful.  There has been no improvement in nearly 3 weeks, so she will likely require surgical evacuation .  Referral to Orthopedics is in process.        Other Visit Diagnoses    Radicular pain of sacrum    -  Primary    Relevant Orders    DG Sacrum/Coccyx    DG Sacrum/Coccyx    Hematoma of sacrum, initial encounter             I am having Ms. Adami maintain her multivitamin, Flax Seed Oil, losartan, levothyroxine, HYDROcodone-acetaminophen, naproxen, methocarbamol, and ALPRAZolam.  No orders of the defined types were placed in this encounter.    There are no discontinued medications.  Follow-up: No Follow-up on file.   Sherlene ShamsULLO, Abdo Denault L, MD

## 2015-12-05 NOTE — Progress Notes (Signed)
Pre-visit discussion using our clinic review tool. No additional management support is needed unless otherwise documented below in the visit note.  

## 2015-12-05 NOTE — Patient Instructions (Signed)
Max dose ibuprofen is 800 mg every 8   Max dose tylenol is 2000 mg divided    I recommend continuing half days for another week,  Until we can determine if there is a fluid collection or hematoma that needs to be drained

## 2015-12-06 ENCOUNTER — Encounter: Payer: Self-pay | Admitting: Internal Medicine

## 2015-12-06 NOTE — Assessment & Plan Note (Signed)
She appears to have developed a large hematoma of the right buttock that is making sitting very painful.  There has been no improvement in nearly 3 weeks, so she will likely require surgical evacuation .  Referral to Orthopedics is in process.

## 2015-12-08 ENCOUNTER — Encounter: Payer: Self-pay | Admitting: Internal Medicine

## 2015-12-13 ENCOUNTER — Ambulatory Visit (INDEPENDENT_AMBULATORY_CARE_PROVIDER_SITE_OTHER): Payer: BLUE CROSS/BLUE SHIELD | Admitting: Internal Medicine

## 2015-12-13 ENCOUNTER — Encounter: Payer: Self-pay | Admitting: Internal Medicine

## 2015-12-13 VITALS — BP 128/76 | HR 77 | Ht 66.0 in | Wt 235.0 lb

## 2015-12-13 DIAGNOSIS — R002 Palpitations: Secondary | ICD-10-CM | POA: Diagnosis not present

## 2015-12-13 DIAGNOSIS — I10 Essential (primary) hypertension: Secondary | ICD-10-CM | POA: Diagnosis not present

## 2015-12-13 DIAGNOSIS — I471 Supraventricular tachycardia: Secondary | ICD-10-CM | POA: Diagnosis not present

## 2015-12-13 DIAGNOSIS — R000 Tachycardia, unspecified: Secondary | ICD-10-CM | POA: Diagnosis not present

## 2015-12-13 MED ORDER — BISOPROLOL FUMARATE 5 MG PO TABS: 5.0000 mg | ORAL_TABLET | Freq: Every day | ORAL | Status: DC

## 2015-12-13 MED ORDER — PROPRANOLOL HCL ER 80 MG PO CP24: 80.0000 mg | ORAL_CAPSULE | Freq: Every day | ORAL | Status: DC

## 2015-12-13 MED ORDER — METOPROLOL SUCCINATE ER 50 MG PO TB24: 50.0000 mg | ORAL_TABLET | Freq: Every day | ORAL | Status: DC

## 2015-12-13 NOTE — Patient Instructions (Addendum)
Medication Instructions: - Your physician has recommended you make the following change in your medication: ** You have been given prescriptions for 3 different beta blockers to try. You may take them in any order, but DO NOT take more than one at a time. Try to give at least 2 weeks on each one, if possible, to see how you tolerate them. **  1) Inderal LA 80 mg once daily 2) Metoprolol succinate 50 mg once daily 3) Bisoprolol 5 mg once daily  Labwork: - none  Procedures/Testing: - none  Follow-Up: - Your physician recommends that you schedule a follow-up appointment in: 2-3 months with Dr. Graciela HusbandsKlein  Any Additional Special Instructions Will Be Listed Below (If Applicable).     If you need a refill on your cardiac medications before your next appointment, please call your pharmacy.

## 2015-12-13 NOTE — Progress Notes (Signed)
Patient Care Team: Sherlene Shamseresa L Tullo, MD as PCP - General (Internal Medicine)   HPI  Brittany ReidKaren E Brooks is a 50 y.o. female Seen in follow-up for nocturnal palpitations. She has had heart rates documented in the 130-50 range; P waves were similar to sinus but abrupt onset suggest the possibility of atrial tachycardia.   Myoview 2017 demonstrated normal LV function with no ischemia. Echocardiogram normal LV function trivial regurgitation mild left ventricular hypertrophy and a Holter monitor was obtained demonstrating 21 PVCs 100 PACs one 6 beat run of SVT. She also had nocturnal oximetry whichwas borderline  Catheterization [facility] was normal  Urine studies for adrenaline were normal  She has been better with her CPAP although she continues to have problems with palpitations.  She is also noted tachypalpitations with showers, caffeine.  We reviewed her event recorder      Past Medical History  Diagnosis Date  . Hypothyroidism     since early 20's, secondary to Hashimoto's  . Multinodular goiter (nontoxic)     [facility]  . Hyperlipidemia   . Pancreatitis 2000    secondary to cholangiogram during lap chole, Sankar  . Hypertension   . Dysrhythmia   . Sleep apnea     Past Surgical History  Procedure Laterality Date  . Cholecystectomy  2000    recurrent cholecystitis during pregnancy  . Cesarean section  Sept 2000  . Cardiac catheterization N/A 08/08/2015    Procedure: Left Heart Cath and Coronary Angiography;  Surgeon: Dalia HeadingKenneth A Fath, MD;  Location: [facility] INVASIVE CV LAB;  Service: Cardiovascular;  Laterality: N/A;    Current Outpatient Prescriptions  Medication Sig Dispense Refill  . ALPRAZolam (XANAX) 0.25 MG tablet TAKE ONE TABLET AT BEDTIME 30 tablet 2  . Flaxseed, Linseed, (FLAX SEED OIL) 1000 MG CAPS Take 1,000 mg by mouth daily. Reported on 11/27/2015    . HYDROcodone-acetaminophen (NORCO) 5-325 MG tablet Take 1-2 tablets by mouth every 4 (four)  hours as needed for moderate pain. 15 tablet 0  . levothyroxine (SYNTHROID, LEVOTHROID) 125 MCG tablet TAKE ONE TABLET EVERY DAY 90 tablet 0  . losartan (COZAAR) 50 MG tablet Take 50 mg by mouth daily.    . methocarbamol (ROBAXIN) 500 MG tablet Take 1 tablet (500 mg total) by mouth every 6 (six) hours as needed for muscle spasms. 30 tablet 0  . Multiple Vitamin (MULTIVITAMIN) tablet Take 1 tablet by mouth daily.    . naproxen (NAPROSYN) 500 MG tablet Take 1 tablet (500 mg total) by mouth 2 (two) times daily with a meal. 60 tablet 0   No current facility-administered medications for this visit.    Allergies  Allergen Reactions  . Lexapro [Escitalopram Oxalate] Other (See Comments)    Tachycardia       Review of Systems negative except from HPI and PMH  Physical Exam BP 128/76 mmHg  Pulse 77  Ht 5\' 6"  (1.676 m)  Wt 235 lb (106.595 kg)  BMI 37.95 kg/m2  LMP 11/20/2015 Well developed and well nourished in no acute distress HENT normal E scleral and icterus clear Neck Supple JVP flat; carotids brisk and full Clear to ausculation  Regular rate and rhythm, no murmurs gallops or rub Soft with active bowel sounds No clubbing cyanosis  Edema Alert and oriented, grossly normal motor and sensory function Skin Warm and Dry  ECG demonstrates sinus rhythm at 79 Intervals 13/07/36  Otherwise normal  Assessment and  Plan  Nocturnal palpitations  Sleep apnea   Hypertension   Blood pressure is better  Palpitations persisting will try beta blockers give Rx With metoprolol, Inderal and bisoprolol as she has failed atenolol in the past. If these fail, we will try calcium blockers and if they fail she would like to try noninvasive approach and so we would use flecainide prior to pursuing catheter ablation.  The palpitations that she is had with showers and with caffeine suggest adrenergic hypersensitivity/dysautonomia; however, against this was the abrupt onset that was noted on her  event recorder as opposed to a more gradual albeit rapid onset. I will have to be open to rethinking this  We spent more than 50% of our >25 min visit in face to face counseling regarding the above

## 2015-12-24 ENCOUNTER — Ambulatory Visit: Payer: Self-pay | Admitting: Internal Medicine

## 2016-01-09 DIAGNOSIS — R7989 Other specified abnormal findings of blood chemistry: Secondary | ICD-10-CM | POA: Diagnosis not present

## 2016-01-09 DIAGNOSIS — F410 Panic disorder [episodic paroxysmal anxiety] without agoraphobia: Secondary | ICD-10-CM | POA: Diagnosis not present

## 2016-01-09 DIAGNOSIS — R7889 Finding of other specified substances, not normally found in blood: Secondary | ICD-10-CM | POA: Diagnosis not present

## 2016-01-09 DIAGNOSIS — I10 Essential (primary) hypertension: Secondary | ICD-10-CM | POA: Diagnosis not present

## 2016-01-09 DIAGNOSIS — E032 Hypothyroidism due to medicaments and other exogenous substances: Secondary | ICD-10-CM | POA: Diagnosis not present

## 2016-01-09 DIAGNOSIS — R739 Hyperglycemia, unspecified: Secondary | ICD-10-CM | POA: Diagnosis not present

## 2016-01-09 DIAGNOSIS — I158 Other secondary hypertension: Secondary | ICD-10-CM | POA: Diagnosis not present

## 2016-02-01 ENCOUNTER — Encounter: Payer: Self-pay | Admitting: Internal Medicine

## 2016-02-02 DIAGNOSIS — G4733 Obstructive sleep apnea (adult) (pediatric): Secondary | ICD-10-CM | POA: Diagnosis not present

## 2016-02-08 DIAGNOSIS — G4733 Obstructive sleep apnea (adult) (pediatric): Secondary | ICD-10-CM | POA: Diagnosis not present

## 2016-02-08 DIAGNOSIS — R0981 Nasal congestion: Secondary | ICD-10-CM | POA: Diagnosis not present

## 2016-02-08 DIAGNOSIS — R430 Anosmia: Secondary | ICD-10-CM | POA: Diagnosis not present

## 2016-03-10 ENCOUNTER — Other Ambulatory Visit: Payer: Self-pay | Admitting: Orthopedic Surgery

## 2016-03-10 DIAGNOSIS — M533 Sacrococcygeal disorders, not elsewhere classified: Secondary | ICD-10-CM

## 2016-03-19 ENCOUNTER — Ambulatory Visit
Admission: RE | Admit: 2016-03-19 | Discharge: 2016-03-19 | Disposition: A | Discharge status: Home / Self Care | Payer: Worker's Compensation | Source: Ambulatory Visit | Attending: Orthopedic Surgery | Admitting: Orthopedic Surgery

## 2016-03-19 ENCOUNTER — Other Ambulatory Visit: Payer: Self-pay | Admitting: Orthopedic Surgery

## 2016-03-19 DIAGNOSIS — R937 Abnormal findings on diagnostic imaging of other parts of musculoskeletal system: Secondary | ICD-10-CM | POA: Insufficient documentation

## 2016-03-19 DIAGNOSIS — M47898 Other spondylosis, sacral and sacrococcygeal region: Secondary | ICD-10-CM | POA: Diagnosis not present

## 2016-03-19 DIAGNOSIS — M533 Sacrococcygeal disorders, not elsewhere classified: Secondary | ICD-10-CM | POA: Insufficient documentation

## 2016-03-20 DIAGNOSIS — G4733 Obstructive sleep apnea (adult) (pediatric): Secondary | ICD-10-CM | POA: Diagnosis not present

## 2016-04-19 DIAGNOSIS — Z23 Encounter for immunization: Secondary | ICD-10-CM | POA: Diagnosis not present

## 2016-05-08 ENCOUNTER — Ambulatory Visit (INDEPENDENT_AMBULATORY_CARE_PROVIDER_SITE_OTHER): Payer: BLUE CROSS/BLUE SHIELD | Admitting: Internal Medicine

## 2016-05-08 ENCOUNTER — Encounter: Payer: Self-pay | Admitting: Internal Medicine

## 2016-05-08 VITALS — BP 170/104 | HR 107 | Ht 66.0 in | Wt 247.5 lb

## 2016-05-08 DIAGNOSIS — I471 Supraventricular tachycardia: Secondary | ICD-10-CM

## 2016-05-08 DIAGNOSIS — R002 Palpitations: Secondary | ICD-10-CM

## 2016-05-08 DIAGNOSIS — I10 Essential (primary) hypertension: Secondary | ICD-10-CM | POA: Diagnosis not present

## 2016-05-08 MED ORDER — METOPROLOL SUCCINATE ER 50 MG PO TB24: ORAL_TABLET | ORAL | 11 refills | Status: DC

## 2016-05-08 NOTE — Patient Instructions (Signed)
Medication Instructions: - Your physician has recommended you make the following change in your medication:  1) Toprol 50 mg- take 1/2 tablet (25 mg) once daily- you may take an extra 25 mg as needed for fast heart rates  Labwork: - none ordered  Procedures/Testing: - none ordered  Follow-Up: - Your physician recommends that you schedule a follow-up appointment in: 4 months with Dr. Graciela HusbandsKlein.  Any Additional Special Instructions Will Be Listed Below (If Applicable).     If you need a refill on your cardiac medications before your next appointment, please call your pharmacy.

## 2016-05-08 NOTE — Progress Notes (Signed)
Patient Care Team: Sherlene Shamseresa L Tullo, MD as PCP - General (Internal Medicine)   HPI  Brittany Brooks is a 50 y.o. female Seen in follow-up for nocturnal palpitations. She has had heart rates documented in the 130-50 range; P waves were similar to sinus but abrupt onset suggest the possibility of atrial tachycardia.   Myoview 2017 demonstrated normal LV function with no ischemia. Echocardiogram normal LV function trivial regurgitation mild left ventricular hypertrophy and a Holter monitor was obtained demonstrating 21 PVCs 100 PACs one 6 beat run of SVT. She also had nocturnal oximetry whichwas borderline  [facility]ization [facility] was normal  Urine studies for adrenaline were normal  She has been better with her CPAP although she continues to have problems with palpitations.  She is also noted tachypalpitations with showers, caffeine.   She was much improved on metoprolol. She found the 25 mg daily sufficient. She infrequently has more symptoms. This is around her periods and also GI issues area    Past Medical History:  Diagnosis Date  . Dysrhythmia   . Hyperlipidemia   . Hypertension   . Hypothyroidism    since early 20's, secondary to Hashimoto's  . Multinodular goiter (nontoxic)    [facility]  . Pancreatitis 2000   secondary to cholangiogram during lap chole, Sankar  . Sleep apnea     Past Surgical History:  Procedure Laterality Date  . CARDIAC CATHETERIZATION N/A 08/08/2015   Procedure: Left Heart Cath and Coronary Angiography;  Surgeon: Dalia HeadingKenneth A Fath, MD;  Location: [facility] INVASIVE CV LAB;  Service: Cardiovascular;  Laterality: N/A;  . CESAREAN SECTION  Sept 2000  . CHOLECYSTECTOMY  2000   recurrent cholecystitis during pregnancy    Current Outpatient Prescriptions  Medication Sig Dispense Refill  . ALPRAZolam (XANAX) 0.25 MG tablet TAKE ONE TABLET AT BEDTIME 30 tablet 2  . levothyroxine (SYNTHROID, LEVOTHROID) 175 MCG tablet Take 175 mcg by  mouth daily before breakfast.    . metoprolol succinate (TOPROL-XL) 50 MG 24 hr tablet Take 1 tablet (50 mg total) by mouth daily. Take with or immediately following a meal. 30 tablet 0  . Multiple Vitamin (MULTIVITAMIN) tablet Take 1 tablet by mouth daily.     No current facility-administered medications for this visit.     Allergies  Allergen Reactions  . Lexapro [Escitalopram Oxalate] Other (See Comments)    Tachycardia       Review of Systems negative except from HPI and PMH  Physical Exam BP (!) 170/104 (BP Location: Left Arm, Patient Position: Sitting, Cuff Size: Normal)   Pulse (!) 107   Ht 5\' 6"  (1.676 m)   Wt 247 lb 8 oz (112.3 kg)   BMI 39.95 kg/m    Repeat blood pressure left arm 140/palpation Well developed and well nourished in no acute distress HENT normal E scleral and icterus clear Neck Supple JVP flat; carotids brisk and full Clear to ausculation  Regular rate and rhythm, no murmurs gallops or rub Soft with active bowel sounds No clubbing cyanosis  Edema Alert and oriented, grossly normal motor and sensory function Skin Warm and Dry  ECG demonstrates sinus rhythm at 107  17/07/30  P wave morphology was unchanged from ECG 7/17 with a heart rate of 76 i.e. monophasic in lead V1    Assessment and  Plan  Nocturnal palpitations   Sleep apnea   Hypertension   Treated hypothyroidism   She has done really well with the metoprolol. I  think we are sure to take an extra 25 mg as needed.  We will have her follow-up with Dr. Dario GuardianJadali  to check TSH as it is then she thinks about 6 months.  I remain uncertain as to whether this is a primary arrhythmia or dysautonomic. Her recent complaint of GI associated symptoms or if the idea that he is autonomic. The ECG today with a P wave morphology similar to heart rate is 75 autonomic mechanism.    We spent more than 50% of our >25 min visit in face to face counseling regarding the above

## 2016-05-14 ENCOUNTER — Encounter: Payer: Self-pay | Admitting: Internal Medicine

## 2016-05-16 ENCOUNTER — Telehealth: Payer: Self-pay | Admitting: Internal Medicine

## 2016-05-16 NOTE — Telephone Encounter (Signed)
Pt states she has upper respiratory issues going on , and not sure what OTC medications she can take? Please call. States she may go to her ENT, and not sure which antibiotic she can take.

## 2016-05-16 NOTE — Telephone Encounter (Signed)
Pt reports taking mucinex DM x 2 weeks per her local pharmacists suggestion. She inquires if this is ok. Pt has hx of HTN and palpitations for which she takes metoprolol succinate 50mg  qd. Reviewed acceptable medications w/Dr. Kirke CorinArida.  Pt should avoid Mucinex D.  Pt verbalized understanding and states she will make ENT appt for sx.

## 2016-05-19 ENCOUNTER — Ambulatory Visit: Payer: Self-pay | Admitting: Family

## 2016-05-19 NOTE — Progress Notes (Deleted)
   Subjective:    Patient ID: Brittany Brooks, female    DOB: 1965-10-17, 50 y.o.   MRN: [id number]  CC: Brittany Brooks is a 50 y.o. female who presents today for an acute visit.    HPI: CC: cough and congestion    HISTORY:  Past Medical History:  Diagnosis Date  . Dysrhythmia   . Hyperlipidemia   . Hypertension   . Hypothyroidism    since early 20's, secondary to Hashimoto's  . Multinodular goiter (nontoxic)    [facility]  . Pancreatitis 2000   secondary to cholangiogram during lap chole, Sankar  . Sleep apnea    Past Surgical History:  Procedure Laterality Date  . CARDIAC CATHETERIZATION N/A 08/08/2015   Procedure: Left Heart Cath and Coronary Angiography;  Surgeon: Dalia HeadingKenneth A Fath, MD;  Location: [facility] INVASIVE CV LAB;  Service: Cardiovascular;  Laterality: N/A;  . CESAREAN SECTION  Sept 2000  . CHOLECYSTECTOMY  2000   recurrent cholecystitis during pregnancy   Family History  Problem Relation Age of Onset  . Hyperlipidemia Mother   . Hypertension Mother   . COPD Mother   . Hyperlipidemia Father   . Heart disease Father 953    CABG x 4  . Diabetes Father   . Stroke Father     TIA  . COPD Father   . Cancer Neg Hx     Allergies: Lexapro [escitalopram oxalate] Current Outpatient Prescriptions on File Prior to Visit  Medication Sig Dispense Refill  . ALPRAZolam (XANAX) 0.25 MG tablet TAKE ONE TABLET AT BEDTIME 30 tablet 2  . levothyroxine (SYNTHROID, LEVOTHROID) 175 MCG tablet Take 175 mcg by mouth daily before breakfast.    . metoprolol succinate (TOPROL-XL) 50 MG 24 hr tablet Take 1/2 tablet (25 mg) by mouth once daily- you may take an extra 1/2 tablet (25 mg) by mouth daily as needed for palpitations 30 tablet 11  . Multiple Vitamin (MULTIVITAMIN) tablet Take 1 tablet by mouth daily.     No current facility-administered medications on file prior to visit.     Social History  Substance Use Topics  . Smoking status: Never Smoker  . Smokeless  tobacco: Never Used  . Alcohol use 2.4 oz/week    4 Glasses of wine per week    Review of Systems    Objective:    There were no vitals taken for this visit.   Physical Exam     Assessment & Plan:      I am having Ms. Witt maintain her multivitamin, ALPRAZolam, levothyroxine, and metoprolol succinate.   No orders of the defined types were placed in this encounter.   Return precautions given.   Risks, benefits, and alternatives of the medications and treatment plan prescribed today were discussed, and patient expressed understanding.   Education regarding symptom management and diagnosis given to patient on AVS.  Continue to follow with TULLO, Mar DaringERESA L, MD for routine health maintenance.   Brittany Brooks and I agreed with plan.   Rennie PlowmanMargaret Galadriel Shroff, FNP

## 2016-05-22 ENCOUNTER — Encounter: Payer: Self-pay | Admitting: Obstetrics and Gynecology

## 2016-05-22 ENCOUNTER — Ambulatory Visit (INDEPENDENT_AMBULATORY_CARE_PROVIDER_SITE_OTHER): Payer: BLUE CROSS/BLUE SHIELD | Admitting: Obstetrics and Gynecology

## 2016-05-22 VITALS — Ht 66.0 in | Wt 246.0 lb

## 2016-05-22 DIAGNOSIS — Z01419 Encounter for gynecological examination (general) (routine) without abnormal findings: Secondary | ICD-10-CM

## 2016-05-22 MED ORDER — ALPRAZOLAM 0.25 MG PO TABS: 0.2500 mg | ORAL_TABLET | Freq: Every day | ORAL | 2 refills | Status: DC

## 2016-05-22 NOTE — Progress Notes (Signed)
Subjective:   Brittany Brooks is a 50 y.o. G1P0 Caucasian female here for a routine well-woman exam.  Patient's last menstrual period was 05/21/2016.    Current complaints: diagnosed with sleep apnea and is having panic attacks.  PCP: Marikay AlarKlien       does desire labs  Social History: Sexual: heterosexual Marital Status: married Living situation: with spouse Occupation: unknown occupation Tobacco/alcohol: no tobacco use Illicit drugs: no history of illicit drug use  The following portions of the patient's history were reviewed and updated as appropriate: allergies, current medications, past family history, past medical history, past social history, past surgical history and problem list.  Past Medical History Past Medical History:  Diagnosis Date  . Dysrhythmia   . Hyperlipidemia   . Hypertension   . Hypothyroidism    since early 20's, secondary to Hashimoto's  . Multinodular goiter (nontoxic)    [facility]  . Pancreatitis 2000   secondary to cholangiogram during lap chole, Sankar  . Sleep apnea     Past Surgical History Past Surgical History:  Procedure Laterality Date  . CARDIAC CATHETERIZATION N/A 08/08/2015   Procedure: Left Heart Cath and Coronary Angiography;  Surgeon: Dalia HeadingKenneth A Fath, MD;  Location: [facility] INVASIVE CV LAB;  Service: Cardiovascular;  Laterality: N/A;  . CESAREAN SECTION  Sept 2000  . CHOLECYSTECTOMY  2000   recurrent cholecystitis during pregnancy    Gynecologic History G1P0  Patient's last menstrual period was 05/21/2016. Contraception: abstinence Last Pap: 2015. Results were: normal Last mammogram: 2014. Results were: normal   Obstetric History OB History  Gravida Para Term Preterm AB Living  1         1  SAB TAB Ectopic Multiple Live Births          1    # Outcome Date GA Lbr Len/2nd Weight Sex Delivery Anes PTL Lv  1 Gravida 2000    M CS-Unspec   LIV      Current Medications Current Outpatient Prescriptions on File Prior to  Visit  Medication Sig Dispense Refill  . ALPRAZolam (XANAX) 0.25 MG tablet TAKE ONE TABLET AT BEDTIME 30 tablet 2  . levothyroxine (SYNTHROID, LEVOTHROID) 175 MCG tablet Take 175 mcg by mouth daily before breakfast.    . metoprolol succinate (TOPROL-XL) 50 MG 24 hr tablet Take 1/2 tablet (25 mg) by mouth once daily- you may take an extra 1/2 tablet (25 mg) by mouth daily as needed for palpitations 30 tablet 11  . Multiple Vitamin (MULTIVITAMIN) tablet Take 1 tablet by mouth daily.     No current facility-administered medications on file prior to visit.     Review of Systems Patient denies any headaches, blurred vision, shortness of breath, chest pain, abdominal pain, problems with bowel movements, urination, or intercourse.  Objective:  Ht 5\' 6"  (1.676 m)   Wt 246 lb (111.6 kg)   LMP 05/21/2016   BMI 39.71 kg/m  Physical Exam  General:  Well developed, well nourished, no acute distress. She is alert and oriented x3. Skin:  Warm and dry Neck:  Midline trachea, no thyromegaly or nodules Cardiovascular: Regular rate and rhythm, no murmur heard Lungs:  Effort normal, all lung fields clear to auscultation bilaterally Breasts:  No dominant palpable mass, retraction, or nipple discharge Abdomen:  Soft, non tender, no hepatosplenomegaly or mass  Extremities:  No swelling or varicosities noted Psych:  She has a normal mood and affect  Assessment:   Healthy well-woman exam Obesity HTN Vitamin d  deficiency  Plan:  Labs obtained F/U 1 year for AE, or sooner if needed Mammogram ordered  Brittany Brooks Suzan NailerN Issam Carlyon, CNM

## 2016-05-23 LAB — VITAMIN D 25 HYDROXY (VIT D DEFICIENCY, FRACTURES): VIT D 25 HYDROXY: 18.6 ng/mL — AB (ref 30.0–100.0)

## 2016-05-23 LAB — COMPREHENSIVE METABOLIC PANEL
ALT: 22 IU/L (ref 0–32)
AST: 20 IU/L (ref 0–40)
Albumin/Globulin Ratio: 1.4 (ref 1.2–2.2)
Albumin: 4.4 g/dL (ref 3.5–5.5)
Alkaline Phosphatase: 65 IU/L (ref 39–117)
BUN/Creatinine Ratio: 19 (ref 9–23)
BUN: 13 mg/dL (ref 6–24)
Bilirubin Total: <0.2 mg/dL (ref 0.0–1.2)
CALCIUM: 9.4 mg/dL (ref 8.7–10.2)
CHLORIDE: 99 mmol/L (ref 96–106)
CO2: 25 mmol/L (ref 18–29)
Creatinine, Ser: 0.7 mg/dL (ref 0.57–1.00)
GFR, EST AFRICAN AMERICAN: 118 mL/min/{1.73_m2} (ref >59)
GFR, EST NON AFRICAN AMERICAN: 103 mL/min/{1.73_m2} (ref >59)
GLUCOSE: 86 mg/dL (ref 65–99)
Globulin, Total: 3.2 g/dL (ref 1.5–4.5)
POTASSIUM: 4.3 mmol/L (ref 3.5–5.2)
Sodium: 141 mmol/L (ref 134–144)
TOTAL PROTEIN: 7.6 g/dL (ref 6.0–8.5)

## 2016-05-23 LAB — CBC
Hematocrit: 36.9 % (ref 34.0–46.6)
Hemoglobin: 12.5 g/dL (ref 11.1–15.9)
MCH: 27.7 pg (ref 26.6–33.0)
MCHC: 33.9 g/dL (ref 31.5–35.7)
MCV: 82 fL (ref 79–97)
PLATELETS: 315 10*3/uL (ref 150–379)
RBC: 4.51 x10E6/uL (ref 3.77–5.28)
RDW: 14.6 % (ref 12.3–15.4)
WBC: 10.2 10*3/uL (ref 3.4–10.8)

## 2016-05-23 LAB — LIPID PANEL
CHOL/HDL RATIO: 8.8 ratio — AB (ref 0.0–4.4)
Cholesterol, Total: 281 mg/dL — ABNORMAL HIGH (ref 100–199)
HDL: 32 mg/dL — AB (ref >39)
TRIGLYCERIDES: 587 mg/dL — AB (ref 0–149)

## 2016-05-23 LAB — HEMOGLOBIN A1C
Est. average glucose Bld gHb Est-mCnc: 120 mg/dL
HEMOGLOBIN A1C: 5.8 % — AB (ref 4.8–5.6)

## 2016-05-23 LAB — THYROID PANEL WITH TSH
FREE THYROXINE INDEX: 1.7 (ref 1.2–4.9)
T3 UPTAKE RATIO: 22 % — AB (ref 24–39)
T4 TOTAL: 7.5 ug/dL (ref 4.5–12.0)
TSH: 4.82 u[IU]/mL — ABNORMAL HIGH (ref 0.450–4.500)

## 2016-05-23 LAB — IRON: Iron: 49 ug/dL (ref 27–159)

## 2016-05-28 ENCOUNTER — Other Ambulatory Visit: Payer: Self-pay | Admitting: Obstetrics and Gynecology

## 2016-05-28 MED ORDER — VITAMIN D (ERGOCALCIFEROL) 1.25 MG (50000 UNIT) PO CAPS: 50000.0000 [IU] | ORAL_CAPSULE | ORAL | 1 refills | Status: DC

## 2016-07-07 ENCOUNTER — Ambulatory Visit
Admission: RE | Admit: 2016-07-07 | Discharge: 2016-07-07 | Disposition: A | Discharge status: Home / Self Care | Payer: BLUE CROSS/BLUE SHIELD | Source: Ambulatory Visit | Attending: Obstetrics and Gynecology | Admitting: Obstetrics and Gynecology

## 2016-07-07 DIAGNOSIS — Z1231 Encounter for screening mammogram for malignant neoplasm of breast: Secondary | ICD-10-CM | POA: Insufficient documentation

## 2016-07-07 DIAGNOSIS — Z01419 Encounter for gynecological examination (general) (routine) without abnormal findings: Secondary | ICD-10-CM

## 2016-07-16 DIAGNOSIS — B078 Other viral warts: Secondary | ICD-10-CM | POA: Diagnosis not present

## 2016-07-16 DIAGNOSIS — L821 Other seborrheic keratosis: Secondary | ICD-10-CM | POA: Diagnosis not present

## 2016-09-02 DIAGNOSIS — D492 Neoplasm of unspecified behavior of bone, soft tissue, and skin: Secondary | ICD-10-CM | POA: Diagnosis not present

## 2016-09-02 DIAGNOSIS — L821 Other seborrheic keratosis: Secondary | ICD-10-CM | POA: Diagnosis not present

## 2016-09-02 DIAGNOSIS — D1800 Hemangioma unspecified site: Secondary | ICD-10-CM | POA: Diagnosis not present

## 2016-09-04 ENCOUNTER — Ambulatory Visit: Payer: Self-pay | Admitting: Internal Medicine

## 2016-09-23 DIAGNOSIS — I158 Other secondary hypertension: Secondary | ICD-10-CM | POA: Diagnosis not present

## 2016-09-23 DIAGNOSIS — F410 Panic disorder [episodic paroxysmal anxiety] without agoraphobia: Secondary | ICD-10-CM | POA: Diagnosis not present

## 2016-09-23 DIAGNOSIS — E039 Hypothyroidism, unspecified: Secondary | ICD-10-CM | POA: Diagnosis not present

## 2016-09-23 DIAGNOSIS — R7889 Finding of other specified substances, not normally found in blood: Secondary | ICD-10-CM | POA: Diagnosis not present

## 2016-09-29 DIAGNOSIS — R7889 Finding of other specified substances, not normally found in blood: Secondary | ICD-10-CM | POA: Diagnosis not present

## 2016-09-29 DIAGNOSIS — E032 Hypothyroidism due to medicaments and other exogenous substances: Secondary | ICD-10-CM | POA: Diagnosis not present

## 2016-09-29 DIAGNOSIS — E559 Vitamin D deficiency, unspecified: Secondary | ICD-10-CM | POA: Diagnosis not present

## 2016-09-29 DIAGNOSIS — F410 Panic disorder [episodic paroxysmal anxiety] without agoraphobia: Secondary | ICD-10-CM | POA: Diagnosis not present

## 2016-10-30 ENCOUNTER — Ambulatory Visit: Payer: Self-pay | Admitting: Internal Medicine

## 2016-12-09 DIAGNOSIS — M9903 Segmental and somatic dysfunction of lumbar region: Secondary | ICD-10-CM | POA: Diagnosis not present

## 2016-12-09 DIAGNOSIS — M5386 Other specified dorsopathies, lumbar region: Secondary | ICD-10-CM | POA: Diagnosis not present

## 2016-12-09 DIAGNOSIS — G5721 Lesion of femoral nerve, right lower limb: Secondary | ICD-10-CM | POA: Diagnosis not present

## 2016-12-17 DIAGNOSIS — M5386 Other specified dorsopathies, lumbar region: Secondary | ICD-10-CM | POA: Diagnosis not present

## 2016-12-17 DIAGNOSIS — G5721 Lesion of femoral nerve, right lower limb: Secondary | ICD-10-CM | POA: Diagnosis not present

## 2016-12-17 DIAGNOSIS — M9903 Segmental and somatic dysfunction of lumbar region: Secondary | ICD-10-CM | POA: Diagnosis not present

## 2016-12-17 DIAGNOSIS — M531 Cervicobrachial syndrome: Secondary | ICD-10-CM | POA: Diagnosis not present

## 2016-12-25 ENCOUNTER — Ambulatory Visit: Payer: Self-pay | Admitting: Internal Medicine

## 2017-01-28 DIAGNOSIS — M9903 Segmental and somatic dysfunction of lumbar region: Secondary | ICD-10-CM | POA: Diagnosis not present

## 2017-01-28 DIAGNOSIS — M9902 Segmental and somatic dysfunction of thoracic region: Secondary | ICD-10-CM | POA: Diagnosis not present

## 2017-01-30 DIAGNOSIS — M9903 Segmental and somatic dysfunction of lumbar region: Secondary | ICD-10-CM | POA: Diagnosis not present

## 2017-01-30 DIAGNOSIS — M9902 Segmental and somatic dysfunction of thoracic region: Secondary | ICD-10-CM | POA: Diagnosis not present

## 2017-02-12 ENCOUNTER — Ambulatory Visit: Payer: Self-pay | Admitting: Internal Medicine

## 2017-03-12 ENCOUNTER — Encounter: Payer: Self-pay | Admitting: Internal Medicine

## 2017-03-12 ENCOUNTER — Ambulatory Visit (INDEPENDENT_AMBULATORY_CARE_PROVIDER_SITE_OTHER): Payer: BLUE CROSS/BLUE SHIELD | Admitting: Internal Medicine

## 2017-03-12 ENCOUNTER — Ambulatory Visit (INDEPENDENT_AMBULATORY_CARE_PROVIDER_SITE_OTHER): Payer: BLUE CROSS/BLUE SHIELD

## 2017-03-12 VITALS — BP 146/100 | HR 90 | Temp 98.2°F | Resp 16 | Ht 66.0 in | Wt 241.4 lb

## 2017-03-12 DIAGNOSIS — R1032 Left lower quadrant pain: Secondary | ICD-10-CM | POA: Diagnosis not present

## 2017-03-12 DIAGNOSIS — I10 Essential (primary) hypertension: Secondary | ICD-10-CM

## 2017-03-12 DIAGNOSIS — K625 Hemorrhage of anus and rectum: Secondary | ICD-10-CM | POA: Diagnosis not present

## 2017-03-12 DIAGNOSIS — R1030 Lower abdominal pain, unspecified: Secondary | ICD-10-CM

## 2017-03-12 DIAGNOSIS — K5732 Diverticulitis of large intestine without perforation or abscess without bleeding: Secondary | ICD-10-CM

## 2017-03-12 DIAGNOSIS — R1031 Right lower quadrant pain: Secondary | ICD-10-CM | POA: Diagnosis not present

## 2017-03-12 HISTORY — DX: Diverticulitis of large intestine without perforation or abscess without bleeding: K57.32

## 2017-03-12 LAB — SEDIMENTATION RATE: Sed Rate: 68 mm/hr — ABNORMAL HIGH (ref 0–20)

## 2017-03-12 LAB — CBC WITH DIFFERENTIAL/PLATELET
Basophils Absolute: 0.1 10*3/uL (ref 0.0–0.1)
Basophils Relative: 0.5 % (ref 0.0–3.0)
EOS PCT: 2.1 % (ref 0.0–5.0)
Eosinophils Absolute: 0.3 10*3/uL (ref 0.0–0.7)
HCT: 35.3 % — ABNORMAL LOW (ref 36.0–46.0)
Hemoglobin: 11.3 g/dL — ABNORMAL LOW (ref 12.0–15.0)
LYMPHS ABS: 1.9 10*3/uL (ref 0.7–4.0)
Lymphocytes Relative: 15.6 % (ref 12.0–46.0)
MCHC: 32 g/dL (ref 30.0–36.0)
MCV: 80 fl (ref 78.0–100.0)
MONO ABS: 0.9 10*3/uL (ref 0.1–1.0)
Monocytes Relative: 7.6 % (ref 3.0–12.0)
NEUTROS PCT: 74.2 % (ref 43.0–77.0)
Neutro Abs: 9.2 10*3/uL — ABNORMAL HIGH (ref 1.4–7.7)
Platelets: 341 10*3/uL (ref 150.0–400.0)
RBC: 4.41 Mil/uL (ref 3.87–5.11)
RDW: 15.9 % — ABNORMAL HIGH (ref 11.5–15.5)
WBC: 12.4 10*3/uL — ABNORMAL HIGH (ref 4.0–10.5)

## 2017-03-12 LAB — COMPREHENSIVE METABOLIC PANEL
ALK PHOS: 62 U/L (ref 39–117)
ALT: 15 U/L (ref 0–35)
AST: 14 U/L (ref 0–37)
Albumin: 4 g/dL (ref 3.5–5.2)
BUN: 13 mg/dL (ref 6–23)
CHLORIDE: 102 meq/L (ref 96–112)
CO2: 27 meq/L (ref 19–32)
Calcium: 9.5 mg/dL (ref 8.4–10.5)
Creatinine, Ser: 0.68 mg/dL (ref 0.40–1.20)
GFR: 97.8 mL/min (ref >60.00)
GLUCOSE: 95 mg/dL (ref 70–99)
Potassium: 4.1 mEq/L (ref 3.5–5.1)
SODIUM: 136 meq/L (ref 135–145)
TOTAL PROTEIN: 8 g/dL (ref 6.0–8.3)
Total Bilirubin: 0.5 mg/dL (ref 0.2–1.2)

## 2017-03-12 LAB — POCT URINALYSIS DIPSTICK
BILIRUBIN UA: NEGATIVE
Blood, UA: NEGATIVE
GLUCOSE UA: NEGATIVE
KETONES UA: NEGATIVE
LEUKOCYTES UA: NEGATIVE
Nitrite, UA: NEGATIVE
Protein, UA: 30
Spec Grav, UA: 1.02 (ref 1.010–1.025)
Urobilinogen, UA: 0.2 E.U./dL
pH, UA: 6.5 (ref 5.0–8.0)

## 2017-03-12 LAB — POCT URINE PREGNANCY: Preg Test, Ur: NEGATIVE

## 2017-03-12 MED ORDER — CIPROFLOXACIN HCL 500 MG PO TABS: 500.0000 mg | ORAL_TABLET | Freq: Two times a day (BID) | ORAL | 0 refills | Status: DC

## 2017-03-12 MED ORDER — METRONIDAZOLE 500 MG PO TABS: 500.0000 mg | ORAL_TABLET | Freq: Three times a day (TID) | ORAL | 0 refills | Status: DC

## 2017-03-12 NOTE — Patient Instructions (Signed)
Your lower abdominal pain and feverish symptoms may be coming from diverticulitis or (less likely) appendicitis, which are both infections.  If your labs today suggest an infection ,  I will start you on medications to treat this.  If the CT scan suggests appendicitis ,  You will need to see a surgeon  Immediately

## 2017-03-12 NOTE — Progress Notes (Signed)
Subjective:  Patient ID: Brittany Brooks, female    DOB: Mar 19, 1966  Age: 51 y.o. MRN: [id number]  CC: The primary encounter diagnosis was Lower abdominal pain. Diagnoses of Abdominal pain, bilateral lower quadrant and Essential hypertension were also pertinent to this visit.  HPI Brittany Brooks presents for evaluation of lower abdominal pain and absence of normal stool pattern since Monday .  Usual BM regimen  is 4 to 5 soft or loose stools daily attributed to IBS .  Aggravated by  Menses.  Periods have been irregular over the last 3  Months.  History Started passing some mucus.  Occasional BRBPR for the ast several months,  Which has progressed to small clots,  Attributed to hemorrhoids  On Monday:  Started having Lower abd pain and feeling feverish . Worse overnight,  Intense abdominal pain  Acc by sweats and chills.  .  NO BM since Monday 1 pm.   No nausea this week until today .  No appetite.  Has not been taking any laxatives  But drink 2 ounce of distilled Aloe prior to symptoms started.   Hypertension : noted in November and metoprolol dose increased by Graciela Husbands for control of SVT   Outpatient Medications Prior to Visit  Medication Sig Dispense Refill  . ALPRAZolam (XANAX) 0.25 MG tablet Take 1 tablet (0.25 mg total) by mouth at bedtime. 30 tablet 2  . levothyroxine (SYNTHROID, LEVOTHROID) 175 MCG tablet Take 175 mcg by mouth daily before breakfast.    . metoprolol succinate (TOPROL-XL) 50 MG 24 hr tablet Take 1/2 tablet (25 mg) by mouth once daily- you may take an extra 1/2 tablet (25 mg) by mouth daily as needed for palpitations 30 tablet 11  . Multiple Vitamin (MULTIVITAMIN) tablet Take 1 tablet by mouth daily.    . Vitamin D, Ergocalciferol, (DRISDOL) 50000 units CAPS capsule Take 1 capsule (50,000 Units total) by mouth 2 (two) times a week. (Patient not taking: Reported on 03/12/2017) 30 capsule 1   No facility-administered medications prior to visit.     Review of  Systems;  Patient denies headache, fevers, malaise, unintentional weight loss, skin rash, eye pain, sinus congestion and sinus pain, sore throat, dysphagia,  hemoptysis , cough, dyspnea, wheezing, chest pain, palpitations, orthopnea, edema,nausea, melena, diarrhea, constipation, flank pain, dysuria, hematuria, urinary  Frequency, nocturia, numbness, tingling, seizures,  Focal weakness, Loss of consciousness,  Tremor, insomnia, depression, anxiety, and suicidal ideation.      Objective:  BP (!) 146/100 (BP Location: Right Arm, Patient Position: Sitting, Cuff Size: Large)   Pulse 90   Temp 98.2 F (36.8 C) (Oral)   Resp 16   Ht  (1.676 m)   Wt 241 lb 6.4 oz (109.5 kg)   SpO2 97%   BMI 38.96 kg/m   BP Readings from Last 3 Encounters:  03/12/17 (!) 146/100  05/08/16 (!) 170/104  12/13/15 128/76    Wt Readings from Last 3 Encounters:  03/12/17 241 lb 6.4 oz (109.5 kg)  05/22/16 246 lb (111.6 kg)  05/08/16 247 lb 8 oz (112.3 kg)    General appearance: alert, cooperative and appears stated age Ears: normal TM's and external ear canals both ears Throat: lips, mucosa, and tongue normal; teeth and gums normal Neck: no adenopathy, no carotid bruit, supple, symmetrical, trachea midline and thyroid not enlarged, symmetric, no tenderness/mass/nodules Back: symmetric, no curvature. ROM normal. No CVA tenderness. Lungs: clear to auscultation bilaterally Heart: regular rate and rhythm, S1, S2 normal, no murmur,  click, rub or gallop Abdomen: soft, tender in lower quadrants bilaterally without guarding ,  bowel sounds normal; no masses,  no organomegaly Rectal: stool in vault,  Not impacted,  Stool brown. Exam non tender  Pulses: 2+ and symmetric Skin: Skin color, texture, turgor normal. No rashes or lesions Lymph nodes: Cervical, supraclavicular, and axillary nodes normal.  Lab Results  Component Value Date   HGBA1C 5.8 (H) 05/22/2016   HGBA1C 6.1 (H) 05/17/2015   HGBA1C 6.1  07/13/2012    Lab Results  Component Value Date   CREATININE 0.68 03/12/2017   CREATININE 0.70 05/22/2016   CREATININE 0.89 05/17/2015    Lab Results  Component Value Date   WBC 12.4 (H) 03/12/2017   HGB 11.3 (L) 03/12/2017   HCT 35.3 (L) 03/12/2017   PLT 341.0 03/12/2017   GLUCOSE 95 03/12/2017   CHOL 281 (H) 05/22/2016   TRIG 587 (HH) 05/22/2016   HDL 32 (L) 05/22/2016   LDLDIRECT 166.0 09/15/2014   LDLCALC Comment 05/22/2016   ALT 15 03/12/2017   AST 14 03/12/2017   NA 136 03/12/2017   K 4.1 03/12/2017   CL 102 03/12/2017   CREATININE 0.68 03/12/2017   BUN 13 03/12/2017   CO2 27 03/12/2017   TSH 4.820 (H) 05/22/2016   HGBA1C 5.8 (H) 05/22/2016    Mm Digital Screening Bilateral  Result Date: 07/07/2016 CLINICAL DATA:  Screening. EXAM: DIGITAL SCREENING BILATERAL MAMMOGRAM WITH CAD COMPARISON:  Previous exam(s). ACR Breast Density Category c: The breast tissue is heterogeneously dense, which may obscure small masses. FINDINGS: There are no findings suspicious for malignancy. Images were processed with CAD. IMPRESSION: No mammographic evidence of malignancy. A result letter of this screening mammogram will be mailed directly to the patient. RECOMMENDATION: Screening mammogram in one year. (Code:[id number]) BI-RADS CATEGORY  1: Negative. Electronically Signed   By: Frederico Hamman M.D.   On: 07/07/2016 15:00    Assessment & Plan:   Problem List Items Addressed This Visit    Abdominal pain, bilateral lower quadrant    History , exam , plain films and labs support diagnosis of diverticulitis .  No obstruction.  sending for CT abed and pelvis to confirm dx and rule out appendicitis.  cipro and flagyl prescribed. X 10 days       Essential hypertension    Elevated x 2 occasions.  Acute illness may be contributing.  Advised to check BP at home over the next 2 weeks and report readings for medication adjustment        Other Visit Diagnoses    Lower abdominal pain    -   Primary   Relevant Orders   Sedimentation rate (Completed)   CBC with Differential/Platelet (Completed)   Comprehensive metabolic panel (Completed)   POCT urinalysis dipstick (Completed)   DG Abd 1 View (Completed)   POCT urine pregnancy (Completed)   CT Abdomen Pelvis W Contrast      I have discontinued Ms. Tabet's Vitamin D (Ergocalciferol). I am also having her start on ciprofloxacin and metroNIDAZOLE. Additionally, I am having her maintain her multivitamin, levothyroxine, metoprolol succinate, and ALPRAZolam.  Meds ordered this encounter  Medications  . ciprofloxacin (CIPRO) 500 MG tablet    Sig: Take 1 tablet (500 mg total) by mouth 2 (two) times daily.    Dispense:  20 tablet    Refill:  0  . metroNIDAZOLE (FLAGYL) 500 MG tablet    Sig: Take 1 tablet (500 mg total) by mouth 3 (three) times daily.  Dispense:  30 tablet    Refill:  0    Medications Discontinued During This Encounter  Medication Reason  . Vitamin D, Ergocalciferol, (DRISDOL) 50000 units CAPS capsule Patient has not taken in last 30 days    Follow-up: No Follow-up on file.   Sherlene Shams, MD

## 2017-03-12 NOTE — Assessment & Plan Note (Signed)
Elevated x 2 occasions.  Acute illness may be contributing.  Advised to check BP at home over the next 2 weeks and report readings for medication adjustment

## 2017-03-12 NOTE — Assessment & Plan Note (Signed)
History , exam , plain films and labs support diagnosis of diverticulitis .  No obstruction.  sending for CT abed and pelvis to confirm dx and rule out appendicitis.  cipro and flagyl prescribed. X 10 days

## 2017-03-13 ENCOUNTER — Telehealth: Payer: Self-pay | Admitting: Internal Medicine

## 2017-03-13 ENCOUNTER — Encounter: Payer: Self-pay | Admitting: Internal Medicine

## 2017-03-13 ENCOUNTER — Ambulatory Visit
Admission: RE | Admit: 2017-03-13 | Discharge: 2017-03-13 | Disposition: A | Discharge status: Home / Self Care | Payer: BLUE CROSS/BLUE SHIELD | Source: Ambulatory Visit | Attending: Internal Medicine | Admitting: Internal Medicine

## 2017-03-13 DIAGNOSIS — R109 Unspecified abdominal pain: Secondary | ICD-10-CM | POA: Diagnosis not present

## 2017-03-13 DIAGNOSIS — I700 Atherosclerosis of aorta: Secondary | ICD-10-CM | POA: Diagnosis not present

## 2017-03-13 DIAGNOSIS — K5732 Diverticulitis of large intestine without perforation or abscess without bleeding: Secondary | ICD-10-CM | POA: Insufficient documentation

## 2017-03-13 DIAGNOSIS — N200 Calculus of kidney: Secondary | ICD-10-CM | POA: Diagnosis not present

## 2017-03-13 DIAGNOSIS — R1030 Lower abdominal pain, unspecified: Secondary | ICD-10-CM | POA: Insufficient documentation

## 2017-03-13 DIAGNOSIS — K760 Fatty (change of) liver, not elsewhere classified: Secondary | ICD-10-CM | POA: Insufficient documentation

## 2017-03-13 MED ORDER — IOPAMIDOL (ISOVUE-300) INJECTION 61%
100.0000 mL | Freq: Once | INTRAVENOUS | Status: AC | PRN
  Administered 2017-03-13: 100 mL via INTRAVENOUS

## 2017-03-13 NOTE — Telephone Encounter (Signed)
Pt called back returning your call. Please advise, thank you!  Call pt @ [phone]

## 2017-03-13 NOTE — Telephone Encounter (Signed)
See result note message 

## 2017-03-25 ENCOUNTER — Ambulatory Visit (INDEPENDENT_AMBULATORY_CARE_PROVIDER_SITE_OTHER): Payer: BLUE CROSS/BLUE SHIELD | Admitting: Internal Medicine

## 2017-03-25 ENCOUNTER — Encounter: Payer: Self-pay | Admitting: Internal Medicine

## 2017-03-25 DIAGNOSIS — Z23 Encounter for immunization: Secondary | ICD-10-CM

## 2017-03-25 DIAGNOSIS — K5732 Diverticulitis of large intestine without perforation or abscess without bleeding: Secondary | ICD-10-CM

## 2017-03-25 NOTE — Progress Notes (Signed)
Subjective:  Patient ID: Brittany Brooks, female    DOB: 28-Feb-1966  Age: 51 y.o. MRN: [id number]  CC: Diagnoses of Need for immunization against influenza and Diverticulitis of large intestine without perforation or abscess without bleeding were pertinent to this visit.  HPI Brittany Brooks presents for FOLLOW UP ON recent episode of DIVERTICULITIS .  Patient reports resolution of all symptoms .  Bowel movements have returned to normal,  And pain has resolved.  Results of CT scan reviewed with patient today.   Outpatient Medications Prior to Visit  Medication Sig Dispense Refill  . ALPRAZolam (XANAX) 0.25 MG tablet Take 1 tablet (0.25 mg total) by mouth at bedtime. 30 tablet 2  . ciprofloxacin (CIPRO) 500 MG tablet Take 1 tablet (500 mg total) by mouth 2 (two) times daily. 20 tablet 0  . levothyroxine (SYNTHROID, LEVOTHROID) 175 MCG tablet Take 175 mcg by mouth daily before breakfast.    . metoprolol succinate (TOPROL-XL) 50 MG 24 hr tablet Take 1/2 tablet (25 mg) by mouth once daily- you may take an extra 1/2 tablet (25 mg) by mouth daily as needed for palpitations 30 tablet 11  . metroNIDAZOLE (FLAGYL) 500 MG tablet Take 1 tablet (500 mg total) by mouth 3 (three) times daily. 30 tablet 0  . Multiple Vitamin (MULTIVITAMIN) tablet Take 1 tablet by mouth daily.     No facility-administered medications prior to visit.     Review of Systems;  Patient denies headache, fevers, malaise, unintentional weight loss, skin rash, eye pain, sinus congestion and sinus pain, sore throat, dysphagia,  hemoptysis , cough, dyspnea, wheezing, chest pain, palpitations, orthopnea, edema, abdominal pain, nausea, melena, diarrhea, constipation, flank pain, dysuria, hematuria, urinary  Frequency, nocturia, numbness, tingling, seizures,  Focal weakness, Loss of consciousness,  Tremor, insomnia, depression, anxiety, and suicidal ideation.      Objective:  BP 126/90 (BP Location: Right Arm, Patient Position:  Sitting, Cuff Size: Large)   Pulse 90   Temp 98.4 F (36.9 C) (Oral)   Resp 16   Ht 5\' 6"  (1.676 m)   Wt 242 lb 9.6 oz (110 kg)   SpO2 98%   BMI 39.16 kg/m   BP Readings from Last 3 Encounters:  03/25/17 126/90  03/12/17 (!) 146/100  05/08/16 (!) 170/104    Wt Readings from Last 3 Encounters:  03/25/17 242 lb 9.6 oz (110 kg)  03/12/17 241 lb 6.4 oz (109.5 kg)  05/22/16 246 lb (111.6 kg)    General appearance: alert, cooperative and appears stated age Ears: normal TM's and external ear canals both ears Throat: lips, mucosa, and tongue normal; teeth and gums normal Neck: no adenopathy, no carotid bruit, supple, symmetrical, trachea midline and thyroid not enlarged, symmetric, no tenderness/mass/nodules Back: symmetric, no curvature. ROM normal. No CVA tenderness. Lungs: clear to auscultation bilaterally Heart: regular rate and rhythm, S1, S2 normal, no murmur, click, rub or gallop Abdomen: soft, non-tender; bowel sounds normal; no masses,  no organomegaly Pulses: 2+ and symmetric Skin: Skin color, texture, turgor normal. No rashes or lesions Lymph nodes: Cervical, supraclavicular, and axillary nodes normal.  Lab Results  Component Value Date   HGBA1C 5.8 (H) 05/22/2016   HGBA1C 6.1 (H) 05/17/2015   HGBA1C 6.1 07/13/2012    Lab Results  Component Value Date   CREATININE 0.68 03/12/2017   CREATININE 0.70 05/22/2016   CREATININE 0.89 05/17/2015    Lab Results  Component Value Date   WBC 12.4 (H) 03/12/2017   HGB 11.3 (L)  03/12/2017   HCT 35.3 (L) 03/12/2017   PLT 341.0 03/12/2017   GLUCOSE 95 03/12/2017   CHOL 281 (H) 05/22/2016   TRIG 587 (HH) 05/22/2016   HDL 32 (L) 05/22/2016   LDLDIRECT 166.0 09/15/2014   LDLCALC Comment 05/22/2016   ALT 15 03/12/2017   AST 14 03/12/2017   NA 136 03/12/2017   K 4.1 03/12/2017   CL 102 03/12/2017   CREATININE 0.68 03/12/2017   BUN 13 03/12/2017   CO2 27 03/12/2017   TSH 4.820 (H) 05/22/2016   HGBA1C 5.8 (H)  05/22/2016    Ct Abdomen Pelvis W Contrast  Result Date: 03/13/2017 CLINICAL DATA:  Abdominal pain with diverticulitis suspected. These results will be called to the ordering clinician or representative by the Radiology Department at the imaging location. EXAM: CT ABDOMEN AND PELVIS WITH CONTRAST TECHNIQUE: Multidetector CT imaging of the abdomen and pelvis was performed using the standard protocol following bolus administration of intravenous contrast. CONTRAST:  100mL ISOVUE-300 IOPAMIDOL (ISOVUE-300) INJECTION 61% COMPARISON:  None. FINDINGS: Lower chest:  No contributory findings. Hepatobiliary: Hepatic steatosis. No evidence of hepatic mass.Cholecystectomy. Normal common bile duct diameter. Small densities near the lower common bile duct are extraluminal. Pancreas: Unremarkable. Spleen: Unremarkable. Adrenals/Urinary Tract: Negative adrenals. No hydronephrosis or ureteral stone. 3 left renal calculi up to 2mm. Unremarkable bladder. Stomach/Bowel: Wall thickening and pericolonic edema of the sigmoid colon contered on a thickened diverticulum. No abscess or pneumoperitoneum. Diverticulosis is mainly limited to the sigmoid. No appendicitis. Vascular/Lymphatic: No acute vascular abnormality. No mass or adenopathy. Reproductive:No pathologic findings. Other: No ascites or pneumoperitoneum. Musculoskeletal: No acute abnormalities. Sacroiliac osteoarthritis. Spondylosis. Facet arthropathy with asymmetric degeneration on the right at L5-S1. IMPRESSION: 1. Sigmoid diverticulitis without abscess or pneumoperitoneum. 2. 3 small left renal calculi. 3. Hepatic steatosis. 4.   Aortic Atherosclerosis (ICD10-I70.0). Electronically Signed   By: Marnee SpringJonathon  Watts M.D.   On: 03/13/2017 09:44    Assessment & Plan:   Problem List Items Addressed This Visit    Diverticulitis large intestine    Confirmed by CT.  Clinically resolved. Results of CT scan reviewed with patient,  Low vshigh residue diet discussed.           Other Visit Diagnoses    Need for immunization against influenza       Relevant Orders   Flu Vaccine QUAD 36+ mos IM (Completed)      I am having Ms. Jakes maintain her multivitamin, levothyroxine, metoprolol succinate, ALPRAZolam, ciprofloxacin, and metroNIDAZOLE.  No orders of the defined types were placed in this encounter.   There are no discontinued medications.  Follow-up: No Follow-up on file.   Sherlene ShamsULLO, Marylen Zuk L, MD

## 2017-03-28 NOTE — Assessment & Plan Note (Addendum)
Confirmed by CT.  Clinically resolved. Results of CT scan reviewed with patient,  Low vshigh residue diet discussed.

## 2017-03-31 DIAGNOSIS — R7889 Finding of other specified substances, not normally found in blood: Secondary | ICD-10-CM | POA: Diagnosis not present

## 2017-03-31 DIAGNOSIS — E781 Pure hyperglyceridemia: Secondary | ICD-10-CM | POA: Diagnosis not present

## 2017-03-31 DIAGNOSIS — N951 Menopausal and female climacteric states: Secondary | ICD-10-CM | POA: Diagnosis not present

## 2017-03-31 DIAGNOSIS — E039 Hypothyroidism, unspecified: Secondary | ICD-10-CM | POA: Diagnosis not present

## 2017-04-02 DIAGNOSIS — R7889 Finding of other specified substances, not normally found in blood: Secondary | ICD-10-CM | POA: Diagnosis not present

## 2017-04-02 DIAGNOSIS — E032 Hypothyroidism due to medicaments and other exogenous substances: Secondary | ICD-10-CM | POA: Diagnosis not present

## 2017-04-02 DIAGNOSIS — I10 Essential (primary) hypertension: Secondary | ICD-10-CM | POA: Diagnosis not present

## 2017-04-02 DIAGNOSIS — F410 Panic disorder [episodic paroxysmal anxiety] without agoraphobia: Secondary | ICD-10-CM | POA: Diagnosis not present

## 2017-04-14 ENCOUNTER — Other Ambulatory Visit: Payer: Self-pay | Admitting: Obstetrics and Gynecology

## 2017-04-14 ENCOUNTER — Other Ambulatory Visit: Payer: Self-pay | Admitting: Internal Medicine

## 2017-04-16 ENCOUNTER — Ambulatory Visit: Payer: Self-pay | Admitting: Internal Medicine

## 2017-05-11 DIAGNOSIS — E039 Hypothyroidism, unspecified: Secondary | ICD-10-CM | POA: Diagnosis not present

## 2017-05-28 ENCOUNTER — Encounter: Payer: BLUE CROSS/BLUE SHIELD | Admitting: Obstetrics and Gynecology

## 2017-07-23 ENCOUNTER — Telehealth: Payer: Self-pay | Admitting: Internal Medicine

## 2017-07-23 DIAGNOSIS — J40 Bronchitis, not specified as acute or chronic: Secondary | ICD-10-CM | POA: Diagnosis not present

## 2017-07-23 NOTE — Telephone Encounter (Addendum)
°*  STAT* If patient is at the pharmacy, call can be transferred to refill team.   1. Which medications need to be refilled? (please list name of each medication and dose if known)  Metoprolol 25 mg po q day and  Additional 25 mg po q day PRN 2. Which pharmacy/location (including street and city if local pharmacy) is medication to be sent to? [facility] [address]   3. Do they need a 30 day or 90 day supply? 90    PATIENT IS OUT OF MEDICINE

## 2017-07-23 NOTE — Telephone Encounter (Signed)
Pt has not been seen since 2017 pt is overdue for appointment.

## 2017-07-24 ENCOUNTER — Ambulatory Visit: Payer: BLUE CROSS/BLUE SHIELD | Admitting: Family Medicine

## 2017-07-24 MED ORDER — METOPROLOL SUCCINATE ER 50 MG PO TB24: ORAL_TABLET | ORAL | 0 refills | Status: DC

## 2017-07-24 NOTE — Telephone Encounter (Signed)
Pt is scheduled 08/06/17   Please send in just enough to last until appointment

## 2017-08-06 ENCOUNTER — Ambulatory Visit: Payer: BLUE CROSS/BLUE SHIELD | Admitting: Internal Medicine

## 2017-08-06 ENCOUNTER — Encounter: Payer: Self-pay | Admitting: Internal Medicine

## 2017-08-06 VITALS — BP 138/90 | HR 85 | Ht 66.0 in | Wt 244.5 lb

## 2017-08-06 DIAGNOSIS — R002 Palpitations: Secondary | ICD-10-CM

## 2017-08-06 DIAGNOSIS — E781 Pure hyperglyceridemia: Secondary | ICD-10-CM

## 2017-08-06 MED ORDER — METOPROLOL SUCCINATE ER 50 MG PO TB24: ORAL_TABLET | ORAL | 6 refills | Status: DC

## 2017-08-06 NOTE — Patient Instructions (Signed)
Medication Instructions: - Your physician recommends that you continue on your current medications as directed. Please refer to the Current Medication list given to you today.   Your metoprolol refill has been sent to the pharmacy today  Labwork: - Your physician recommends that you have lab work today: lipid panel  Procedures/Testing: - none ordred  Follow-Up: - Your physician wants you to follow-up in: 1 year with Dr. Graciela HusbandsKlein. You will receive a reminder letter in the mail two months in advance. If you don't receive a letter, please call our office to schedule the follow-up appointment.   Any Additional Special Instructions Will Be Listed Below (If Applicable).     If you need a refill on your cardiac medications before your next appointment, please call your pharmacy.

## 2017-08-06 NOTE — Progress Notes (Signed)
Patient Care Team: Sherlene Shams, MD as PCP - General (Internal Medicine)   HPI  Brittany Brooks is a 52 y.o. female Seen in follow-up for nocturnal palpitations. She has had heart rates documented in the 130-50 range; P waves were similar to sinus but abrupt onset suggest the possibility of atrial tachycardia.   Myoview 2017 demonstrated normal LV function with no ischemia. Echocardiogram normal LV function trivial regurgitation mild left ventricular hypertrophy and a Holter monitor was obtained demonstrating 21 PVCs 100 PACs one 6 beat run of SVT. She also had nocturnal oximetry --borderline  Catheterization [facility]) was normal  Urine studies for adrenaline were normal  She has been better with her CPAP although she continues to have problems with palpitations.  She remains much improved on metoprolol.  Hormonal variations and intercurrent illness have been associated with increasing palpitations.  She is struggling with her weight.  She is about to embark on a diet plan with accompanying exercise.  She is also worried about her lipid status.  These were reviewed  Date TC LDL TG TSH  4/16 272  166 161   12/17     587 (nonfasting) 4.82   She has moderate shortness of breath but no chest pain.    DATE TEST EF   12 /14 Echo   65 %   3/17 Cath 65   % Normal CA         Past Medical History:  Diagnosis Date  . Dysrhythmia   . Hyperlipidemia   . Hypertension   . Hypothyroidism    since early 20's, secondary to Hashimoto's  . Multinodular goiter (nontoxic)    biopsy negativre by [facility]  . Pancreatitis 2000   secondary to cholangiogram during lap chole, [facility]  . Sleep apnea     Past Surgical History:  Procedure Laterality Date  . CARDIAC CATHETERIZATION N/A 08/08/2015   Procedure: Left Heart Cath and Coronary Angiography;  Surgeon: Dalia Heading, MD;  Location: [facility] INVASIVE CV LAB;  Service: Cardiovascular;  Laterality: N/A;  . CESAREAN SECTION  Sept 2000    . CHOLECYSTECTOMY  2000   recurrent cholecystitis during pregnancy    Current Outpatient Medications  Medication Sig Dispense Refill  . ALPRAZolam (XANAX) 0.25 MG tablet TAKE ONE TABLET AT BEDTIME (Patient taking differently: TAKE ONE TABLET AT BEDTIME AS NEEDED.) 30 tablet 2  . levothyroxine (SYNTHROID, LEVOTHROID) 200 MCG tablet Take 200 mcg by mouth daily before breakfast.    . metoprolol succinate (TOPROL-XL) 50 MG 24 hr tablet Take 1/2 tablet (25 mg) by mouth once daily- you may take an extra 1/2 tablet (25 mg) by mouth daily as needed for palpitations 30 tablet 6  . Multiple Vitamin (MULTIVITAMIN) tablet Take 1 tablet by mouth daily.     No current facility-administered medications for this visit.     Allergies  Allergen Reactions  . Lexapro [Escitalopram Oxalate] Other (See Comments)    Tachycardia       Review of Systems negative except from HPI and PMH  Physical Exam BP 138/90 (BP Location: Left Arm, Patient Position: Sitting, Cuff Size: Large)   Pulse 85   Ht 5\' 6"  (1.676 m)   Wt 244 lb 8 oz (110.9 kg)   BMI 39.46 kg/m   Well developed and nourished in no acute distress HENT normal Neck supple with JVP-flat Carotids brisk and full without bruits Clear Regular rate and rhythm, no murmurs or gallops Abd-soft with active BS without  hepatomegaly No Clubbing cyanosis edema Skin-warm and dry A & Oriented  Grossly normal sensory and motor function  ECG demonstrates sinus rhythm at 84 Intervals 15/06/38 Otherwise normal  Assessment and  Plan  Nocturnal palpitations   Sleep apnea   Hypertension   Hyperlipidemia  Treated hypothyroidism  Her palpitations are much improved on metoprolol.  We will continue it.  Blood pressure borderline elevated; she is going to be working on weight loss.  I think prior to that probably does not make sense to increase her medications.  Lipid status is a problem.  She has significantly elevated LDL and has had severe  hypertriglyceridemia in the past.  We will check fasting status today and then discuss with her PCP risk stratification is necessary with calcium scoring and/or therapy  In this regard her TSH needs to be reassessed  More than 50% of 40 min was spent in counseling related to the above

## 2017-08-07 ENCOUNTER — Other Ambulatory Visit: Payer: Self-pay | Admitting: Internal Medicine

## 2017-08-07 DIAGNOSIS — Z1231 Encounter for screening mammogram for malignant neoplasm of breast: Secondary | ICD-10-CM

## 2017-08-07 LAB — LIPID PANEL
CHOLESTEROL TOTAL: 265 mg/dL — AB (ref 100–199)
Chol/HDL Ratio: 7.2 ratio — ABNORMAL HIGH (ref 0.0–4.4)
HDL: 37 mg/dL — ABNORMAL LOW (ref >39)
LDL CALC: 160 mg/dL — AB (ref 0–99)
TRIGLYCERIDES: 341 mg/dL — AB (ref 0–149)
VLDL Cholesterol Cal: 68 mg/dL — ABNORMAL HIGH (ref 5–40)

## 2017-08-10 ENCOUNTER — Encounter: Payer: Self-pay | Admitting: Internal Medicine

## 2017-08-13 ENCOUNTER — Encounter: Payer: BLUE CROSS/BLUE SHIELD | Admitting: Obstetrics and Gynecology

## 2017-08-21 DIAGNOSIS — I10 Essential (primary) hypertension: Secondary | ICD-10-CM | POA: Diagnosis not present

## 2017-08-21 DIAGNOSIS — R739 Hyperglycemia, unspecified: Secondary | ICD-10-CM | POA: Diagnosis not present

## 2017-08-21 DIAGNOSIS — E785 Hyperlipidemia, unspecified: Secondary | ICD-10-CM | POA: Diagnosis not present

## 2017-08-21 DIAGNOSIS — E039 Hypothyroidism, unspecified: Secondary | ICD-10-CM | POA: Diagnosis not present

## 2017-08-21 DIAGNOSIS — R5383 Other fatigue: Secondary | ICD-10-CM | POA: Diagnosis not present

## 2017-08-27 ENCOUNTER — Encounter: Payer: BLUE CROSS/BLUE SHIELD | Admitting: Obstetrics and Gynecology

## 2017-09-01 ENCOUNTER — Encounter: Payer: Self-pay | Admitting: Internal Medicine

## 2017-09-02 ENCOUNTER — Ambulatory Visit: Payer: Self-pay | Admitting: *Deleted

## 2017-09-02 NOTE — Telephone Encounter (Signed)
Patient is a DR Darrick Huntsmanullo pt.  She will be seeing Dr. French Anaracy for possible Diverticulitis flare up. FYI

## 2017-09-02 NOTE — Telephone Encounter (Signed)
Patient is calling with abdominal pain very similar to the pain that she had last year when she had diverticulitis. She states she did have a GI virus last week and she had some constipation over the week end. [facility] or [facility] and patient declines- she does not trust UC and [place]. She wants to be seen at her PCP- appointment scheduled- patient instructed that if she develops fever or her pain increases she is to go to ED/UC- she states she will. Reason for Disposition . [1] MILD-MODERATE pain AND [2] constant AND [3] present > 2 hours  Answer Assessment - Initial Assessment Questions 1. LOCATION: "Where does it hurt?"      Lower abdominal- midline ( slightly to left 2. RADIATION: "Does the pain shoot anywhere else?" (e.g., chest, back)     no 3. ONSET: "When did the pain begin?" (e.g., minutes, hours or days ago)      Monday late evening 4. SUDDEN: "Gradual or sudden onset?"     sudden 5. PATTERN "Does the pain come and go, or is it constant?"    - If constant: "Is it getting better, staying the same, or worsening?"      (Note: Constant means the pain never goes away completely; most serious pain is constant and it progresses)     - If intermittent: "How long does it last?" "Do you have pain now?"     (Note: Intermittent means the pain goes away completely between bouts)     constant 6. SEVERITY: "How bad is the pain?"  (e.g., Scale 1-10; mild, moderate, or severe)   - MILD (1-3): doesn't interfere with normal activities, abdomen soft and not tender to touch    - MODERATE (4-7): interferes with normal activities or awakens from sleep, tender to touch    - SEVERE (8-10): excruciating pain, doubled over, unable to do any normal activities      8- not doubled over- but hurts to cough 7. RECURRENT SYMPTOM: "Have you ever had this type of abdominal pain before?" If so, ask: "When was the last time?" and "What  happened that time?"      Yes- patient thinks it is very similar to diverticulitis, treated with antibiotics 8. CAUSE: "What do you think is causing the abdominal pain?"     possible flare of diverticulitis 9. RELIEVING/AGGRAVATING FACTORS: "What makes it better or worse?" (e.g., movement, antacids, bowel movement)     No relieving factor 10. OTHER SYMPTOMS: "Has there been any vomiting, diarrhea, constipation, or urine problems?"       Constipation for 2 days- S/M/T night small BM 11. PREGNANCY: "Is there any chance you are pregnant?" "When was your last menstrual period?"       LMP- 07/25/17 abstaining/condom  Protocols used: A[facility]OMINAL PAIN - [id number]

## 2017-09-03 ENCOUNTER — Ambulatory Visit: Payer: BLUE CROSS/BLUE SHIELD | Admitting: Internal Medicine

## 2017-09-03 ENCOUNTER — Encounter: Payer: Self-pay | Admitting: Internal Medicine

## 2017-09-03 VITALS — BP 148/94 | HR 75 | Temp 98.1°F | Ht 66.0 in | Wt 244.2 lb

## 2017-09-03 DIAGNOSIS — K5792 Diverticulitis of intestine, part unspecified, without perforation or abscess without bleeding: Secondary | ICD-10-CM

## 2017-09-03 DIAGNOSIS — R194 Change in bowel habit: Secondary | ICD-10-CM | POA: Diagnosis not present

## 2017-09-03 DIAGNOSIS — R1032 Left lower quadrant pain: Secondary | ICD-10-CM

## 2017-09-03 LAB — CBC WITH DIFFERENTIAL/PLATELET
BASOS ABS: 0.1 10*3/uL (ref 0.0–0.1)
Basophils Relative: 0.6 % (ref 0.0–3.0)
EOS PCT: 2.9 % (ref 0.0–5.0)
Eosinophils Absolute: 0.3 10*3/uL (ref 0.0–0.7)
HEMATOCRIT: 34.2 % — AB (ref 36.0–46.0)
Hemoglobin: 11.2 g/dL — ABNORMAL LOW (ref 12.0–15.0)
LYMPHS ABS: 1.9 10*3/uL (ref 0.7–4.0)
LYMPHS PCT: 18.1 % (ref 12.0–46.0)
MCHC: 32.9 g/dL (ref 30.0–36.0)
MCV: 77.9 fl — AB (ref 78.0–100.0)
MONOS PCT: 7.8 % (ref 3.0–12.0)
Monocytes Absolute: 0.8 10*3/uL (ref 0.1–1.0)
NEUTROS ABS: 7.5 10*3/uL (ref 1.4–7.7)
Neutrophils Relative %: 70.6 % (ref 43.0–77.0)
PLATELETS: 330 10*3/uL (ref 150.0–400.0)
RBC: 4.39 Mil/uL (ref 3.87–5.11)
RDW: 14.9 % (ref 11.5–15.5)
WBC: 10.6 10*3/uL — ABNORMAL HIGH (ref 4.0–10.5)

## 2017-09-03 LAB — URINALYSIS, ROUTINE W REFLEX MICROSCOPIC
BILIRUBIN URINE: NEGATIVE
Hgb urine dipstick: NEGATIVE
LEUKOCYTES UA: NEGATIVE
Nitrite: NEGATIVE
PH: 6.5 (ref 5.0–8.0)
RBC / HPF: NONE SEEN (ref 0–?)
SPECIFIC GRAVITY, URINE: 1.015 (ref 1.000–1.030)
TOTAL PROTEIN, URINE-UPE24: NEGATIVE
Urine Glucose: NEGATIVE
Urobilinogen, UA: 0.2 (ref 0.0–1.0)
WBC, UA: NONE SEEN (ref 0–?)

## 2017-09-03 LAB — COMPREHENSIVE METABOLIC PANEL
ALT: 24 U/L (ref 0–35)
AST: 20 U/L (ref 0–37)
Albumin: 3.8 g/dL (ref 3.5–5.2)
Alkaline Phosphatase: 51 U/L (ref 39–117)
BILIRUBIN TOTAL: 0.3 mg/dL (ref 0.2–1.2)
BUN: 14 mg/dL (ref 6–23)
CO2: 28 mEq/L (ref 19–32)
CREATININE: 0.6 mg/dL (ref 0.40–1.20)
Calcium: 9.2 mg/dL (ref 8.4–10.5)
Chloride: 102 mEq/L (ref 96–112)
GFR: 112.78 mL/min (ref >60.00)
GLUCOSE: 97 mg/dL (ref 70–99)
Potassium: 4.3 mEq/L (ref 3.5–5.1)
Sodium: 137 mEq/L (ref 135–145)
TOTAL PROTEIN: 7.5 g/dL (ref 6.0–8.3)

## 2017-09-03 MED ORDER — CIPROFLOXACIN HCL 500 MG PO TABS: 500.0000 mg | ORAL_TABLET | Freq: Two times a day (BID) | ORAL | 0 refills | Status: DC

## 2017-09-03 MED ORDER — METRONIDAZOLE 500 MG PO TABS: 500.0000 mg | ORAL_TABLET | Freq: Two times a day (BID) | ORAL | 0 refills | Status: DC

## 2017-09-03 NOTE — Patient Instructions (Signed)
[facility] of [facility]  Take care  Follow up with Dr. Darrick Huntsmanullo if needed   Diverticulitis Diverticulitis is infection or inflammation of small pouches (diverticula) in the colon that form due to a condition called diverticulosis. Diverticula can trap stool (feces) and bacteria, causing infection and inflammation. Diverticulitis may cause severe stomach pain and diarrhea. It may lead to tissue damage in the colon that causes bleeding. The diverticula may also burst (rupture) and cause infected stool to enter other areas of the abdomen. Complications of diverticulitis can include:  Bleeding.  Severe infection.  Severe pain.  Rupture (perforation) of the colon.  Blockage (obstruction) of the colon.  What are the causes? This condition is caused by stool becoming trapped in the diverticula, which allows bacteria to grow in the diverticula. This leads to inflammation and infection. What increases the risk? You are more likely to develop this condition if:  You have diverticulosis. The risk for diverticulosis increases if: ? You are overweight or obese. ? You use tobacco products. ? You do not get enough exercise.  You eat a diet that does not include enough fiber. High-fiber foods include fruits, vegetables, beans, nuts, and whole grains.  What are the signs or symptoms? Symptoms of this condition may include:  Pain and tenderness in the abdomen. The pain is normally located on the left side of the abdomen, but it may occur in other areas.  Fever and chills.  Bloating.  Cramping.  Nausea.  Vomiting.  Changes in bowel routines.  Blood in your stool.  How is this diagnosed? This condition is diagnosed based on:  Your medical history.  A physical exam.  Tests to make sure there is nothing else causing your condition. These tests may include: ? Blood tests. ? Urine tests. ? Imaging tests of the abdomen, including X-rays,  ultrasounds, MRIs, or CT scans.  How is this treated? Most cases of this condition are mild and can be treated at home. Treatment may include:  Taking over-the-counter pain medicines.  Following a clear liquid diet.  Taking antibiotic medicines by mouth.  Rest.  More severe cases may need to be treated at a hospital. Treatment may include:  Not eating or drinking.  Taking prescription pain medicine.  Receiving antibiotic medicines through an IV tube.  Receiving fluids and nutrition through an IV tube.  Surgery.  When your condition is under control, your health care provider may recommend that you have a colonoscopy. This is an exam to look at the entire large intestine. During the exam, a lubricated, bendable tube is inserted into the anus and then passed into the rectum, colon, and other parts of the large intestine. A colonoscopy can show how severe your diverticula are and whether something else may be causing your symptoms. Follow these instructions at home: Medicines  Take over-the-counter and prescription medicines only as told by your health care provider. These include fiber supplements, probiotics, and stool softeners.  If you were prescribed an antibiotic medicine, take it as told by your health care provider. Do not stop taking the antibiotic even if you start to feel better.  Do not drive or use heavy machinery while taking prescription pain medicine. General instructions  Follow a full liquid diet or another diet as directed by your health care provider. After your symptoms improve, your health care provider may tell you to change your diet. He or she may recommend that you eat a diet  that contains at least 25 g (25 grams) of fiber daily. Fiber makes it easier to pass stool. Healthy sources of fiber include: ? Berries. One cup contains 4-8 grams of fiber. ? Beans or lentils. One half cup contains 5-8 grams of fiber. ? Green vegetables. One cup contains 4 grams of  fiber.  Exercise for at least 30 minutes, 3 times each week. You should exercise hard enough to raise your heart rate and break a sweat.  Keep all follow-up visits as told by your health care provider. This is important. You may need a colonoscopy. Contact a health care provider if:  Your pain does not improve.  You have a hard time drinking or eating food.  Your bowel movements do not return to normal. Get help right away if:  Your pain gets worse.  Your symptoms do not get better with treatment.  Your symptoms suddenly get worse.  You have a fever.  You vomit more than one time.  You have stools that are bloody, black, or tarry. Summary  Diverticulitis is infection or inflammation of small pouches (diverticula) in the colon that form due to a condition called diverticulosis. Diverticula can trap stool (feces) and bacteria, causing infection and inflammation.  You are at higher risk for this condition if you have diverticulosis and you eat a diet that does not include enough fiber.  Most cases of this condition are mild and can be treated at home. More severe cases may need to be treated at a hospital.  When your condition is under control, your health care provider may recommend that you have an exam called a colonoscopy. This exam can show how severe your diverticula are and whether something else may be causing your symptoms. This information is not intended to replace advice given to you by your health care provider. Make sure you discuss any questions you have with your health care provider. Document Released: 03/05/2005 Document Revised: 06/28/2016 Document Reviewed: 06/28/2016 Elsevier Interactive Patient Education  [employer].

## 2017-09-03 NOTE — Progress Notes (Signed)
Pre visit review using our clinic review tool, if applicable. No additional management support is needed unless otherwise documented below in the visit note. 

## 2017-09-04 ENCOUNTER — Telehealth: Payer: Self-pay

## 2017-09-04 ENCOUNTER — Encounter: Payer: BLUE CROSS/BLUE SHIELD | Admitting: Obstetrics and Gynecology

## 2017-09-04 NOTE — Telephone Encounter (Signed)
Provider has yet to interpret the result.

## 2017-09-04 NOTE — Telephone Encounter (Signed)
Copied from CRM [id number]. Topic: General - Other >> Sep 04, 2017 10:26 AM Gerrianne ScalePayne, Angela L wrote: Reason for CRM: pt calling about lab results

## 2017-09-06 ENCOUNTER — Encounter: Payer: Self-pay | Admitting: Internal Medicine

## 2017-09-06 DIAGNOSIS — K5792 Diverticulitis of intestine, part unspecified, without perforation or abscess without bleeding: Secondary | ICD-10-CM | POA: Insufficient documentation

## 2017-09-06 NOTE — Progress Notes (Signed)
Chief Complaint  Patient presents with  . Abdominal Pain   Acute visit  Tuesday she had LLQ abdominal pain but reports she was just getting over GI virus with n/v/d last week. Saturday last week she had loose stools and Sunday no stools, Monday, Tuesday and Weds she has LLQ ab pain similar to Oct 2018 when dx'ed diverticulitis via CT scan. Pain was 8/10 she tried Tylenol x 2 pills which helped and a heating pad. Today pain is 1/10 feels like pressure. She is no longer having diarrhea/n/v, denies fever.  She also reports bowel habits have changed and asks if ok to consume fiber I.e metamucil.   Of note per pt LMP 07/25/17   Review of Systems  Constitutional: Negative for weight loss.  Respiratory: Negative for shortness of breath.   Cardiovascular: Negative for chest pain.  Gastrointestinal: Positive for abdominal pain. Negative for blood in stool, diarrhea, nausea and vomiting.   Past Medical History:  Diagnosis Date  . Diverticulitis   . Dysrhythmia   . Hyperlipidemia   . Hypertension   . Hypothyroidism    since early 20's, secondary to Hashimoto's  . Multinodular goiter (nontoxic)    biopsy negativre by [facility]  . Pancreatitis 2000   secondary to cholangiogram during lap chole, Sankar  . Sleep apnea    Past Surgical History:  Procedure Laterality Date  . CARDIAC CATHETERIZATION N/A 08/08/2015   Procedure: Left Heart Cath and Coronary Angiography;  Surgeon: Dalia Heading, MD;  Location: [facility] INVASIVE CV LAB;  Service: Cardiovascular;  Laterality: N/A;  . CESAREAN SECTION  Sept 2000  . CHOLECYSTECTOMY  2000   recurrent cholecystitis during pregnancy   Family History  Problem Relation Age of Onset  . Hyperlipidemia Mother   . Hypertension Mother   . COPD Mother   . Hyperlipidemia Father   . Heart disease Father 20       CABG x 4  . Diabetes Father   . Stroke Father        TIA  . COPD Father   . Cancer Neg Hx    Social History   Socioeconomic History  . Marital  status: Married    Spouse name: Not on file  . Number of children: Not on file  . Years of education: Not on file  . Highest education level: Not on file  Occupational History  . Not on file  Social Needs  . Financial resource strain: Not on file  . Food insecurity:    Worry: Not on file    Inability: Not on file  . Transportation needs:    Medical: Not on file    Non-medical: Not on file  Tobacco Use  . Smoking status: Never Smoker  . Smokeless tobacco: Never Used  Substance and Sexual Activity  . Alcohol use: Yes    Alcohol/week: 2.4 oz    Types: 4 Glasses of wine per week    Comment: occassional  . Drug use: No  . Sexual activity: Not Currently  Lifestyle  . Physical activity:    Days per week: Not on file    Minutes per session: Not on file  . Stress: Not on file  Relationships  . Social connections:    Talks on phone: Not on file    Gets together: Not on file    Attends religious service: Not on file    Active member of club or organization: Not on file    Attends meetings of clubs or organizations: Not  on file    Relationship status: Not on file  . Intimate partner violence:    Fear of current or ex partner: Not on file    Emotionally abused: Not on file    Physically abused: Not on file    Forced sexual activity: Not on file  Other Topics Concern  . Not on file  Social History Narrative  . Not on file   No outpatient medications have been marked as taking for the 09/03/17 encounter (Office Visit) with McLean-Scocuzza, Pasty Spillers, MD.   Allergies  Allergen Reactions  . Lexapro [Escitalopram Oxalate] Other (See Comments)    Tachycardia    Recent Results (from the past 2160 hour(s))  Lipid Profile     Status: Abnormal   Collection Time: 08/06/17 10:26 AM  Result Value Ref Range   Cholesterol, Total 265 (H) 100 - 199 mg/dL   Triglycerides 161 (H) 0 - 149 mg/dL   HDL 37 (L) >09 mg/dL   VLDL Cholesterol Cal 68 (H) 5 - 40 mg/dL   LDL Calculated 604 (H) 0 -  99 mg/dL   Chol/HDL Ratio 7.2 (H) 0.0 - 4.4 ratio    Comment:                                   T. Chol/HDL Ratio                                             Men  Women                               1/2 Avg.Risk  3.4    3.3                                   Avg.Risk  5.0    4.4                                2X Avg.Risk  9.6    7.1                                3X Avg.Risk 23.4   11.0   Comprehensive metabolic panel     Status: None   Collection Time: 09/03/17 12:10 PM  Result Value Ref Range   Sodium 137 135 - 145 mEq/L   Potassium 4.3 3.5 - 5.1 mEq/L   Chloride 102 96 - 112 mEq/L   CO2 28 19 - 32 mEq/L   Glucose, Bld 97 70 - 99 mg/dL   BUN 14 6 - 23 mg/dL   Creatinine, Ser 5.40 0.40 - 1.20 mg/dL   Total Bilirubin 0.3 0.2 - 1.2 mg/dL   Alkaline Phosphatase 51 39 - 117 U/L   AST 20 0 - 37 U/L   ALT 24 0 - 35 U/L   Total Protein 7.5 6.0 - 8.3 g/dL   Albumin 3.8 3.5 - 5.2 g/dL   Calcium 9.2 8.4 - 98.1 mg/dL   GFR 191.47 >82.95 mL/min  Urinalysis, Routine w reflex microscopic     Status: Abnormal  Collection Time: 09/03/17 12:10 PM  Result Value Ref Range   Color, Urine YELLOW Yellow;Lt. Yellow   APPearance CLEAR Clear   Specific Gravity, Urine 1.015 1.000 - 1.030   pH 6.5 5.0 - 8.0   Total Protein, Urine NEGATIVE Negative   Urine Glucose NEGATIVE Negative   Ketones, ur TRACE (A) Negative   Bilirubin Urine NEGATIVE Negative   Hgb urine dipstick NEGATIVE Negative   Urobilinogen, UA 0.2 0.0 - 1.0   Leukocytes, UA NEGATIVE Negative   Nitrite NEGATIVE Negative   WBC, UA none seen 0-2/hpf   RBC / HPF none seen 0-2/hpf   Squamous Epithelial / LPF Rare(0-4/hpf) Rare(0-4/hpf)  CBC with Differential/Platelet     Status: Abnormal   Collection Time: 09/03/17 12:10 PM  Result Value Ref Range   WBC 10.6 (H) 4.0 - 10.5 K/uL   RBC 4.39 3.87 - 5.11 Mil/uL   Hemoglobin 11.2 (L) 12.0 - 15.0 g/dL   HCT 16.134.2 (L) 09.636.0 - 04.546.0 %   MCV 77.9 (L) 78.0 - 100.0 fl   MCHC 32.9 30.0 - 36.0 g/dL    RDW 40.914.9 81.111.5 - 91.415.5 %   Platelets 330.0 150.0 - 400.0 K/uL   Neutrophils Relative % 70.6 43.0 - 77.0 %   Lymphocytes Relative 18.1 12.0 - 46.0 %   Monocytes Relative 7.8 3.0 - 12.0 %   Eosinophils Relative 2.9 0.0 - 5.0 %   Basophils Relative 0.6 0.0 - 3.0 %   Neutro Abs 7.5 1.4 - 7.7 K/uL   Lymphs Abs 1.9 0.7 - 4.0 K/uL   Monocytes Absolute 0.8 0.1 - 1.0 K/uL   Eosinophils Absolute 0.3 0.0 - 0.7 K/uL   Basophils Absolute 0.1 0.0 - 0.1 K/uL   Objective  Body mass index is 39.41 kg/m. Wt Readings from Last 3 Encounters:  09/03/17 244 lb 3.2 oz (110.8 kg)  08/06/17 244 lb 8 oz (110.9 kg)  03/25/17 242 lb 9.6 oz (110 kg)   Temp Readings from Last 3 Encounters:  09/03/17 98.1 F (36.7 C) (Oral)  03/25/17 98.4 F (36.9 C) (Oral)  03/12/17 98.2 F (36.8 C) (Oral)   BP Readings from Last 3 Encounters:  09/03/17 (!) 148/94  08/06/17 138/90  03/25/17 126/90   Pulse Readings from Last 3 Encounters:  09/03/17 75  08/06/17 85  03/25/17 90    Physical Exam  Constitutional: She is oriented to person, place, and time and well-developed, well-nourished, and in no distress.  HENT:  Head: Normocephalic and atraumatic.  Mouth/Throat: Oropharynx is clear and moist and mucous membranes are normal.  Eyes: Pupils are equal, round, and reactive to light. Conjunctivae are normal.  Cardiovascular: Normal rate, regular rhythm and normal heart sounds.  Pulmonary/Chest: Effort normal and breath sounds normal.  Abdominal: Soft. Bowel sounds are normal. There is no tenderness.  No LLQ ab pain on exam today  Neurological: She is alert and oriented to person, place, and time. Gait normal. Gait normal.  Skin: Skin is warm, dry and intact.  Psychiatric: Mood, memory, affect and judgment normal.  Nursing note and vitals reviewed.   Assessment   1. LLQ ab pain with h/o diverticulitis 03/2017 CT + and change in bowel habits  Plan  1.  Check CMET, CBC, UA today  Will print Cipro/flagyl if  pt ab pain returns as was earlier this week she will get Rx filled  Refer to GI Dr. Servando SnareWohl  Hold on fiber I.e metamucil for now with acute flare of abdominal pain but in  future likely needs to consume increased fiber intake Provider: Dr. French Ana McLean-Scocuzza-[facility]

## 2017-10-09 ENCOUNTER — Ambulatory Visit
Admission: RE | Admit: 2017-10-09 | Discharge: 2017-10-09 | Disposition: A | Discharge status: Home / Self Care | Payer: BLUE CROSS/BLUE SHIELD | Source: Ambulatory Visit | Attending: Internal Medicine | Admitting: Internal Medicine

## 2017-10-09 DIAGNOSIS — Z1231 Encounter for screening mammogram for malignant neoplasm of breast: Secondary | ICD-10-CM | POA: Diagnosis not present

## 2017-10-19 ENCOUNTER — Other Ambulatory Visit: Payer: Self-pay

## 2017-10-19 ENCOUNTER — Ambulatory Visit: Payer: BLUE CROSS/BLUE SHIELD | Admitting: Gastroenterology

## 2017-10-19 ENCOUNTER — Encounter: Payer: Self-pay | Admitting: Gastroenterology

## 2017-10-19 VITALS — BP 181/102 | HR 76 | Ht 66.0 in | Wt 244.0 lb

## 2017-10-19 DIAGNOSIS — R1032 Left lower quadrant pain: Secondary | ICD-10-CM | POA: Diagnosis not present

## 2017-10-19 DIAGNOSIS — K921 Melena: Secondary | ICD-10-CM | POA: Diagnosis not present

## 2017-10-19 DIAGNOSIS — K589 Irritable bowel syndrome without diarrhea: Secondary | ICD-10-CM

## 2017-10-19 DIAGNOSIS — M1710 Unilateral primary osteoarthritis, unspecified knee: Secondary | ICD-10-CM | POA: Insufficient documentation

## 2017-10-19 DIAGNOSIS — M2240 Chondromalacia patellae, unspecified knee: Secondary | ICD-10-CM | POA: Insufficient documentation

## 2017-10-19 DIAGNOSIS — M179 Osteoarthritis of knee, unspecified: Secondary | ICD-10-CM | POA: Insufficient documentation

## 2017-10-19 NOTE — Progress Notes (Signed)
Gastroenterology Consultation  Referring Provider:     McLean-Scocuzza, French Ana * Primary Care Physician:  Sherlene Shams, MD Primary Gastroenterologist:  Dr. Servando Snare     Reason for Consultation:     Left lower quadrant pain        HPI:   Brittany Brooks is a 52 y.o. y/o female referred for consultation & management of Left lower quadrant pain by Dr. [name], Mar Daring, MD.  This patient comes in today after being seen by Dr. McLean-Scocuzza 4 left lower abdominal pain.  The patient has a history of diverticulitis and this was proven with a CT scan back in 2018.  The patient was recently seen in March back in her primary care provider's office with a report of abdominal pain.  At the time of the visit the patient did not have any further abdominal tenderness on physical exam and was given a prescription for antibiotics just in case the symptoms came back. The patient had a CBC at that time which showed a slightly elevated white cell count with a slightly low hemoglobin of 11.2 with an MCV of 77.9. Just prior to having a left low quadrant pain the patient had reported that she had a GI bug that had resolved by the time she saw her primary care provider.The patient denies any unexplained weight loss. The patient does have a history of rectal bleeding.  She denies any abdominal pain at the present time.  There is no report of any family history of colon cancer in any first-degree relatives.The patient also reports that she has had bowel problems that is worse with stress and her menstrual cycle for many years.  Past Medical History:  Diagnosis Date  . Diverticulitis   . Dysrhythmia   . Hyperlipidemia   . Hypertension   . Hypothyroidism    since early 20's, secondary to Hashimoto's  . Multinodular goiter (nontoxic)    biopsy negativre by [facility]  . Pancreatitis 2000   secondary to cholangiogram during lap chole, Sankar  . Sleep apnea     Past Surgical History:  Procedure Laterality Date  .  CARDIAC CATHETERIZATION N/A 08/08/2015   Procedure: Left Heart Cath and Coronary Angiography;  Surgeon: Dalia Heading, MD;  Location: [facility] INVASIVE CV LAB;  Service: Cardiovascular;  Laterality: N/A;  . CESAREAN SECTION  Sept 2000  . CHOLECYSTECTOMY  2000   recurrent cholecystitis during pregnancy    Prior to Admission medications   Medication Sig Start Date End Date Taking? Authorizing Provider  ALPRAZolam (XANAX) 0.25 MG tablet TAKE ONE TABLET AT BEDTIME Patient taking differently: TAKE ONE TABLET AT BEDTIME AS NEEDED. 04/15/17   Shambley, Melody N, CNM  ciprofloxacin (CIPRO) 500 MG tablet Take 1 tablet (500 mg total) by mouth 2 (two) times daily. With food 09/03/17   McLean-Scocuzza, Pasty Spillers, MD  levothyroxine (SYNTHROID, LEVOTHROID) 200 MCG tablet Take 200 mcg by mouth daily before breakfast.    [provider]  metoprolol succinate (TOPROL-XL) 50 MG 24 hr tablet Take 1/2 tablet (25 mg) by mouth once daily- you may take an extra 1/2 tablet (25 mg) by mouth daily as needed for palpitations 08/06/17   Duke Salvia, MD  metroNIDAZOLE (FLAGYL) 500 MG tablet Take 1 tablet (500 mg total) by mouth 2 (two) times daily. With food 09/03/17   McLean-Scocuzza, Pasty Spillers, MD  Multiple Vitamin (MULTIVITAMIN) tablet Take 1 tablet by mouth daily.    [provider]    Family History  Problem Relation Age of Onset  . Hyperlipidemia Mother   . Hypertension Mother   . COPD Mother   . Hyperlipidemia Father   . Heart disease Father 24       CABG x 4  . Diabetes Father   . Stroke Father        TIA  . COPD Father   . Cancer Neg Hx   . Breast cancer Neg Hx      Social History   Tobacco Use  . Smoking status: Never Smoker  . Smokeless tobacco: Never Used  Substance Use Topics  . Alcohol use: Yes    Alcohol/week: 2.4 oz    Types: 4 Glasses of wine per week    Comment: occassional  . Drug use: No    Allergies as of 10/19/2017 - Review Complete 09/06/2017  Allergen Reaction  Noted  . Lexapro [escitalopram oxalate] Other (See Comments) 08/24/2015    Review of Systems:    All systems reviewed and negative except where noted in HPI.   Physical Exam:  There were no vitals taken for this visit. No LMP recorded. Psych:  Alert and cooperative. Normal mood and affect. General:   Alert,  Well-developed, well-nourished, pleasant and cooperative in NAD Head:  Normocephalic and atraumatic. Eyes:  Sclera clear, no icterus.   Conjunctiva pink. Ears:  Normal auditory acuity. Nose:  No deformity, discharge, or lesions. Mouth:  No deformity or lesions,oropharynx pink & moist. Neck:  Supple; no masses or thyromegaly. Lungs:  Respirations even and unlabored.  Clear throughout to auscultation.   No wheezes, crackles, or rhonchi. No acute distress. Heart:  Regular rate and rhythm; no murmurs, clicks, rubs, or gallops. Abdomen:  Normal bowel sounds.  No bruits.  Soft, non-tender and non-distended without masses, hepatosplenomegaly or hernias noted.  No guarding or rebound tenderness.  Negative Carnett sign.   Rectal:  Deferred.  Msk:  Symmetrical without gross deformities.  Good, equal movement & strength bilaterally. Pulses:  Normal pulses noted. Extremities:  No clubbing or edema.  No cyanosis. Neurologic:  Alert and oriented x3;  grossly normal neurologically. Skin:  Intact without significant lesions or rashes.  No jaundice. Lymph Nodes:  No significant cervical adenopathy. Psych:  Alert and cooperative. Normal mood and affect.  Imaging Studies: Mm Digital Screening Bilateral  Result Date: 10/09/2017 CLINICAL DATA:  Screening. EXAM: DIGITAL SCREENING BILATERAL MAMMOGRAM WITH CAD COMPARISON:  Previous exam(s). ACR Breast Density Category c: The breast tissue is heterogeneously dense, which may obscure small masses. FINDINGS: There are no findings suspicious for malignancy. Images were processed with CAD. IMPRESSION: No mammographic evidence of malignancy. A result letter of  this screening mammogram will be mailed directly to the patient. RECOMMENDATION: Screening mammogram in one year. (Code:SM-B-01Y) BI-RADS CATEGORY  1: Negative. Electronically Signed   By: Ted Mcalpine M.D.   On: 10/09/2017 13:00    Assessment and Plan:   Brittany Brooks is a 52 y.o. y/o female who comes in with a history of diverticulitis and left lower quadrant pain.  The patient is no longer in any pain.  The patient also has had some rectal bleeding.  The patient likely has irritable bowel syndrome with long-standing bowel problems made worse with stress and her menstrual cycle. The patient has been told to increase fiber in her diet and will be set up for a colonoscopy. The patient has also had diverticulitis and has been told that if she has recurrent episodes of diverticulitis she should contact my office  for possible antibiotics and if it becomes recurrent then may need surgery in the future. I have discussed risks & benefits which include, but are not limited to, bleeding, infection, perforation & drug reaction.  The patient agrees with this plan & written consent will be obtained.     Midge Minium, MD. Clementeen Graham   Note: This dictation was prepared with Dragon dictation along with smaller phrase technology. Any transcriptional errors that result from this process are unintentional.

## 2017-10-29 ENCOUNTER — Encounter

## 2017-10-29 ENCOUNTER — Ambulatory Visit: Payer: Self-pay | Admitting: Internal Medicine

## 2017-11-19 ENCOUNTER — Encounter: Payer: BLUE CROSS/BLUE SHIELD | Admitting: Obstetrics and Gynecology

## 2017-12-02 ENCOUNTER — Ambulatory Visit: Payer: Self-pay | Admitting: Internal Medicine

## 2017-12-09 ENCOUNTER — Ambulatory Visit (INDEPENDENT_AMBULATORY_CARE_PROVIDER_SITE_OTHER): Payer: BLUE CROSS/BLUE SHIELD | Admitting: Obstetrics and Gynecology

## 2017-12-09 ENCOUNTER — Encounter: Payer: Self-pay | Admitting: Obstetrics and Gynecology

## 2017-12-09 ENCOUNTER — Other Ambulatory Visit: Payer: Self-pay | Admitting: Obstetrics and Gynecology

## 2017-12-09 VITALS — Ht 66.0 in | Wt 244.9 lb

## 2017-12-09 DIAGNOSIS — Z01419 Encounter for gynecological examination (general) (routine) without abnormal findings: Secondary | ICD-10-CM

## 2017-12-09 NOTE — Progress Notes (Signed)
Subjective:   Brittany Brooks is a 52 y.o. G1P0 Caucasian female here for a routine well-woman exam.  Patient's last menstrual period was 11/13/2017.    Current complaints: none except menses are irregular PCP: Tullo       doesn't desire labs  Social History: Sexual: heterosexual Marital Status: married Living situation: with spouse Occupation: [employer] Tobacco/alcohol: no tobacco use Illicit drugs: no history of illicit drug use  The following portions of the patient's history were reviewed and updated as appropriate: allergies, current medications, past family history, past medical history, past social history, past surgical history and problem list.  Past Medical History Past Medical History:  Diagnosis Date  . Diverticulitis   . Dysrhythmia   . Hyperlipidemia   . Hypertension   . Hypothyroidism    since early 20's, secondary to Hashimoto's  . Multinodular goiter (nontoxic)    biopsy negativre by [facility]  . Pancreatitis 2000   secondary to cholangiogram during lap chole, Sankar  . Sleep apnea     Past Surgical History Past Surgical History:  Procedure Laterality Date  . CARDIAC CATHETERIZATION N/A 08/08/2015   Procedure: Left Heart Cath and Coronary Angiography;  Surgeon: Dalia Heading, MD;  Location: [facility] INVASIVE CV LAB;  Service: Cardiovascular;  Laterality: N/A;  . CESAREAN SECTION  Sept 2000  . CHOLECYSTECTOMY  2000   recurrent cholecystitis during pregnancy    Gynecologic History G1P0  Patient's last menstrual period was 11/13/2017. Contraception: abstinence Last Pap: 2016. Results were: normal Last mammogram: 10/2017. Results were: normal   Obstetric History OB History  Gravida Para Term Preterm AB Living  1         1  SAB TAB Ectopic Multiple Live Births          1    # Outcome Date GA Lbr Len/2nd Weight Sex Delivery Anes PTL Lv  1 Gravida 2000    M CS-Unspec   LIV    Current Medications Current Outpatient Medications on File Prior to Visit   Medication Sig Dispense Refill  . ALPRAZolam (XANAX) 0.25 MG tablet TAKE ONE TABLET AT BEDTIME (Patient taking differently: TAKE ONE TABLET AT BEDTIME AS NEEDED.) 30 tablet 2  . levothyroxine (SYNTHROID, LEVOTHROID) 200 MCG tablet Take 200 mcg by mouth daily before breakfast.    . metoprolol succinate (TOPROL-XL) 50 MG 24 hr tablet Take 1/2 tablet (25 mg) by mouth once daily- you may take an extra 1/2 tablet (25 mg) by mouth daily as needed for palpitations 30 tablet 6  . Multiple Vitamin (MULTIVITAMIN) tablet Take 1 tablet by mouth daily.     No current facility-administered medications on file prior to visit.     Review of Systems Patient denies any headaches, blurred vision, shortness of breath, chest pain, abdominal pain, problems with bowel movements, urination, or intercourse.  Objective:  Ht 5\' 6"  (1.676 m)   Wt 244 lb 14.4 oz (111.1 kg)   LMP 11/13/2017   BMI 39.53 kg/m  Physical Exam  General:  Well developed, well nourished, no acute distress. She is alert and oriented x3. Skin:  Warm and dry Neck:  Midline trachea, no thyromegaly or nodules Cardiovascular: Regular rate and rhythm, no murmur heard Lungs:  Effort normal, all lung fields clear to auscultation bilaterally Breasts:  No dominant palpable mass, retraction, or nipple discharge Abdomen:  Soft, non tender, no hepatosplenomegaly or masses Pelvic:  External genitalia is normal in appearance.  The vagina is normal in appearance. The cervix is bulbous,  no CMT.  Thin prep pap is done with HR HPV cotesting. Uterus is felt to be normal size, shape, and contour.  No adnexal masses or tenderness noted. Extremities:  No swelling or varicosities noted Psych:  She has a normal mood and affect  Assessment:   Healthy well-woman exam Obesity Hypothyroidism   Plan:   F/U 1 year for AE, or sooner if needed   Brittany Brooks Suzan NailerN Kooper Godshall, CNM

## 2017-12-14 LAB — CYTOLOGY - PAP

## 2018-01-12 ENCOUNTER — Ambulatory Visit: Payer: BLUE CROSS/BLUE SHIELD | Admitting: Internal Medicine

## 2018-01-12 ENCOUNTER — Encounter: Payer: Self-pay | Admitting: Internal Medicine

## 2018-01-12 VITALS — BP 142/90 | HR 97 | Temp 98.8°F | Resp 15 | Ht 66.0 in | Wt 247.2 lb

## 2018-01-12 DIAGNOSIS — R7303 Prediabetes: Secondary | ICD-10-CM | POA: Diagnosis not present

## 2018-01-12 DIAGNOSIS — E559 Vitamin D deficiency, unspecified: Secondary | ICD-10-CM

## 2018-01-12 DIAGNOSIS — E030 Congenital hypothyroidism with diffuse goiter: Secondary | ICD-10-CM

## 2018-01-12 DIAGNOSIS — E042 Nontoxic multinodular goiter: Secondary | ICD-10-CM

## 2018-01-12 DIAGNOSIS — E785 Hyperlipidemia, unspecified: Secondary | ICD-10-CM | POA: Diagnosis not present

## 2018-01-12 NOTE — Progress Notes (Signed)
Subjective:  Patient ID: Brittany IhaKaren C Demas, female    DOB: 1965-12-23  Age: 52 y.o. MRN: [id number]  CC: The primary encounter diagnosis was Prediabetes. Diagnoses of Congenital hypothyroidism with diffuse goiter, Vitamin D deficiency, Hyperlipidemia LDL goal <100, Multinodular goiter (nontoxic), and Pre-diabetes were also pertinent to this visit.  HPI Brittany Brooks presents for follow up on hypothyroidism   Dose [facility] from 175 mcg to 200 [facility]  for goal TSH of 0.4 .  Tolerating the dose,  No tremors,  No tiredness Using alprazolam prn   Frustrated and unable to lose weight.  Significant stressors ;  Husband Patrecia PaceStuart Askari has been out of work for 3 years due to diabetic foot ulcer with recurrent surgeries due to osteomyelitis. ,  Financially strained,  Husband unmotivated , watches TV 8 hours day,  Does not try to better himself .  She feels she has no time to exercise  Or cook healthy food.     Outpatient Medications Prior to Visit  Medication Sig Dispense Refill  . ALPRAZolam (XANAX) 0.25 MG tablet TAKE ONE TABLET AT BEDTIME (Patient taking differently: TAKE ONE TABLET AT BEDTIME AS NEEDED.) 30 tablet 2  . metoprolol succinate (TOPROL-XL) 50 MG 24 hr tablet Take 1/2 tablet (25 mg) by mouth once daily- you may take an extra 1/2 tablet (25 mg) by mouth daily as needed for palpitations 30 tablet 6  . Multiple Vitamin (MULTIVITAMIN) tablet Take 1 tablet by mouth daily.    . Omega-3 Fatty Acids (FISH OIL) 1000 MG CAPS Take 1 capsule by mouth daily.    Marland Kitchen. levothyroxine (SYNTHROID, LEVOTHROID) 200 MCG tablet Take 200 mcg by mouth daily before breakfast.    . Olopatadine HCl 0.2 % SOLN   98   No facility-administered medications prior to visit.     Review of Systems;  Patient denies headache, fevers, malaise, unintentional weight loss, skin rash, eye pain, sinus congestion and sinus pain, sore throat, dysphagia,  hemoptysis , cough, dyspnea, wheezing, chest  pain, palpitations, orthopnea, edema, abdominal pain, nausea, melena, diarrhea, constipation, flank pain, dysuria, hematuria, urinary  Frequency, nocturia, numbness, tingling, seizures,  Focal weakness, Loss of consciousness,  Tremor, insomnia, depression, anxiety, and suicidal ideation.      Objective:  BP (!) 142/90 (BP Location: Left Arm, Patient Position: Sitting, Cuff Size: Large)   Pulse 97   Temp 98.8 F (37.1 C) (Oral)   Resp 15   Ht 5\' 6"  (1.676 m)   Wt 247 lb 3.2 oz (112.1 kg)   SpO2 96%   BMI 39.90 kg/m   BP Readings from Last 3 Encounters:  01/12/18 (!) 142/90  10/19/17 (!) 181/102  09/03/17 (!) 148/94    Wt Readings from Last 3 Encounters:  01/12/18 247 lb 3.2 oz (112.1 kg)  12/09/17 244 lb 14.4 oz (111.1 kg)  10/19/17 244 lb (110.7 kg)    General appearance: alert, cooperative and appears stated age Ears: normal TM's and external ear canals both ears Throat: lips, mucosa, and tongue normal; teeth and gums normal Neck: no adenopathy, no carotid bruit, supple, symmetrical, trachea midline and thyroid not enlarged, symmetric, no tenderness/mass/nodules Back: symmetric, no curvature. ROM normal. No CVA tenderness. Lungs: clear to auscultation bilaterally Heart: regular rate and rhythm, S1, S2 normal, no murmur, click, rub or gallop Abdomen: soft, non-tender; bowel sounds normal; no masses,  no organomegaly Pulses: 2+ and symmetric Skin: Skin color, texture, turgor normal. No rashes or  lesions Lymph nodes: Cervical, supraclavicular, and axillary nodes normal.  Lab Results  Component Value Date   HGBA1C 6.3 01/12/2018   HGBA1C 5.8 (H) 05/22/2016   HGBA1C 6.1 (H) 05/17/2015    Lab Results  Component Value Date   CREATININE 0.75 01/12/2018   CREATININE 0.60 09/03/2017   CREATININE 0.68 03/12/2017    Lab Results  Component Value Date   WBC 10.6 (H) 09/03/2017   HGB 11.2 (L) 09/03/2017   HCT 34.2 (L) 09/03/2017   PLT 330.0 09/03/2017   GLUCOSE 104 (H)  01/12/2018   CHOL 265 (H) 08/06/2017   TRIG 341 (H) 08/06/2017   HDL 37 (L) 08/06/2017   LDLDIRECT 166.0 01/12/2018   LDLCALC 160 (H) 08/06/2017   ALT 22 01/12/2018   AST 21 01/12/2018   NA 137 01/12/2018   K 4.0 01/12/2018   CL 101 01/12/2018   CREATININE 0.75 01/12/2018   BUN 15 01/12/2018   CO2 29 01/12/2018   TSH 1.83 01/12/2018   HGBA1C 6.3 01/12/2018    Mm Digital Screening Bilateral  Result Date: 10/09/2017 CLINICAL DATA:  Screening. EXAM: DIGITAL SCREENING BILATERAL MAMMOGRAM WITH CAD COMPARISON:  Previous exam(s). ACR Breast Density Category c: The breast tissue is heterogeneously dense, which may obscure small masses. FINDINGS: There are no findings suspicious for malignancy. Images were processed with CAD. IMPRESSION: No mammographic evidence of malignancy. A result letter of this screening mammogram will be mailed directly to the patient. RECOMMENDATION: Screening mammogram in one year. (Code:SM-B-01Y) BI-RADS CATEGORY  1: Negative. Electronically Signed   By: Ted Mcalpine M.D.   On: 10/09/2017 13:00    Assessment & Plan:   Problem List Items Addressed This Visit    Multinodular goiter (nontoxic)    Thyroid function is normal today, but her previous goal has been TSH < 1.9  Will increase dose  To 225 mcg    Lab Results  Component Value Date   TSH 1.83 01/12/2018         Relevant Medications   levothyroxine (SYNTHROID, LEVOTHROID) 200 MCG tablet   Levothyroxine Sodium 25 MCG CAPS   Hypothyroidism    increasig dose to 225 mcg daily for TSH well over 1.0  Lab Results  Component Value Date   TSH 1.83 01/12/2018         Relevant Medications   levothyroxine (SYNTHROID, LEVOTHROID) 200 MCG tablet   Levothyroxine Sodium 25 MCG CAPS   Other Relevant Orders   TSH (Completed)   Pre-diabetes    A1c has risen to 6.3 . I have addressed  BMI and A1c and recommended wt loss of 10% of body weigh over the next 6 months using a low glycemic index diet and regular  exercise a minimum of 5 days per week.        Vitamin D deficiency    Drisdol prescribed for 12 weeks       Relevant Orders   VITAMIN D 25 Hydroxy (Vit-D Deficiency, Fractures) (Completed)    Other Visit Diagnoses    Prediabetes    -  Primary   Relevant Orders   Hemoglobin A1c (Completed)   Comprehensive metabolic panel (Completed)   Hyperlipidemia LDL goal <100       Relevant Orders   LDL cholesterol, direct (Completed)      I have changed Clydie Braun C. Elster's levothyroxine. I am also having her start on Levothyroxine Sodium and ergocalciferol. Additionally, I am having her maintain her multivitamin, ALPRAZolam, metoprolol succinate, Olopatadine HCl, and Fish Oil.  Meds ordered this encounter  Medications  . levothyroxine (SYNTHROID, LEVOTHROID) 200 MCG tablet    Sig: Take 1 tablet (200 mcg total) by mouth daily before breakfast.    Dispense:  90 tablet    Refill:  1    TOTAL DAILY DOSE 225 MCG  . Levothyroxine Sodium 25 MCG CAPS    Sig: 1 capsule daily in the morning .    Dispense:  90 capsule    Refill:  1    TOTAL DAILY DOSE 225 MCG  . ergocalciferol (DRISDOL) 50000 units capsule    Sig: Take 1 capsule (50,000 Units total) by mouth once a week.    Dispense:  12 capsule    Refill:  0   A total of 25 minutes of face to face time was spent with patient more than half of which was spent in counselling about the above mentioned conditions  and coordination of care   Medications Discontinued During This Encounter  Medication Reason  . levothyroxine (SYNTHROID, LEVOTHROID) 200 MCG tablet     Follow-up: Return in about 6 months (around 07/15/2018).   Sherlene Shams, MD

## 2018-01-12 NOTE — Patient Instructions (Addendum)
We will continue thyroid supplementation with goal TSH 0.4 as long as you do not feel tremulous,  Nervous or experience a rapid heart rate at rest (pulse over 100 at rest)  your blood pressure is elevated today  the currently recommended acceptable standards are 120/70, so I am recommending that you get it checked a few times over the next month and let me know the results.

## 2018-01-13 ENCOUNTER — Encounter: Payer: Self-pay | Admitting: Internal Medicine

## 2018-01-13 LAB — COMPREHENSIVE METABOLIC PANEL
ALK PHOS: 51 U/L (ref 39–117)
ALT: 22 U/L (ref 0–35)
AST: 21 U/L (ref 0–37)
Albumin: 4.1 g/dL (ref 3.5–5.2)
BILIRUBIN TOTAL: 0.3 mg/dL (ref 0.2–1.2)
BUN: 15 mg/dL (ref 6–23)
CALCIUM: 9.2 mg/dL (ref 8.4–10.5)
CO2: 29 mEq/L (ref 19–32)
Chloride: 101 mEq/L (ref 96–112)
Creatinine, Ser: 0.75 mg/dL (ref 0.40–1.20)
GFR: 87.04 mL/min (ref >60.00)
Glucose, Bld: 104 mg/dL — ABNORMAL HIGH (ref 70–99)
POTASSIUM: 4 meq/L (ref 3.5–5.1)
Sodium: 137 mEq/L (ref 135–145)
TOTAL PROTEIN: 7.4 g/dL (ref 6.0–8.3)

## 2018-01-13 LAB — LDL CHOLESTEROL, DIRECT: LDL DIRECT: 166 mg/dL

## 2018-01-13 LAB — TSH: TSH: 1.83 u[IU]/mL (ref 0.35–4.50)

## 2018-01-13 LAB — HEMOGLOBIN A1C: Hgb A1c MFr Bld: 6.3 % (ref 4.6–6.5)

## 2018-01-13 LAB — VITAMIN D 25 HYDROXY (VIT D DEFICIENCY, FRACTURES): VITD: 16.21 ng/mL — ABNORMAL LOW (ref 30.00–100.00)

## 2018-01-13 MED ORDER — ERGOCALCIFEROL 1.25 MG (50000 UT) PO CAPS
50000.0000 [IU] | ORAL_CAPSULE | ORAL | 0 refills | Status: DC
Start: 2018-01-13 — End: 2018-05-18

## 2018-01-13 MED ORDER — LEVOTHYROXINE SODIUM 200 MCG PO TABS: 200.0000 ug | ORAL_TABLET | Freq: Every day | ORAL | 1 refills | Status: DC

## 2018-01-13 MED ORDER — LEVOTHYROXINE SODIUM 25 MCG PO CAPS: ORAL_CAPSULE | ORAL | 1 refills | Status: DC

## 2018-01-13 NOTE — Assessment & Plan Note (Signed)
Drisdol prescribed for 12 weeks

## 2018-01-13 NOTE — Assessment & Plan Note (Signed)
Thyroid function is normal today, but her previous goal has been TSH < 1.9  Will increase dose  To 225 mcg    Lab Results  Component Value Date   TSH 1.83 01/12/2018

## 2018-01-13 NOTE — Assessment & Plan Note (Addendum)
A1c has risen to 6.3 . I have addressed  BMI and A1c and recommended wt loss of 10% of body weigh over the next 6 months using a low glycemic index diet and regular exercise a minimum of 5 days per week.

## 2018-01-13 NOTE — Assessment & Plan Note (Signed)
increasig dose to 225 mcg daily for TSH well over 1.0  Lab Results  Component Value Date   TSH 1.83 01/12/2018

## 2018-01-17 ENCOUNTER — Other Ambulatory Visit: Payer: Self-pay | Admitting: Internal Medicine

## 2018-01-17 MED ORDER — LEVOTHYROXINE SODIUM 200 MCG PO TABS: 200.0000 ug | ORAL_TABLET | Freq: Every day | ORAL | 1 refills | Status: DC

## 2018-02-09 ENCOUNTER — Telehealth: Payer: Self-pay | Admitting: Internal Medicine

## 2018-02-09 DIAGNOSIS — R002 Palpitations: Secondary | ICD-10-CM

## 2018-02-09 DIAGNOSIS — E781 Pure hyperglyceridemia: Secondary | ICD-10-CM

## 2018-02-09 NOTE — Telephone Encounter (Signed)
I left a message for the patient to call. 

## 2018-02-09 NOTE — Telephone Encounter (Signed)
Patient c/o Palpitations:  High priority if patient c/o lightheadedness, shortness of breath, or chest pain  1) How long have you had palpitations/irregular HR/ Afib? Are you having the symptoms now? Late Saturday . Yes interim   2) Are you currently experiencing lightheadedness, SOB or CP? Lightheaded briefly  when having pvc's   3) Do you have a history of afib (atrial fibrillation) or irregular heart rhythm? Yes   4) Have you checked your BP or HR? (document readings if available): HR is in 70's feels regular   5) Are you experiencing any other symptoms?  No concerned that may need to see Dr. Graciela Husbands

## 2018-02-10 ENCOUNTER — Other Ambulatory Visit
Admission: RE | Admit: 2018-02-10 | Discharge: 2018-02-10 | Disposition: A | Discharge status: Home / Self Care | Payer: BLUE CROSS/BLUE SHIELD | Source: Ambulatory Visit | Attending: Internal Medicine | Admitting: Internal Medicine

## 2018-02-10 DIAGNOSIS — R002 Palpitations: Secondary | ICD-10-CM | POA: Insufficient documentation

## 2018-02-10 DIAGNOSIS — E781 Pure hyperglyceridemia: Secondary | ICD-10-CM | POA: Diagnosis not present

## 2018-02-10 LAB — BASIC METABOLIC PANEL
Anion gap: 9 (ref 5–15)
BUN: 17 mg/dL (ref 6–20)
CALCIUM: 9.1 mg/dL (ref 8.9–10.3)
CHLORIDE: 104 mmol/L (ref 98–111)
CO2: 26 mmol/L (ref 22–32)
Creatinine, Ser: 0.67 mg/dL (ref 0.44–1.00)
GFR calc Af Amer: >60 mL/min (ref >60)
Glucose, Bld: 106 mg/dL — ABNORMAL HIGH (ref 70–99)
POTASSIUM: 4.1 mmol/L (ref 3.5–5.1)
SODIUM: 139 mmol/L (ref 135–145)

## 2018-02-10 LAB — CBC WITH DIFFERENTIAL/PLATELET
Basophils Absolute: 0.1 10*3/uL (ref 0–0.1)
Basophils Relative: 1 %
EOS ABS: 0.3 10*3/uL (ref 0–0.7)
Eosinophils Relative: 4 %
HCT: 38 % (ref 35.0–47.0)
Hemoglobin: 12.7 g/dL (ref 12.0–16.0)
LYMPHS ABS: 1.8 10*3/uL (ref 1.0–3.6)
LYMPHS PCT: 25 %
MCH: 26.6 pg (ref 26.0–34.0)
MCHC: 33.4 g/dL (ref 32.0–36.0)
MCV: 79.5 fL — AB (ref 80.0–100.0)
Monocytes Absolute: 0.7 10*3/uL (ref 0.2–0.9)
Monocytes Relative: 9 %
NEUTROS ABS: 4.5 10*3/uL (ref 1.4–6.5)
Neutrophils Relative %: 61 %
Platelets: 302 10*3/uL (ref 150–440)
RBC: 4.78 MIL/uL (ref 3.80–5.20)
RDW: 15.7 % — ABNORMAL HIGH (ref 11.5–14.5)
WBC: 7.3 10*3/uL (ref 3.6–11.0)

## 2018-02-10 LAB — LIPID PANEL
CHOLESTEROL: 259 mg/dL — AB (ref 0–200)
HDL: 34 mg/dL — ABNORMAL LOW (ref >40)
LDL Cholesterol: 193 mg/dL — ABNORMAL HIGH (ref 0–99)
Total CHOL/HDL Ratio: 7.6 RATIO
Triglycerides: 161 mg/dL — ABNORMAL HIGH (ref <150)
VLDL: 32 mg/dL (ref 0–40)

## 2018-02-10 LAB — MAGNESIUM: Magnesium: 2.3 mg/dL (ref 1.7–2.4)

## 2018-02-10 NOTE — Telephone Encounter (Signed)
Labs resulted today. Will forward to Dr. Graciela Husbands to review patient symptoms and for further recommendations.

## 2018-02-10 NOTE — Telephone Encounter (Signed)
I spoke with the patient this morning. She states that she started having palpitations/ PVC's on Saturday morning about 2am.  She states it is not uncommon for her to have PVC's, but they are usually intermittent. However, they have been fairly persistent since Saturday morning.  HR's are ok, but she has not checked her BP. She does experience some lightheadedness with palpitations.  Confirmed with the patient that she typically takes toprol 50 mg - 1/2 tablet (25 mg) by mouth once daily, with an extra 1/2 tablet (25 mg) PRN for palpitations. Per Ms. Herra, she has been taking the full toprol 50 mg tablet once daily and sometimes taking and extra 1/2 tablet (25 mg) on top of that to try to settle down her palpitations.  She is staying hydrated. She has had no caffeine since Saturday. The patient reports the only change she has made recently is starting the Keto diet.   We have discussed that if her electrolytes get out of balance that this can trigger palpitations for her. She reports she is taking a magnesium supplement, but her magnesium level has not been checked.   I have advised the patient that we should check some lab work on her to ensure her electrolytes are in balance.  Her last CBC was in 08/2017 and her Hgb had trended down to 11.2. Will order a BMP/ Magnesium/ CBC on the patient. She states Dr. [name] wanted to check a lipid profile on her at her last visit but she wasn't fasting. The patient asked if we could check this today. I advised I will order a lipid panel as well since she is fasting and we can forward the results to Dr. [name].   The patient will go to the [facility] at [facility]- 1st desk on the right to check today. She is aware I will look for her results and then discuss with Dr. Graciela Husbands prior to calling her back. She is agreeable with the above.

## 2018-02-11 NOTE — Telephone Encounter (Signed)
If she is going to come in lets also get a 12 lead   She has had both atrial tach and PAC/PVCs in the past  thnks

## 2018-02-12 ENCOUNTER — Ambulatory Visit (INDEPENDENT_AMBULATORY_CARE_PROVIDER_SITE_OTHER): Payer: BLUE CROSS/BLUE SHIELD | Admitting: *Deleted

## 2018-02-12 ENCOUNTER — Ambulatory Visit (INDEPENDENT_AMBULATORY_CARE_PROVIDER_SITE_OTHER): Payer: BLUE CROSS/BLUE SHIELD

## 2018-02-12 VITALS — BP 144/78 | HR 82 | Ht 66.0 in | Wt 236.8 lb

## 2018-02-12 DIAGNOSIS — R002 Palpitations: Secondary | ICD-10-CM | POA: Diagnosis not present

## 2018-02-12 DIAGNOSIS — I493 Ventricular premature depolarization: Secondary | ICD-10-CM | POA: Diagnosis not present

## 2018-02-12 NOTE — Patient Instructions (Signed)
Medication Instructions: - Your physician recommends that you continue on your current medications as directed. Please refer to the Current Medication list given to you today.  Labwork: - none ordered  Procedures/Testing: - 3 day ZIO applied  Follow-Up: - pending monitor results  Any Additional Special Instructions Will Be Listed Below (If Applicable).     If you need a refill on your cardiac medications before your next appointment, please call your pharmacy.

## 2018-02-12 NOTE — Progress Notes (Signed)
1.) Reason for visit: EKG  2.) Name of MD requesting visit: Graciela Husbands  3.) H&P: The patient has a history of palpitations with PVC's/ PAC's noted on a previous monitor. She has been doing well until this past Saturday morning when she started to have frequent palpitations. We did order lab work on her earlier this week (BMP/ CBC/ Magnesium), which was within normal limits. Her TSH was checked last month by Dr. [name] and was ok at that time. The patient reports she knows the normal triggers that will set off her PVC's, but none of these things have changed. She does report that she recently started the Keto diet about 3 weeks ago and she has lost 10 lbs.   4.) ROS related to problem: The patient states she has still been having frequent palpitations. An EKG was obtained today that showed NSR with a HR of 82 bpm.  A rhythm strip was obtained showing that the patient was having some infrequent PVC's. I did see 1 PAC which I was unable to obtain on a recording. The patient states that she is having more fatigue since Saturday. She does seem to notice quite a few palpitations at night. She typically take toprol 25 mg once daily, but has been taking a full toprol 50 mg once daily at night since Saturday.  5.) Assessment and plan per MD: The patient is aware I have tried to contact Dr. Graciela Husbands to review her tracings while here in the office. She reports that she continued to have a lot of palpitations while I was out of the room. I was unable to reach Dr. Graciela Husbands while the patient was here, but did offer her outpatient monitoring to assess her rhythm further. The patient did want to do this. I applied a 3 day ZIO monitor. She is aware I will review further with Dr. Graciela Husbands and call her with any further recommendations once he gets back with me. The patient voices understanding and is agreeable.

## 2018-02-12 NOTE — Telephone Encounter (Signed)
Message sent to the patient via MyChart yesterday evening to please let us know if she could come in for an EKG today and what time.

## 2018-02-14 DIAGNOSIS — E030 Congenital hypothyroidism with diffuse goiter: Secondary | ICD-10-CM

## 2018-02-16 ENCOUNTER — Other Ambulatory Visit: Payer: BLUE CROSS/BLUE SHIELD

## 2018-02-16 NOTE — Addendum Note (Signed)
Addended by: Warden Fillers on: 02/16/2018 10:18 AM   Modules accepted: Orders

## 2018-02-18 ENCOUNTER — Other Ambulatory Visit: Payer: BLUE CROSS/BLUE SHIELD

## 2018-02-19 DIAGNOSIS — R002 Palpitations: Secondary | ICD-10-CM | POA: Diagnosis not present

## 2018-03-02 DIAGNOSIS — M7612 Psoas tendinitis, left hip: Secondary | ICD-10-CM | POA: Diagnosis not present

## 2018-03-02 DIAGNOSIS — M7611 Psoas tendinitis, right hip: Secondary | ICD-10-CM | POA: Diagnosis not present

## 2018-03-02 DIAGNOSIS — M9903 Segmental and somatic dysfunction of lumbar region: Secondary | ICD-10-CM | POA: Diagnosis not present

## 2018-03-02 DIAGNOSIS — M5386 Other specified dorsopathies, lumbar region: Secondary | ICD-10-CM | POA: Diagnosis not present

## 2018-03-09 ENCOUNTER — Telehealth: Payer: Self-pay | Admitting: Internal Medicine

## 2018-03-09 DIAGNOSIS — M7611 Psoas tendinitis, right hip: Secondary | ICD-10-CM | POA: Diagnosis not present

## 2018-03-09 DIAGNOSIS — M5386 Other specified dorsopathies, lumbar region: Secondary | ICD-10-CM | POA: Diagnosis not present

## 2018-03-09 DIAGNOSIS — M7612 Psoas tendinitis, left hip: Secondary | ICD-10-CM | POA: Diagnosis not present

## 2018-03-09 DIAGNOSIS — M9903 Segmental and somatic dysfunction of lumbar region: Secondary | ICD-10-CM | POA: Diagnosis not present

## 2018-03-09 NOTE — Telephone Encounter (Signed)
Dr. Graciela Husbands reviewed the patient's ZIO monitor:  "Findings SVT >> atrial tach with at least two morphology PVC 6% monomorphic NO sympltoms reported  How are her PVCs doing"   Notes recorded by Jefferey Pica, RN on 03/09/2018 at 10:49 AM EDT The patient is aware of her results. She states she is still having palpitations every day.  I offered her an appointment to follow up with Dr. Graciela Husbands next week to discuss- she would rather see if there is any further testing that he would recommend & then follow up with him after. I advised her I would follow up with Dr. Graciela Husbands next week when he is back in the office and call her with further recommendations.  The patient voices understanding and is agreeable.

## 2018-03-17 NOTE — Telephone Encounter (Signed)
Per Dr. Graciela Husbands- use the [employer] app to track symptoms lasting > 30 seconds.   MyChart message sent to the patient to notify her of this.

## 2018-03-23 DIAGNOSIS — R7303 Prediabetes: Secondary | ICD-10-CM

## 2018-03-23 DIAGNOSIS — E782 Mixed hyperlipidemia: Secondary | ICD-10-CM

## 2018-04-16 ENCOUNTER — Other Ambulatory Visit: Payer: Self-pay

## 2018-04-27 DIAGNOSIS — Z23 Encounter for immunization: Secondary | ICD-10-CM | POA: Diagnosis not present

## 2018-04-28 ENCOUNTER — Other Ambulatory Visit: Payer: Self-pay | Admitting: Internal Medicine

## 2018-04-28 DIAGNOSIS — R7303 Prediabetes: Secondary | ICD-10-CM

## 2018-04-28 DIAGNOSIS — E559 Vitamin D deficiency, unspecified: Secondary | ICD-10-CM

## 2018-04-28 NOTE — Progress Notes (Signed)
a1c

## 2018-05-12 ENCOUNTER — Other Ambulatory Visit (INDEPENDENT_AMBULATORY_CARE_PROVIDER_SITE_OTHER): Payer: BLUE CROSS/BLUE SHIELD

## 2018-05-12 DIAGNOSIS — E559 Vitamin D deficiency, unspecified: Secondary | ICD-10-CM | POA: Diagnosis not present

## 2018-05-12 DIAGNOSIS — E030 Congenital hypothyroidism with diffuse goiter: Secondary | ICD-10-CM

## 2018-05-12 DIAGNOSIS — E782 Mixed hyperlipidemia: Secondary | ICD-10-CM | POA: Diagnosis not present

## 2018-05-12 DIAGNOSIS — R7303 Prediabetes: Secondary | ICD-10-CM | POA: Diagnosis not present

## 2018-05-12 LAB — COMPREHENSIVE METABOLIC PANEL
ALT: 23 U/L (ref 0–35)
AST: 21 U/L (ref 0–37)
Albumin: 4.3 g/dL (ref 3.5–5.2)
Alkaline Phosphatase: 50 U/L (ref 39–117)
BILIRUBIN TOTAL: 0.4 mg/dL (ref 0.2–1.2)
BUN: 14 mg/dL (ref 6–23)
CALCIUM: 9.3 mg/dL (ref 8.4–10.5)
CO2: 28 mEq/L (ref 19–32)
Chloride: 102 mEq/L (ref 96–112)
Creatinine, Ser: 0.68 mg/dL (ref 0.40–1.20)
GFR: 97.34 mL/min (ref >60.00)
GLUCOSE: 101 mg/dL — AB (ref 70–99)
POTASSIUM: 4.4 meq/L (ref 3.5–5.1)
Sodium: 138 mEq/L (ref 135–145)
Total Protein: 7.5 g/dL (ref 6.0–8.3)

## 2018-05-12 LAB — LIPID PANEL
CHOL/HDL RATIO: 7
Cholesterol: 301 mg/dL — ABNORMAL HIGH (ref 0–200)
HDL: 44.8 mg/dL (ref >39.00)
LDL Cholesterol: 218 mg/dL — ABNORMAL HIGH (ref 0–99)
NonHDL: 255.78
TRIGLYCERIDES: 189 mg/dL — AB (ref 0.0–149.0)
VLDL: 37.8 mg/dL (ref 0.0–40.0)

## 2018-05-12 LAB — HEMOGLOBIN A1C: Hgb A1c MFr Bld: 6.1 % (ref 4.6–6.5)

## 2018-05-12 LAB — VITAMIN D 25 HYDROXY (VIT D DEFICIENCY, FRACTURES): VITD: 29.1 ng/mL — AB (ref 30.00–100.00)

## 2018-05-12 LAB — TSH: TSH: 4.06 u[IU]/mL (ref 0.35–4.50)

## 2018-05-18 ENCOUNTER — Ambulatory Visit: Payer: BLUE CROSS/BLUE SHIELD | Admitting: Internal Medicine

## 2018-05-18 ENCOUNTER — Encounter: Payer: Self-pay | Admitting: Internal Medicine

## 2018-05-18 VITALS — BP 144/90 | HR 92 | Temp 98.1°F | Resp 15 | Ht 66.0 in | Wt 231.8 lb

## 2018-05-18 DIAGNOSIS — E669 Obesity, unspecified: Secondary | ICD-10-CM

## 2018-05-18 DIAGNOSIS — R7303 Prediabetes: Secondary | ICD-10-CM

## 2018-05-18 DIAGNOSIS — E039 Hypothyroidism, unspecified: Secondary | ICD-10-CM

## 2018-05-18 DIAGNOSIS — E782 Mixed hyperlipidemia: Secondary | ICD-10-CM

## 2018-05-18 DIAGNOSIS — E030 Congenital hypothyroidism with diffuse goiter: Secondary | ICD-10-CM | POA: Diagnosis not present

## 2018-05-18 DIAGNOSIS — N951 Menopausal and female climacteric states: Secondary | ICD-10-CM

## 2018-05-18 MED ORDER — ERGOCALCIFEROL 1.25 MG (50000 UT) PO CAPS: 50000.0000 [IU] | ORAL_CAPSULE | ORAL | 0 refills | Status: DC

## 2018-05-18 NOTE — Patient Instructions (Signed)
Continue weekly D vitamin    Add one extra  Dose  Of thyroid medication  per week    Find 2 days per week to exercise 30  minutes of cardio    You can try Estroven for your hot flashes    Please consider a trial of red Yeast Rice as a natural remedy, for your elevated cholesterol.  T the dose is 600 mg twice daily in capsule form, available OTC.

## 2018-05-18 NOTE — Progress Notes (Signed)
Subjective:  Patient ID: Brittany Brooks, female    DOB: [birth year]  Age: 52 y.o. MRN: [id number]  CC: The primary encounter diagnosis was Congenital hypothyroidism with diffuse goiter. Diagnoses of Mixed hyperlipidemia, Pre-diabetes, Acquired hypothyroidism, Obesity (BMI 30-39.9), and Perimenopause were also pertinent to this visit.  HPI Brittany Brooks presents for follow up on  obesity, hypertension, hyperlipidemia,  and prediabetes .  She Since starting the KETO die,  t she has lost 20 lbs , but she is frustrated at her  Rate of loss and her rising LDL . Marland Kitchen.  She is not exercising .    Her last menses was in August .  Her   sleep  Is often disrupted by hot flashes and anxiety .  She feels irritable a lot   Lipids reviewed ,FRC 11 to 14%   Outpatient Medications Prior to Visit  Medication Sig Dispense Refill  . ALPRAZolam (XANAX) 0.25 MG tablet TAKE ONE TABLET AT BEDTIME (Patient taking differently: TAKE ONE TABLET AT BEDTIME AS NEEDED.) 30 tablet 2  . Calcium Citrate 200 MG TABS Take 1 tablet by mouth once daily    . MAGNESIUM CITRATE PO Take 1 tablet by mouth three times a day    . metoprolol succinate (TOPROL-XL) 50 MG 24 hr tablet Take 1/2 tablet (25 mg) by mouth once daily- you may take an extra 1/2 tablet (25 mg) by mouth daily as needed for palpitations 30 tablet 6  . Multiple Vitamin (MULTIVITAMIN) tablet Take 1 tablet by mouth daily.    . Olopatadine HCl 0.2 % SOLN   98  . Omega-3 Fatty Acids (FISH OIL) 1000 MG CAPS Take 1 capsule by mouth daily.    . ergocalciferol (DRISDOL) 50000 units capsule Take 1 capsule (50,000 Units total) by mouth once a week. 12 capsule 0  . levothyroxine (SYNTHROID, LEVOTHROID) 200 MCG tablet Take 1 tablet (200 mcg total) by mouth daily before breakfast. 90 tablet 1   No facility-administered medications prior to visit.     Review of Systems;  Patient denies headache, fevers, malaise, unintentional weight loss, skin rash, eye pain, sinus congestion  and sinus pain, sore throat, dysphagia,  hemoptysis , cough, dyspnea, wheezing, chest pain, palpitations, orthopnea, edema, abdominal pain, nausea, melena, diarrhea, constipation, flank pain, dysuria, hematuria, urinary  Frequency, nocturia, numbness, tingling, seizures,  Focal weakness, Loss of consciousness,  Tremor, insomnia, depression, anxiety, and suicidal ideation.      Objective:  BP (!) 144/90 (BP Location: Left Arm, Patient Position: Sitting, Cuff Size: Large)   Pulse 92   Temp 98.1 F (36.7 C) (Oral)   Resp 15   Ht 5\' 6"  (1.676 m)   Wt 231 lb 12.8 oz (105.1 kg)   SpO2 96%   BMI 37.41 kg/m   BP Readings from Last 3 Encounters:  05/18/18 (!) 144/90  02/12/18 (!) 144/78  01/12/18 (!) 142/90    Wt Readings from Last 3 Encounters:  05/18/18 231 lb 12.8 oz (105.1 kg)  02/12/18 236 lb 12 oz (107.4 kg)  01/12/18 247 lb 3.2 oz (112.1 kg)    General appearance: alert, cooperative and appears stated age Ears: normal TM's and external ear canals both ears Throat: lips, mucosa, and tongue normal; teeth and gums normal Neck: no adenopathy, no carotid bruit, supple, symmetrical, trachea midline and thyroid not enlarged, symmetric, no tenderness/mass/nodules Back: symmetric, no curvature. ROM normal. No CVA tenderness. Lungs: clear to auscultation bilaterally Heart: regular rate and rhythm, S1, S2 normal, no murmur,  click, rub or gallop Abdomen: soft, non-tender; bowel sounds normal; no masses,  no organomegaly Pulses: 2+ and symmetric Skin: Skin color, texture, turgor normal. No rashes or lesions Lymph nodes: Cervical, supraclavicular, and axillary nodes normal.  Lab Results  Component Value Date   HGBA1C 6.1 05/12/2018   HGBA1C 6.3 01/12/2018   HGBA1C 5.8 (H) 05/22/2016    Lab Results  Component Value Date   CREATININE 0.68 05/12/2018   CREATININE 0.67 02/10/2018   CREATININE 0.75 01/12/2018    Lab Results  Component Value Date   WBC 7.3 02/10/2018   HGB 12.7  02/10/2018   HCT 38.0 02/10/2018   PLT 302 02/10/2018   GLUCOSE 101 (H) 05/12/2018   CHOL 301 (H) 05/12/2018   TRIG 189.0 (H) 05/12/2018   HDL 44.80 05/12/2018   LDLDIRECT 166.0 01/12/2018   LDLCALC 218 (H) 05/12/2018   ALT 23 05/12/2018   AST 21 05/12/2018   NA 138 05/12/2018   K 4.4 05/12/2018   CL 102 05/12/2018   CREATININE 0.68 05/12/2018   BUN 14 05/12/2018   CO2 28 05/12/2018   TSH 4.06 05/12/2018   HGBA1C 6.1 05/12/2018    No results found.  Assessment & Plan:   Problem List Items Addressed This Visit    Hypothyroidism - Primary    increasing dose to 225 mcg daily for TSH well over 1.0 by adding one extra dose per week   Lab Results  Component Value Date   TSH 4.06 05/12/2018         Relevant Medications   levothyroxine (SYNTHROID, LEVOTHROID) 200 MCG tablet   Other Relevant Orders   TSH   Mixed hyperlipidemia    HDL is has improved,  Triglycerides have dropped to < 200 but LDL was risen slightly. . Advised to substitute more plant proteins for animal proteins into her diet. .   Lab Results  Component Value Date   CHOL 301 (H) 05/12/2018   HDL 44.80 05/12/2018   LDLCALC 218 (H) 05/12/2018   LDLDIRECT 166.0 01/12/2018   TRIG 189.0 (H) 05/12/2018   CHOLHDL 7 05/12/2018            Relevant Orders   Lipid panel   Obesity (BMI 30-39.9)    I have congratulated her in reduction of   BMI and encouraged  Continued weight loss by adding regular participating in exercise       Perimenopause    Discussed symptoms. She declines therapy at this point.       Pre-diabetes    A1c has been lowered to 6.1 with KETO diet .    Lab Results  Component Value Date   HGBA1C 6.1 05/12/2018         Relevant Orders   Hemoglobin A1c   Comprehensive metabolic panel      I have changed Brittany Brooks's ergocalciferol and levothyroxine. I am also having her maintain her multivitamin, ALPRAZolam, metoprolol succinate, Olopatadine HCl, Fish Oil, MAGNESIUM  CITRATE PO, and Calcium Citrate.  Meds ordered this encounter  Medications  . ergocalciferol (DRISDOL) 1.25 MG (50000 UT) capsule    Sig: Take 1 capsule (50,000 Units total) by mouth once a week.    Dispense:  12 capsule    Refill:  0  . levothyroxine (SYNTHROID, LEVOTHROID) 200 MCG tablet    Sig: Take 1 tablet (200 mcg total) by mouth daily before breakfast. 2 on Sundays    Dispense:  102 tablet    Refill:  1  Medications Discontinued During This Encounter  Medication Reason  . ergocalciferol (DRISDOL) 50000 units capsule Reorder  . levothyroxine (SYNTHROID, LEVOTHROID) 200 MCG tablet    A total of 25 minutes of face to face time was spent with patient more than half of which was spent in counselling about the above mentioned conditions  and coordination of care Follow-up: Return in about 6 months (around 11/17/2018).   Sherlene Shams, MD

## 2018-05-19 DIAGNOSIS — N951 Menopausal and female climacteric states: Secondary | ICD-10-CM | POA: Insufficient documentation

## 2018-05-19 MED ORDER — LEVOTHYROXINE SODIUM 200 MCG PO TABS: 200.0000 ug | ORAL_TABLET | Freq: Every day | ORAL | 1 refills | Status: DC

## 2018-05-19 NOTE — Assessment & Plan Note (Addendum)
increasing dose to 225 mcg daily for TSH well over 1.0 by adding one extra dose per week   Lab Results  Component Value Date   TSH 4.06 05/12/2018

## 2018-05-19 NOTE — Assessment & Plan Note (Signed)
HDL is has improved,  Triglycerides have dropped to < 200 but LDL was risen slightly. . Advised to substitute more plant proteins for animal proteins into her diet. .   Lab Results  Component Value Date   CHOL 301 (H) 05/12/2018   HDL 44.80 05/12/2018   LDLCALC 218 (H) 05/12/2018   LDLDIRECT 166.0 01/12/2018   TRIG 189.0 (H) 05/12/2018   CHOLHDL 7 05/12/2018

## 2018-05-19 NOTE — Assessment & Plan Note (Signed)
A1c has been lowered to 6.1 with KETO diet .    Lab Results  Component Value Date   HGBA1C 6.1 05/12/2018

## 2018-05-19 NOTE — Assessment & Plan Note (Signed)
I have congratulated her in reduction of   BMI and encouraged  Continued weight loss by adding regular participating in exercise

## 2018-05-19 NOTE — Assessment & Plan Note (Signed)
Discussed symptoms. She declines therapy at this point.

## 2018-07-16 ENCOUNTER — Ambulatory Visit: Payer: Self-pay | Admitting: Internal Medicine

## 2018-08-16 ENCOUNTER — Other Ambulatory Visit: Payer: Self-pay

## 2018-08-18 ENCOUNTER — Other Ambulatory Visit (INDEPENDENT_AMBULATORY_CARE_PROVIDER_SITE_OTHER): Payer: BLUE CROSS/BLUE SHIELD

## 2018-08-18 ENCOUNTER — Other Ambulatory Visit: Payer: Self-pay

## 2018-08-18 DIAGNOSIS — R7303 Prediabetes: Secondary | ICD-10-CM | POA: Diagnosis not present

## 2018-08-18 DIAGNOSIS — E782 Mixed hyperlipidemia: Secondary | ICD-10-CM

## 2018-08-18 DIAGNOSIS — E039 Hypothyroidism, unspecified: Secondary | ICD-10-CM

## 2018-08-18 LAB — COMPREHENSIVE METABOLIC PANEL
ALBUMIN: 4 g/dL (ref 3.5–5.2)
ALT: 18 U/L (ref 0–35)
AST: 16 U/L (ref 0–37)
Alkaline Phosphatase: 49 U/L (ref 39–117)
BILIRUBIN TOTAL: 0.4 mg/dL (ref 0.2–1.2)
BUN: 15 mg/dL (ref 6–23)
CALCIUM: 9 mg/dL (ref 8.4–10.5)
CO2: 28 meq/L (ref 19–32)
CREATININE: 0.62 mg/dL (ref 0.40–1.20)
Chloride: 102 mEq/L (ref 96–112)
GFR: 101.77 mL/min (ref >60.00)
Glucose, Bld: 94 mg/dL (ref 70–99)
Potassium: 4.4 mEq/L (ref 3.5–5.1)
SODIUM: 136 meq/L (ref 135–145)
Total Protein: 7.1 g/dL (ref 6.0–8.3)

## 2018-08-18 LAB — LIPID PANEL
CHOL/HDL RATIO: 6
CHOLESTEROL: 242 mg/dL — AB (ref 0–200)
HDL: 40.6 mg/dL (ref >39.00)
LDL CALC: 170 mg/dL — AB (ref 0–99)
NONHDL: 201.82
Triglycerides: 160 mg/dL — ABNORMAL HIGH (ref 0.0–149.0)
VLDL: 32 mg/dL (ref 0.0–40.0)

## 2018-08-18 LAB — HEMOGLOBIN A1C: Hgb A1c MFr Bld: 5.8 % (ref 4.6–6.5)

## 2018-08-18 LAB — TSH: TSH: 0.21 u[IU]/mL — AB (ref 0.35–4.50)

## 2018-08-19 ENCOUNTER — Other Ambulatory Visit: Payer: Self-pay | Admitting: Internal Medicine

## 2018-08-19 MED ORDER — LEVOTHYROXINE SODIUM 200 MCG PO TABS: 200.0000 ug | ORAL_TABLET | Freq: Every day | ORAL | 0 refills | Status: DC

## 2018-08-20 ENCOUNTER — Ambulatory Visit: Payer: BLUE CROSS/BLUE SHIELD | Admitting: Internal Medicine

## 2018-08-20 ENCOUNTER — Other Ambulatory Visit: Payer: Self-pay

## 2018-08-20 ENCOUNTER — Encounter: Payer: Self-pay | Admitting: Internal Medicine

## 2018-08-20 VITALS — BP 140/70 | HR 74 | Temp 98.0°F | Wt 220.8 lb

## 2018-08-20 DIAGNOSIS — R7303 Prediabetes: Secondary | ICD-10-CM | POA: Diagnosis not present

## 2018-08-20 DIAGNOSIS — G4733 Obstructive sleep apnea (adult) (pediatric): Secondary | ICD-10-CM

## 2018-08-20 DIAGNOSIS — E669 Obesity, unspecified: Secondary | ICD-10-CM

## 2018-08-20 DIAGNOSIS — I10 Essential (primary) hypertension: Secondary | ICD-10-CM | POA: Diagnosis not present

## 2018-08-20 DIAGNOSIS — E034 Atrophy of thyroid (acquired): Secondary | ICD-10-CM

## 2018-08-20 MED ORDER — PREDNISONE 10 MG PO TABS: ORAL_TABLET | ORAL | 0 refills | Status: DC

## 2018-08-20 NOTE — Assessment & Plan Note (Addendum)
TSH is suppressed.  We will change daily regimen and use THYROID DOSE of 200 MG C 6 DAYS PER WEEK  FOR WEEKLY REDUCTION OF 200 MCG

## 2018-08-20 NOTE — Patient Instructions (Signed)
CONGRATULATIONS!   YOU HAVE MOVED THE NEEDLE WAY BACK!   All of your labs are excellent  We did decide to reduce your weekly dose of thyroid medication to 6 days per week. If you start gaining weight,  Resume the 200 mcg dose 7 days per week and return ofr a tsh in 6 weeks  For your sinus issues:   Add a daily antihistmine  And consider adding  A daily sinus rinse  ( try [employer]'s Sinus rinse ;  It is a strong sinus "flush" using water and medicated salts.  Do it over the sink because it can be a bit messy  Add the prednisone if no change in a few days

## 2018-08-20 NOTE — Progress Notes (Signed)
Subjective:  Patient ID: Brittany Brooks, female    DOB: 24-Apr-1966  Age: 53 y.o. MRN: [id number]  CC: The primary encounter diagnosis was Hypothyroidism due to acquired atrophy of thyroid. Diagnoses of Pre-diabetes, OSA (obstructive sleep apnea), Essential hypertension, and Obesity (BMI 30-39.9) were also pertinent to this visit.  HPI Brittany Brooks presents for follow up on hypothyroidism and prediabetes  Has had chronic congestion and thick mucus since December   Can't seem to clear it  For good.  .  No exposure to the public.  No fevers, works for [employer].  Tried  using steroid nasal spray , mucinex with no change   Recent labs reviewed. Hss not been taking the EXTRA DOSE OF THYROID   Obesity and prediabetes: She has lost 11 lbs in the last 6 weeks by following a low GI diet. A total of 25 minutes of face to face time was spent with patient more than half of which was spent in counselling about the continued benefits of a  low glycemic index diet and regular participation in an aerobic  exercise activities.  Patient was advised to return for repeat an A1c in 6 months.     Outpatient Medications Prior to Visit  Medication Sig Dispense Refill  . ALPRAZolam (XANAX) 0.25 MG tablet TAKE ONE TABLET AT BEDTIME (Patient taking differently: TAKE ONE TABLET AT BEDTIME AS NEEDED.) 30 tablet 2  . Calcium Citrate 200 MG TABS Take 1 tablet by mouth once daily    . ergocalciferol (DRISDOL) 1.25 MG (50000 UT) capsule Take 1 capsule (50,000 Units total) by mouth once a week. 12 capsule 0  . levothyroxine (SYNTHROID, LEVOTHROID) 200 MCG tablet Take 1 tablet (200 mcg total) by mouth daily before breakfast. NOTE REDUCTION IN QUANTITY AND DOSING 90 tablet 0  . MAGNESIUM CITRATE PO Take 1 tablet by mouth three times a day    . metoprolol succinate (TOPROL-XL) 50 MG 24 hr tablet Take 1/2 tablet (25 mg) by mouth once daily- you may take an extra 1/2 tablet (25 mg) by mouth daily as needed for palpitations 30 tablet  6  . Multiple Vitamin (MULTIVITAMIN) tablet Take 1 tablet by mouth daily.    . Olopatadine HCl 0.2 % SOLN   98  . Omega-3 Fatty Acids (FISH OIL) 1000 MG CAPS Take 1 capsule by mouth daily.     No facility-administered medications prior to visit.     Review of Systems;  Patient denies headache, fevers, malaise, unintentional weight loss, skin rash, eye pain, sinus congestion and sinus pain, sore throat, dysphagia,  hemoptysis , cough, dyspnea, wheezing, chest pain, palpitations, orthopnea, edema, abdominal pain, nausea, melena, diarrhea, constipation, flank pain, dysuria, hematuria, urinary  Frequency, nocturia, numbness, tingling, seizures,  Focal weakness, Loss of consciousness,  Tremor, insomnia, depression, anxiety, and suicidal ideation.      Objective:  BP 140/70   Pulse 74   Temp 98 F (36.7 C) (Oral)   Wt 220 lb 12.8 oz (100.2 kg)   LMP 06/18/2018 (Approximate)   SpO2 98%   BMI 35.64 kg/m   BP Readings from Last 3 Encounters:  08/20/18 140/70  05/18/18 (!) 144/90  02/12/18 (!) 144/78    Wt Readings from Last 3 Encounters:  08/20/18 220 lb 12.8 oz (100.2 kg)  05/18/18 231 lb 12.8 oz (105.1 kg)  02/12/18 236 lb 12 oz (107.4 kg)    General appearance: alert, cooperative and appears stated age Ears: normal TM's and external ear canals both ears  Throat: lips, mucosa, and tongue normal; teeth and gums normal Neck: no adenopathy, no carotid bruit, supple, symmetrical, trachea midline and thyroid not enlarged, symmetric, no tenderness/mass/nodules Back: symmetric, no curvature. ROM normal. No CVA tenderness. Lungs: clear to auscultation bilaterally Heart: regular rate and rhythm, S1, S2 normal, no murmur, click, rub or gallop Abdomen: soft, non-tender; bowel sounds normal; no masses,  no organomegaly Pulses: 2+ and symmetric Skin: Skin color, texture, turgor normal. No rashes or lesions Lymph nodes: Cervical, supraclavicular, and axillary nodes normal.  Lab Results   Component Value Date   HGBA1C 5.8 08/18/2018   HGBA1C 6.1 05/12/2018   HGBA1C 6.3 01/12/2018    Lab Results  Component Value Date   CREATININE 0.62 08/18/2018   CREATININE 0.68 05/12/2018   CREATININE 0.67 02/10/2018    Lab Results  Component Value Date   WBC 7.3 02/10/2018   HGB 12.7 02/10/2018   HCT 38.0 02/10/2018   PLT 302 02/10/2018   GLUCOSE 94 08/18/2018   CHOL 242 (H) 08/18/2018   TRIG 160.0 (H) 08/18/2018   HDL 40.60 08/18/2018   LDLDIRECT 166.0 01/12/2018   LDLCALC 170 (H) 08/18/2018   ALT 18 08/18/2018   AST 16 08/18/2018   NA 136 08/18/2018   K 4.4 08/18/2018   CL 102 08/18/2018   CREATININE 0.62 08/18/2018   BUN 15 08/18/2018   CO2 28 08/18/2018   TSH 0.21 (L) 08/18/2018   HGBA1C 5.8 08/18/2018    No results found.  Assessment & Plan:   Problem List Items Addressed This Visit    Pre-diabetes    A1c has been lowered to 5.8  with KETO diet .    Lab Results  Component Value Date   HGBA1C 5.8 08/18/2018         OSA (obstructive sleep apnea)    10/05/15  Study,  Mild ,  Severe with REM accompanied by desats .   Patient is using CPAP every night a minimum of 6 hours per night and notes improved daytime wakefulness and decreased fatigue CPAP titration study has been ordered.       Obesity (BMI 30-39.9)    I have congratulated her in reduction of   BMI and encouraged  Continued weight loss with goal of 10% of body weigh over the next 6 months using a low glycemic index diet and regular exercise a minimum of 5 days per week.        Hypothyroidism - Primary    TSH is suppressed.  We will change daily regimen and use THYROID DOSE of 200 MG C 6 DAYS PER WEEK  FOR WEEKLY REDUCTION OF 200 MCG       Relevant Orders   TSH   Essential hypertension    Well controlled on current regimen. Renal function stable, no changes today.  Lab Results  Component Value Date   CREATININE 0.62 08/18/2018   Lab Results  Component Value Date   NA 136 08/18/2018    K 4.4 08/18/2018   CL 102 08/18/2018   CO2 28 08/18/2018           A total of 25 minutes of face to face time was spent with patient more than half of which was spent in counselling about the above mentioned conditions  and coordination of care   I am having Brittany BraunKaren C. Brooks start on predniSONE. I am also having her maintain her multivitamin, ALPRAZolam, metoprolol succinate, Olopatadine HCl, Fish Oil, MAGNESIUM CITRATE PO, Calcium Citrate, ergocalciferol, and levothyroxine.  Meds ordered this encounter  Medications  . predniSONE (DELTASONE) 10 MG tablet    Sig: 6 tablets on Day 1 , then reduce by 1 tablet daily until gone    Dispense:  21 tablet    Refill:  0    There are no discontinued medications.  Follow-up: Return in about 6 months (around 02/20/2019).   Sherlene Shams, MD

## 2018-08-22 NOTE — Assessment & Plan Note (Signed)
10/05/15  Study,  Mild ,  Severe with REM accompanied by desats .   Patient is using CPAP every night a minimum of 6 hours per night and notes improved daytime wakefulness and decreased fatigue CPAP titration study has been ordered.

## 2018-08-22 NOTE — Assessment & Plan Note (Signed)
A1c has been lowered to 5.8  with KETO diet .    Lab Results  Component Value Date   HGBA1C 5.8 08/18/2018

## 2018-08-22 NOTE — Assessment & Plan Note (Signed)
I have congratulated her in reduction of   BMI and encouraged  Continued weight loss with goal of 10% of body weigh over the next 6 months using a low glycemic index diet and regular exercise a minimum of 5 days per week.    

## 2018-08-22 NOTE — Assessment & Plan Note (Signed)
Well controlled on current regimen. Renal function stable, no changes today.  Lab Results  Component Value Date   CREATININE 0.62 08/18/2018   Lab Results  Component Value Date   NA 136 08/18/2018   K 4.4 08/18/2018   CL 102 08/18/2018   CO2 28 08/18/2018

## 2018-08-25 ENCOUNTER — Other Ambulatory Visit: Payer: Self-pay | Admitting: Internal Medicine

## 2018-10-01 ENCOUNTER — Other Ambulatory Visit: Payer: Self-pay

## 2018-10-22 ENCOUNTER — Other Ambulatory Visit (INDEPENDENT_AMBULATORY_CARE_PROVIDER_SITE_OTHER): Payer: BLUE CROSS/BLUE SHIELD

## 2018-10-22 ENCOUNTER — Other Ambulatory Visit: Payer: Self-pay | Admitting: Internal Medicine

## 2018-10-22 ENCOUNTER — Other Ambulatory Visit: Payer: Self-pay

## 2018-10-22 DIAGNOSIS — E034 Atrophy of thyroid (acquired): Secondary | ICD-10-CM | POA: Diagnosis not present

## 2018-10-22 LAB — TSH: TSH: 0.04 u[IU]/mL — ABNORMAL LOW (ref 0.35–4.50)

## 2018-10-22 MED ORDER — LEVOTHYROXINE SODIUM 175 MCG PO TABS: 175.0000 ug | ORAL_TABLET | Freq: Every day | ORAL | 1 refills | Status: DC

## 2018-10-22 NOTE — Assessment & Plan Note (Signed)
TSH more  suppressed despite  Reducing dose to 200 mcg 6 days per week in March. .  Reduce dose to 175 mcg daily

## 2018-12-15 ENCOUNTER — Telehealth: Payer: Self-pay

## 2018-12-15 NOTE — Telephone Encounter (Signed)
Coronavirus (COVID-19) Are you at risk?  Are you at risk for the Coronavirus (COVID-19)?  To be considered HIGH RISK for Coronavirus (COVID-19), you have to meet the following criteria:  . Traveled to [place], [place], [name], [place] or [place]; or in the [place] to [place], [place], [place], or [place]; and have fever, cough, and shortness of breath within the last 2 weeks of travel OR . Been in close contact with a person diagnosed with COVID-19 within the last 2 weeks and have fever, cough, and shortness of breath . IF YOU DO NOT MEET THESE CRITERIA, YOU ARE CONSIDERED LOW RISK FOR COVID-19.  What to do if you are HIGH RISK for COVID-19?  . If you are having a medical emergency, call 911. . Seek medical care right away. Before you go to a doctor's office, urgent care or emergency department, call ahead and tell them about your recent travel, contact with someone diagnosed with COVID-19, and your symptoms. You should receive instructions from your physician's office regarding next steps of care.  . When you arrive at healthcare provider, tell the healthcare staff immediately you have returned from visiting [place], [place], [place], [place] or [name]; or traveled in the [place] to [place], [place], [place], or [place]; in the last two weeks or you have been in close contact with a person diagnosed with COVID-19 in the last 2 weeks.   . Tell the health care staff about your symptoms: fever, cough and shortness of breath. . After you have been seen by a medical provider, you will be either: o Tested for (COVID-19) and discharged home on quarantine except to seek medical care if symptoms worsen, and asked to  - Stay home and avoid contact with others until you get your results (4-5 days)  - Avoid travel on public transportation if possible (such as bus, train, or airplane) or o Sent to the Emergency Department by EMS for evaluation, COVID-19 testing, and possible  admission depending on your condition and test results.  What to do if you are LOW RISK for COVID-19?  Reduce your risk of any infection by using the same precautions used for avoiding the common cold or flu:  . Wash your hands often with soap and warm water for at least 20 seconds.  If soap and water are not readily available, use an alcohol-based hand sanitizer with at least 60% alcohol.  . If coughing or sneezing, cover your mouth and nose by coughing or sneezing into the elbow areas of your shirt or coat, into a tissue or into your sleeve (not your hands). . Avoid shaking hands with others and consider head nods or verbal greetings only. . Avoid touching your eyes, nose, or mouth with unwashed hands.  . Avoid close contact with people who are Richardo Popoff. . Avoid places or events with large numbers of people in one location, like concerts or sporting events. . Carefully consider travel plans you have or are making. . If you are planning any travel outside or inside the [place], visit the CDC's Travelers' Health webpage for the latest health notices. . If you have some symptoms but not all symptoms, continue to monitor at home and seek medical attention if your symptoms worsen. . If you are having a medical emergency, call 911.  12/15/18 SCREENING [facility] ADDITIONAL HEALTHCARE OPTIONS FOR PATIENTS  [facility] / e-Visit: [email or web address]         [facility]: [phone]    [facility]: [phone]                   [facility]: [phone]  

## 2018-12-16 ENCOUNTER — Other Ambulatory Visit: Payer: Self-pay

## 2018-12-16 ENCOUNTER — Ambulatory Visit (INDEPENDENT_AMBULATORY_CARE_PROVIDER_SITE_OTHER): Payer: BC Managed Care – PPO | Admitting: Obstetrics and Gynecology

## 2018-12-16 ENCOUNTER — Other Ambulatory Visit (INDEPENDENT_AMBULATORY_CARE_PROVIDER_SITE_OTHER): Payer: BC Managed Care – PPO

## 2018-12-16 ENCOUNTER — Encounter: Payer: Self-pay | Admitting: Obstetrics and Gynecology

## 2018-12-16 VITALS — BP 140/84 | HR 72 | Ht 66.0 in | Wt 209.1 lb

## 2018-12-16 DIAGNOSIS — N951 Menopausal and female climacteric states: Secondary | ICD-10-CM

## 2018-12-16 DIAGNOSIS — E034 Atrophy of thyroid (acquired): Secondary | ICD-10-CM

## 2018-12-16 DIAGNOSIS — N926 Irregular menstruation, unspecified: Secondary | ICD-10-CM

## 2018-12-16 DIAGNOSIS — Z6833 Body mass index (BMI) 33.0-33.9, adult: Secondary | ICD-10-CM | POA: Diagnosis not present

## 2018-12-16 DIAGNOSIS — Z01419 Encounter for gynecological examination (general) (routine) without abnormal findings: Secondary | ICD-10-CM

## 2018-12-16 NOTE — Progress Notes (Signed)
Subjective:   Brittany Brooks is a 53 y.o. G32P1001 Caucasian female here for a routine well-woman exam.  Patient's last menstrual period was 12/12/2018 (exact date).    Current complaints: none PCP: [facility]       Patient does not desire labs  Reports irregular periods in timing, varying menses heaviness, and hot flashes. Pt states that the hot flashes are tolerable. Pt says she is keeping track of period as it is irregular and she is pre-menopausal. Pt on Keto diet with daily exercise - 40lb weight loss. Working from home since [employer], transitioned well. Denies sexual activity at this time.  Social History: Sexual: heterosexual Marital Status: married Living situation: with spouse Occupation: Civil Service fast streamer to [employer] Tobacco/alcohol: no tobacco use, occ wine Illicit drugs: no history of illicit drug use  The following portions of the patient's history were reviewed and updated as appropriate: allergies, current medications, past family history, past medical history, past social history, past surgical history and problem list.  Past Medical History Past Medical History:  Diagnosis Date  . Diverticulitis   . Dysrhythmia   . Hyperlipidemia   . Hypertension   . Hypothyroidism    since early 20's, secondary to Hashimoto's  . Multinodular goiter (nontoxic)    biopsy negativre by [facility]  . Pancreatitis 2000   secondary to cholangiogram during lap chole, Sankar  . Sleep apnea     Past Surgical History Past Surgical History:  Procedure Laterality Date  . CARDIAC CATHETERIZATION N/A 08/08/2015   Procedure: Left Heart Cath and Coronary Angiography;  Surgeon: Teodoro Spray, MD;  Location: [facility];  Service: Cardiovascular;  Laterality: N/A;  . CESAREAN SECTION  Sept 2000  . CHOLECYSTECTOMY  2000   recurrent cholecystitis during pregnancy    Gynecologic History G1P1001  Patient's last menstrual period was 12/12/2018 (exact date). Contraception: none Last Pap:  12/2017. Results were: normal Last mammogram: 10/2017. Results were: normal   Obstetric History OB History  Gravida Para Term Preterm AB Living  1 1 1     1   SAB TAB Ectopic Multiple Live Births          1    # Outcome Date GA Lbr Len/2nd Weight Sex Delivery Anes PTL Lv  1 Term 02/28/99 [redacted]w[redacted]d  8 lb 10 oz (3.912 kg) M CS-Unspec  N LIV    Current Medications Current Outpatient Medications on File Prior to Visit  Medication Sig Dispense Refill  . ALPRAZolam (XANAX) 0.25 MG tablet TAKE ONE TABLET AT BEDTIME (Patient taking differently: TAKE ONE TABLET AT BEDTIME AS NEEDED.) 30 tablet 2  . Calcium Citrate 200 MG TABS Take 1 tablet by mouth once daily    . ergocalciferol (DRISDOL) 1.25 MG (50000 UT) capsule Take 1 capsule (50,000 Units total) by mouth once a week. 12 capsule 0  . levothyroxine (SYNTHROID) 175 MCG tablet Take 1 tablet (175 mcg total) by mouth daily before breakfast. NOTE REDUCTION IN QUANTITY AND DOSING 90 tablet 1  . MAGNESIUM CITRATE PO Take 1 tablet by mouth three times a day    . metoprolol succinate (TOPROL-XL) 50 MG 24 hr tablet TAKE 1/2 TABLET BY MOUTH ONCE DAILY- MAYTAKE AN EXTRA 1/2 TABLET BY MOUTH AS NEEDED FOR PALPITATIONS 30 tablet 0  . Multiple Vitamin (MULTIVITAMIN) tablet Take 1 tablet by mouth daily.    . Olopatadine HCl 0.2 % SOLN   98  . Omega-3 Fatty Acids (FISH OIL) 1000 MG CAPS Take 1 capsule by mouth daily.    Marland Kitchen  predniSONE (DELTASONE) 10 MG tablet 6 tablets on Day 1 , then reduce by 1 tablet daily until gone 21 tablet 0   No current facility-administered medications on file prior to visit.     Review of Systems Patient denies any headaches, blurred vision, shortness of breath, chest pain, abdominal pain, problems with bowel movements, or urination.  Objective:  BP 140/84   Pulse 72   Ht 5\' 6"  (1.676 m)   Wt 209 lb 2 oz (94.9 kg)   LMP 12/12/2018 (Exact Date)   BMI 33.75 kg/m  Physical Exam  General:  Well developed, well nourished, no acute  distress. She is alert and oriented x3. Skin:  Warm and dry Neck:  Midline trachea, no thyromegaly or nodules Cardiovascular: Regular rate and rhythm, no murmur heard Lungs:  Effort normal, all lung fields clear to auscultation bilaterally Breasts:  No dominant palpable mass, retraction, or nipple discharge Abdomen:  Soft, non tender, no hepatosplenomegaly or masses Pelvic:  External genitalia is normal in appearance.  The vagina is normal in appearance. The cervix is bulbous, no CMT.  Thin prep pap is not done. Uterus is felt to be normal size, shape, and contour.  No adnexal masses or tenderness noted. Extremities:  No swelling or varicosities noted Psych:  She has a normal mood and affect  Assessment:   Healthy well-woman exam Hypothyroid BMI 33  Plan:  F/U 1 year for AE, or sooner if needed Congratulated on weight loss. Mammogram ordered Colonoscopy due, pt reports ordered by GI Dr Smith MinceWahl- needs to schedule.  Brittany Brooks FusBethann Brooks, SNM  Suzan NailerN , CNM

## 2018-12-17 LAB — T4, FREE: Free T4: 1.6 ng/dL (ref 0.60–1.60)

## 2018-12-17 LAB — TSH: TSH: 0.02 u[IU]/mL — ABNORMAL LOW (ref 0.35–4.50)

## 2018-12-19 ENCOUNTER — Other Ambulatory Visit: Payer: Self-pay | Admitting: Internal Medicine

## 2018-12-19 DIAGNOSIS — E034 Atrophy of thyroid (acquired): Secondary | ICD-10-CM

## 2018-12-19 MED ORDER — LEVOTHYROXINE SODIUM 150 MCG PO TABS: 150.0000 ug | ORAL_TABLET | Freq: Every day | ORAL | 0 refills | Status: DC

## 2018-12-21 ENCOUNTER — Other Ambulatory Visit: Payer: Self-pay | Admitting: Internal Medicine

## 2018-12-27 ENCOUNTER — Other Ambulatory Visit (INDEPENDENT_AMBULATORY_CARE_PROVIDER_SITE_OTHER): Payer: BC Managed Care – PPO

## 2018-12-27 ENCOUNTER — Other Ambulatory Visit: Payer: Self-pay

## 2018-12-27 DIAGNOSIS — E034 Atrophy of thyroid (acquired): Secondary | ICD-10-CM

## 2018-12-28 ENCOUNTER — Other Ambulatory Visit: Payer: Self-pay | Admitting: Internal Medicine

## 2018-12-28 LAB — TSH: TSH: 0.08 u[IU]/mL — ABNORMAL LOW (ref 0.35–4.50)

## 2019-01-12 DIAGNOSIS — I10 Essential (primary) hypertension: Secondary | ICD-10-CM | POA: Diagnosis not present

## 2019-01-12 DIAGNOSIS — R110 Nausea: Secondary | ICD-10-CM | POA: Diagnosis not present

## 2019-01-12 DIAGNOSIS — E034 Atrophy of thyroid (acquired): Secondary | ICD-10-CM

## 2019-01-13 MED ORDER — LEVOTHYROXINE SODIUM 112 MCG PO TABS: 112.0000 ug | ORAL_TABLET | Freq: Every day | ORAL | 3 refills | Status: DC

## 2019-01-13 NOTE — Assessment & Plan Note (Signed)
tsh again suppressed on 150 mcg and she is very symptomatic.  Lowering dose to 112 mcg.

## 2019-01-14 ENCOUNTER — Other Ambulatory Visit (INDEPENDENT_AMBULATORY_CARE_PROVIDER_SITE_OTHER): Payer: BC Managed Care – PPO

## 2019-01-14 ENCOUNTER — Other Ambulatory Visit: Payer: Self-pay

## 2019-01-14 DIAGNOSIS — E034 Atrophy of thyroid (acquired): Secondary | ICD-10-CM

## 2019-01-14 NOTE — Telephone Encounter (Signed)
Spoke with pt and she stated that she came in today to have the correct labs drawn and that everything should be okay now.

## 2019-01-14 NOTE — Addendum Note (Signed)
Addended by: WRIGHT, LATOYA S on: 01/14/2019 02:47 PM   Modules accepted: Orders  

## 2019-01-14 NOTE — Addendum Note (Signed)
Addended by: Leeanne Rio on: 01/14/2019 04:00 PM   Modules accepted: Orders

## 2019-01-15 LAB — TSH: TSH: 2.33 u[IU]/mL (ref 0.450–4.500)

## 2019-01-15 LAB — THYROID PEROXIDASE ANTIBODY: Thyroperoxidase Ab SerPl-aCnc: 154 IU/mL — ABNORMAL HIGH (ref 0–34)

## 2019-01-15 LAB — T4, FREE: Free T4: 1 ng/dL (ref 0.82–1.77)

## 2019-01-15 LAB — THYROID STIMULATING IMMUNOGLOBULIN: Thyroid Stim Immunoglobulin: <0.1 IU/L (ref 0.00–0.55)

## 2019-01-17 ENCOUNTER — Other Ambulatory Visit: Payer: Self-pay | Admitting: Internal Medicine

## 2019-01-17 ENCOUNTER — Telehealth: Payer: Self-pay

## 2019-01-17 DIAGNOSIS — E038 Other specified hypothyroidism: Secondary | ICD-10-CM

## 2019-01-17 DIAGNOSIS — E063 Autoimmune thyroiditis: Secondary | ICD-10-CM

## 2019-01-17 NOTE — Telephone Encounter (Signed)
Copied from [place] [id number]. Topic: Referral - Question >> Jan 17, 2019  9:12 AM Rayann Heman wrote: Reason for CRM: pt called and stated that she called about referral for endo. Endo has not received referral. Pt is upset on the phone and is worried. Can we please resent referral. Please advise

## 2019-01-17 NOTE — Telephone Encounter (Signed)
Spoke with pt to let her know that the referral was sent over to [place] around 11:30 today.

## 2019-01-17 NOTE — Telephone Encounter (Signed)
Can you check on this referral. The pt is concerned because she does not want to be without medication for very long. The pt stated that she called over to [place] this morning to schedule and they told her that if we went ahead and sent the referral over they could get her scheduled this week or next week.

## 2019-01-17 NOTE — Telephone Encounter (Signed)
It was just put in this morning. I've went ahead and sent to [facility]. Thanks [employer]

## 2019-01-17 NOTE — Assessment & Plan Note (Signed)
Currently hyperthyroid despite reduction in dose.  Suspending levothyroxine for now , endocrine consult pending to [facility]

## 2019-01-20 DIAGNOSIS — E669 Obesity, unspecified: Secondary | ICD-10-CM | POA: Diagnosis not present

## 2019-01-20 DIAGNOSIS — Z8639 Personal history of other endocrine, nutritional and metabolic disease: Secondary | ICD-10-CM | POA: Diagnosis not present

## 2019-01-20 DIAGNOSIS — E063 Autoimmune thyroiditis: Secondary | ICD-10-CM | POA: Diagnosis not present

## 2019-01-28 DIAGNOSIS — Z8639 Personal history of other endocrine, nutritional and metabolic disease: Secondary | ICD-10-CM | POA: Diagnosis not present

## 2019-02-21 DIAGNOSIS — R0789 Other chest pain: Secondary | ICD-10-CM | POA: Diagnosis not present

## 2019-02-21 DIAGNOSIS — E063 Autoimmune thyroiditis: Secondary | ICD-10-CM | POA: Diagnosis not present

## 2019-04-06 ENCOUNTER — Other Ambulatory Visit: Payer: Self-pay

## 2019-04-06 ENCOUNTER — Ambulatory Visit
Admission: RE | Admit: 2019-04-06 | Discharge: 2019-04-06 | Disposition: A | Discharge status: Home / Self Care | Payer: BC Managed Care – PPO | Source: Ambulatory Visit | Attending: Obstetrics and Gynecology | Admitting: Obstetrics and Gynecology

## 2019-04-06 DIAGNOSIS — Z1231 Encounter for screening mammogram for malignant neoplasm of breast: Secondary | ICD-10-CM | POA: Diagnosis not present

## 2019-04-06 DIAGNOSIS — Z01419 Encounter for gynecological examination (general) (routine) without abnormal findings: Secondary | ICD-10-CM

## 2019-06-24 DIAGNOSIS — E063 Autoimmune thyroiditis: Secondary | ICD-10-CM | POA: Diagnosis not present

## 2019-06-30 DIAGNOSIS — E063 Autoimmune thyroiditis: Secondary | ICD-10-CM | POA: Diagnosis not present

## 2019-08-11 DIAGNOSIS — E063 Autoimmune thyroiditis: Secondary | ICD-10-CM | POA: Diagnosis not present

## 2019-09-29 DIAGNOSIS — E063 Autoimmune thyroiditis: Secondary | ICD-10-CM | POA: Diagnosis not present

## 2019-11-23 DIAGNOSIS — E063 Autoimmune thyroiditis: Secondary | ICD-10-CM | POA: Diagnosis not present

## 2019-12-08 DIAGNOSIS — H00011 Hordeolum externum right upper eyelid: Secondary | ICD-10-CM | POA: Diagnosis not present

## 2020-01-16 DIAGNOSIS — L573 Poikiloderma of Civatte: Secondary | ICD-10-CM | POA: Diagnosis not present

## 2020-01-16 DIAGNOSIS — K651 Peritoneal abscess: Secondary | ICD-10-CM | POA: Diagnosis not present

## 2020-02-17 ENCOUNTER — Other Ambulatory Visit: Payer: Self-pay | Admitting: Hospice and Palliative Medicine

## 2020-02-17 ENCOUNTER — Encounter: Payer: Self-pay | Admitting: Hospice and Palliative Medicine

## 2020-02-17 ENCOUNTER — Other Ambulatory Visit: Payer: Self-pay

## 2020-02-17 ENCOUNTER — Encounter: Payer: Self-pay | Admitting: Internal Medicine

## 2020-02-17 ENCOUNTER — Telehealth (INDEPENDENT_AMBULATORY_CARE_PROVIDER_SITE_OTHER): Payer: BC Managed Care – PPO | Admitting: Internal Medicine

## 2020-02-17 DIAGNOSIS — U071 COVID-19: Secondary | ICD-10-CM | POA: Diagnosis not present

## 2020-02-17 MED ORDER — CHERATUSSIN AC 100-10 MG/5ML PO SOLN: 5.0000 mL | Freq: Three times a day (TID) | ORAL | 0 refills | Status: DC | PRN

## 2020-02-17 MED ORDER — PREDNISONE 10 MG PO TABS: ORAL_TABLET | ORAL | 0 refills | Status: DC

## 2020-02-17 MED ORDER — PANTOPRAZOLE SODIUM 40 MG PO TBEC: 40.0000 mg | DELAYED_RELEASE_TABLET | Freq: Every day | ORAL | 0 refills | Status: DC

## 2020-02-17 NOTE — Progress Notes (Signed)
Virtual Visit via [employer]  This visit type was conducted due to national recommendations for restrictions regarding the COVID-19 pandemic (e.g. social distancing).  This format is felt to be most appropriate for this patient at this time.  All issues noted in this document were discussed and addressed.  No physical exam was performed (except for noted visual exam findings with Video Visits).   I connected with@ on 02/17/20 at 11:30 AM EDT by a video enabled telemedicine application  and verified that I am speaking with the correct person using two identifiers. Location patient: home Location provider: work or home office Persons participating in the virtual visit: patient, provider  I discussed the limitations, risks, security and privacy concerns of performing an evaluation and management service by telephone and the availability of in person appointments. I also discussed with the patient that there may be a patient responsible charge related to this service. The patient expressed understanding and agreed to proceed.  Reason for visit: positive COVID TEST ON SEPT 2   HPI: 54 YR OLD female with PMH of obesity, OSA, hypertension and hashimoto's Disease , no history of COVID 19 vaccination presents with symptomatic POSITIVE HOME COVID TEST ON Thursday Sept 2.  Symptoms include body aches, fever,  Anosmia dysgeusia, anorexia,  diarrhea , productive cough and profound fatigue.  She continues to have fevers to 102 despite taking 1000 mg tylenol and 400 mg ibuprofen every 4 to 6 hours for the past week . Does not own a pulse oximeter, but has one ordered through [employer] with receipt scheduled tomorrow.  Husband also tested positive and had an E Visit for treatment of sinusitis Patrecia Pace).  She notes burning in her chest and nasal passages, aggravated by deep breathing , and the cough is also brought on by deep breathing.       ROS: See pertinent positives and negatives per HPI.  Past  Medical History:  Diagnosis Date  . Diverticulitis   . Dysrhythmia   . Hyperlipidemia   . Hypertension   . Hypothyroidism    since early 20's, secondary to Hashimoto's  . Multinodular goiter (nontoxic)    biopsy negativre by [facility]  . Pancreatitis 2000   secondary to cholangiogram during lap chole, Sankar  . Sleep apnea     Past Surgical History:  Procedure Laterality Date  . CARDIAC CATHETERIZATION N/A 08/08/2015   Procedure: Left Heart Cath and Coronary Angiography;  Surgeon: Dalia Heading, MD;  Location: [facility];  Service: Cardiovascular;  Laterality: N/A;  . CESAREAN SECTION  Sept 2000  . CHOLECYSTECTOMY  2000   recurrent cholecystitis during pregnancy    Family History  Problem Relation Age of Onset  . Hyperlipidemia Mother   . Hypertension Mother   . COPD Mother   . Hyperlipidemia Father   . Heart disease Father 27       CABG x 4  . Diabetes Father   . Stroke Father        TIA  . COPD Father   . Cancer Neg Hx   . Breast cancer Neg Hx     SOCIAL HX:  reports that she has never smoked. She has never used smokeless tobacco. She reports current alcohol use of about 4.0 standard drinks of alcohol per week. She reports that she does not use drugs.   Current Outpatient Medications:  .  ALPRAZolam (XANAX) 0.25 MG tablet, TAKE ONE TABLET AT BEDTIME (Patient taking differently: TAKE ONE TABLET AT BEDTIME AS  NEEDED.), Disp: 30 tablet, Rfl: 2 .  Calcium Citrate 200 MG TABS, Take 1 tablet by mouth once daily, Disp: , Rfl:  .  levothyroxine (SYNTHROID) 112 MCG tablet, Take 1 tablet (112 mcg total) by mouth daily., Disp: 90 tablet, Rfl: 3 .  MAGNESIUM CITRATE PO, Take 1 tablet by mouth three times a day, Disp: , Rfl:  .  metoprolol succinate (TOPROL-XL) 50 MG 24 hr tablet, TAKE 1/2 TABLET BY MOUTH ONCE DAILY- MAYTAKE AN EXTRA 1/2 TABLET BY MOUTH AS NEEDED FOR PALPITATIONS, Disp: 30 tablet, Rfl: 0 .  Multiple Vitamin (MULTIVITAMIN) tablet, Take 1 tablet by mouth  daily., Disp: , Rfl:  .  Olopatadine HCl 0.2 % SOLN, , Disp: , Rfl: 98 .  Omega-3 Fatty Acids (FISH OIL) 1000 MG CAPS, Take 1 capsule by mouth daily., Disp: , Rfl:  .  guaiFENesin-codeine (CHERATUSSIN AC) 100-10 MG/5ML syrup, Take 5 mLs by mouth 3 (three) times daily as needed for cough., Disp: 120 mL, Rfl: 0 .  pantoprazole (PROTONIX) 40 MG tablet, Take 1 tablet (40 mg total) by mouth daily., Disp: 30 tablet, Rfl: 0 .  predniSONE (DELTASONE) 10 MG tablet, 6 tablets on Day 1 , then reduce by 1 tablet daily until gone, Disp: 21 tablet, Rfl: 0  EXAM:  VITALS per patient if applicable:  GENERAL: alert, oriented, appears sicke but  in no acute distress  HEENT: atraumatic, conjunttiva clear, no obvious abnormalities on inspection of external nose and ears  NECK: normal movements of the head and neck  LUNGS: on inspection no signs of respiratory distress, breathing rate appears normal, no obvious gross SOB, gasping or wheezing.  Occasional dry cough   CV: no obvious cyanosis  MS: moves all visible extremities without noticeable abnormality  PSYCH/NEURO: pleasant and cooperative, no obvious depression or anxiety, speech and thought processing grossly intact  ASSESSMENT AND PLAN:  Discussed the following assessment and plan:  COVID-19 virus infection - Plan: SARS-CoV-2 Semi-Quantitative Total Antibody, Spike  COVID-19 virus infection She has no history of vaccination.  Has been working from home since pandemic started.  Patient has persistent symptoms since Sept 2.  She has developed gastritis and is taking excessive amounts of tylenol (>4000 mg daily for the past week).  Will stop ibuprofen and tylenol.  Prednisone taper, cheratussin, and protonix prescribed.  Referral for monoclonal antibody infusion to be made     I discussed the assessment and treatment plan with the patient. The patient was provided an opportunity to ask questions and all were answered. The patient agreed with the  plan and demonstrated an understanding of the instructions.   The patient was advised to call back or seek an in-person evaluation if the symptoms worsen or if the condition fails to improve as anticipated.  I provided 30 minutes of face-to-face time during this encounter.   Sherlene Shams, MD

## 2020-02-17 NOTE — Progress Notes (Signed)
I connected by phone with patient on 02/17/2020 to discuss the potential use of an new treatment for mild to moderate COVID-19 viral infection in non-hospitalized patients.   This patient is a age/sex that meets the FDA criteria for Emergency Use Authorization of casirivimab\imdevimab.  Has a (+) direct SARS-CoV-2 viral test result 1. Has mild or moderate COVID-19  2. Is ? 52 years of age and weighs ? 40 kg 3. Is NOT hospitalized due to COVID-19 4. Is NOT requiring oxygen therapy or requiring an increase in baseline oxygen flow rate due to COVID-19 5. Is within 10 days of symptom onset 6. Has at least one of the high risk factor(s) for progression to severe COVID-19 and/or hospitalization as defined in EUA. ? Specific high risk criteria : Age, respiratory dz, HTN   Symptom onset  9/3   I have spoken and communicated the following to the patient or parent/caregiver:   1. FDA has authorized the emergency use of casirivimab\imdevimab for the treatment of mild to moderate COVID-19 in adults and pediatric patients with positive results of direct SARS-CoV-2 viral testing who are 67 years of age and older weighing at least 40 kg, and who are at high risk for progressing to severe COVID-19 and/or hospitalization.   2. The significant known and potential risks and benefits of casirivimab\imdevimab, and the extent to which such potential risks and benefits are unknown.   3. Information on available alternative treatments and the risks and benefits of those alternatives, including clinical trials.   4. Patients treated with casirivimab\imdevimab should continue to self-isolate and use infection control measures (e.g., wear mask, isolate, social distance, avoid sharing personal items, clean and disinfect "high touch" surfaces, and frequent handwashing) according to CDC guidelines.    5. The patient or parent/caregiver has the option to accept or refuse casirivimab\imdevimab .   After reviewing this  information with the patient, The patient agreed to proceed with receiving casirivimab\imdevimab infusion and will be provided a copy of the Fact sheet prior to receiving the infusion.Laurette Schimke, PhD, NP-C [phone] (Infusion Center Hotline)

## 2020-02-17 NOTE — Assessment & Plan Note (Addendum)
She has no history of vaccination.  Has been working from home since pandemic started.  Patient has persistent symptoms since Sept 2.  She has developed gastritis and is taking excessive amounts of tylenol (>4000 mg daily for the past week).  Will stop ibuprofen and tylenol.  Prednisone taper, cheratussin, and protonix prescribed.  Referral for monoclonal antibody infusion to be made

## 2020-02-18 ENCOUNTER — Ambulatory Visit (HOSPITAL_COMMUNITY)
Admission: RE | Admit: 2020-02-18 | Discharge: 2020-02-18 | Disposition: A | Discharge status: Home / Self Care | Payer: BC Managed Care – PPO | Source: Ambulatory Visit | Attending: Pulmonary Disease | Admitting: Pulmonary Disease

## 2020-02-18 DIAGNOSIS — U071 COVID-19: Secondary | ICD-10-CM | POA: Insufficient documentation

## 2020-02-18 MED ORDER — ALBUTEROL SULFATE HFA 108 (90 BASE) MCG/ACT IN AERS: 2.0000 | INHALATION_SPRAY | Freq: Once | RESPIRATORY_TRACT | Status: DC | PRN

## 2020-02-18 MED ORDER — DIPHENHYDRAMINE HCL 50 MG/ML IJ SOLN: 50.0000 mg | Freq: Once | INTRAMUSCULAR | Status: DC | PRN

## 2020-02-18 MED ORDER — METHYLPREDNISOLONE SODIUM SUCC 125 MG IJ SOLR: 125.0000 mg | Freq: Once | INTRAMUSCULAR | Status: DC | PRN

## 2020-02-18 MED ORDER — FAMOTIDINE IN NACL 20-0.9 MG/50ML-% IV SOLN: 20.0000 mg | Freq: Once | INTRAVENOUS | Status: DC | PRN

## 2020-02-18 MED ORDER — SODIUM CHLORIDE 0.9 % IV SOLN: INTRAVENOUS | Status: DC | PRN

## 2020-02-18 MED ORDER — SODIUM CHLORIDE 0.9 % IV SOLN
1200.0000 mg | Freq: Once | INTRAVENOUS | Status: AC
  Administered 2020-02-18: 1200 mg via INTRAVENOUS
  Filled 2020-02-18: qty 10

## 2020-02-18 MED ORDER — EPINEPHRINE 0.3 MG/0.3ML IJ SOAJ: 0.3000 mg | Freq: Once | INTRAMUSCULAR | Status: DC | PRN

## 2020-02-18 NOTE — Discharge Instructions (Signed)

## 2020-02-18 NOTE — Progress Notes (Signed)
  Diagnosis: COVID-19  Physician: Dr. Wright  Procedure: [facility]fusion [facility] Med: casirivimab\imdevimab infusion - Provided patient with casirivimab\imdevimab fact sheet for patients, parents and caregivers prior to infusion.  Complications: No immediate complications noted.  Discharge: Discharged home   Brittany Brooks M Brittany Brooks 02/18/2020  

## 2020-02-20 NOTE — Telephone Encounter (Signed)
Pt wants to know how long she needs to go before she need to go to ER with oxygen levels below 90? She said it just went back up to 93 but she just need to know what is too low?

## 2020-02-21 ENCOUNTER — Telehealth: Payer: Self-pay

## 2020-02-21 ENCOUNTER — Other Ambulatory Visit: Payer: Self-pay | Admitting: Internal Medicine

## 2020-02-21 DIAGNOSIS — U071 COVID-19: Secondary | ICD-10-CM

## 2020-02-21 MED ORDER — ALBUTEROL SULFATE HFA 108 (90 BASE) MCG/ACT IN AERS: 2.0000 | INHALATION_SPRAY | Freq: Four times a day (QID) | RESPIRATORY_TRACT | 11 refills | Status: DC | PRN

## 2020-02-21 MED ORDER — HYDROCOD POLST-CPM POLST ER 10-8 MG/5ML PO SUER: 5.0000 mL | Freq: Every evening | ORAL | 0 refills | Status: DC | PRN

## 2020-02-21 MED ORDER — LEVALBUTEROL HCL 0.63 MG/3ML IN NEBU: 0.6300 mg | INHALATION_SOLUTION | RESPIRATORY_TRACT | 0 refills | Status: DC | PRN

## 2020-02-21 NOTE — Telephone Encounter (Signed)
Patient sent a request for an appointment through mychart. Patient is complaining of tightness in chest, coughing and pain.

## 2020-02-21 NOTE — Telephone Encounter (Signed)
YES I HAVE SENT THE XOPENEX AND A NEBULIZER .  IT IS NOT AFFORDABLE AS A METERED DOSE INHALER  SEND THE NEBULIZER  TO  [facility] AND CANCEL THE ALBUTEROL

## 2020-02-21 NOTE — Telephone Encounter (Signed)
Spoke with pt to let her know of the medication that was sent in. Called [facility] and canceled albuterol rx.

## 2020-02-21 NOTE — Telephone Encounter (Signed)
Spoke with pt in regards to her [employer]. Pt stated that she did not want a virtual visit she stated that she would like to be seen in person because she felt like she needed to have her chest listened to. Pt was advised that she could go to UC or our respiratory clinic in [place]. Pt stated that she did not want to go to [facility] and would go to [facility] if she felt it to be necessary. Pt also wanted to know if there was a different inhaler or nebulizer that could be sent in instead of the albuterol. She stated that she has issues with tachycardia already.

## 2020-02-21 NOTE — Telephone Encounter (Signed)
See telephone encounter.

## 2020-03-08 ENCOUNTER — Telehealth: Payer: BC Managed Care – PPO | Admitting: Internal Medicine

## 2020-05-21 ENCOUNTER — Other Ambulatory Visit: Payer: Self-pay | Admitting: Internal Medicine

## 2020-05-21 DIAGNOSIS — Z1231 Encounter for screening mammogram for malignant neoplasm of breast: Secondary | ICD-10-CM

## 2020-05-23 DIAGNOSIS — E063 Autoimmune thyroiditis: Secondary | ICD-10-CM | POA: Diagnosis not present

## 2020-07-30 ENCOUNTER — Telehealth: Payer: Self-pay | Admitting: Internal Medicine

## 2020-07-30 DIAGNOSIS — R7303 Prediabetes: Secondary | ICD-10-CM

## 2020-07-30 DIAGNOSIS — K5792 Diverticulitis of intestine, part unspecified, without perforation or abscess without bleeding: Secondary | ICD-10-CM

## 2020-07-30 DIAGNOSIS — E782 Mixed hyperlipidemia: Secondary | ICD-10-CM

## 2020-07-30 DIAGNOSIS — E042 Nontoxic multinodular goiter: Secondary | ICD-10-CM

## 2020-07-30 NOTE — Telephone Encounter (Addendum)
Patient called in wanted to a lab order before her appointment on  08-10-20 1:30

## 2020-07-31 NOTE — Telephone Encounter (Signed)
Which labs would you like ordered for the patient? 

## 2020-07-31 NOTE — Telephone Encounter (Signed)
FASTING LABS ORDERED.  

## 2020-08-06 ENCOUNTER — Encounter: Payer: Self-pay | Admitting: Family

## 2020-08-06 ENCOUNTER — Telehealth (INDEPENDENT_AMBULATORY_CARE_PROVIDER_SITE_OTHER): Payer: BC Managed Care – PPO | Admitting: Family

## 2020-08-06 DIAGNOSIS — U071 COVID-19: Secondary | ICD-10-CM

## 2020-08-06 NOTE — Assessment & Plan Note (Addendum)
Second covid infection. She in unvaccinated. No acute respiratory distress. She inquires about prednisone and we agreed at this time in setting of mild, improving symptoms, that evidence is not compelling for use of prednisone.  Continue conservative management and she will let us know if any worsening, or concerns.

## 2020-08-06 NOTE — Patient Instructions (Addendum)
Purchase pulse oximeter to monitor oxygenation saturation. If you oxygen were to drop < 90%, this is an emergency and you may need oxygen support. You would need to seek in person evaluation at the emergency room or call 911.   Utilize over the counter medications to treat symptoms.  Please start Mucinex DM to take at bedtime for cough.     Please begin Vit C 1000 mg daily   Vitamin D3 4000 IU daily   Zinc 50 mg daily   Quercetin 250 mg -500 mg twice daily ; this an antioxidant which reduces inflammation. You can find it at places such as [employer] and [employer] however not limited to these stores.   Once you are better, you may STOP all the above supplements.   Rest, hydrate very well, eat healthy protein food, Tylenol or Advil as directed .    Please go to Acute Care if symptoms worsen but are not an emergency.      Seek  treatment in the ED if respiratory issues/distress develops or unrelieved chest pain. Or, if you become severely weak, dehydrated, confused.     Given what we currently know about COVID-19 and the Omicron variant, CDC is shortening the recommended time for isolation for the public. People with COVID-19 should isolate for 5 days and if they are asymptomatic or their symptoms are resolving (without fever for 24 hours), follow that by 5 days of wearing a mask when around others to minimize the risk of infecting people they encounter. The change is motivated by science demonstrating that the majority of SARS-CoV-2 transmission occurs early in the course of illness, generally in the 1-2 days prior to onset of symptoms and the 2-3 days after.   Additionally, CDC is updating the recommended quarantine period for anyone in the general public who is exposed to COVID-19. For people who are unvaccinated or are more than six months out from their second mRNA dose (or more than 2 months after the J&J vaccine) and not yet boosted, CDC now recommends quarantine for 5 days  followed by strict mask use for an additional 5 days.  YOUR FAMILY or other household contacts, however , even if they feel fine , will need to continue to quarantining themselves  (that means no contact with ANYONE outside of the house)  for 14 days STARTING from the end of your initial 7 day period . (why? because they have been theoretically exposed to you during the entire 7 days of your illness, and they can sheD the virus without symptoms for that period of time ).         10 Things You Can Do to Manage Your COVID-19 Symptoms at Home If you have possible or confirmed COVID-19: 1. Stay home from work and school. And stay away from other public places. If you must go out, avoid using any kind of public transportation, ridesharing, or taxis. 2. Monitor your symptoms carefully. If your symptoms get worse, call your healthcare provider immediately. 3. Get rest and stay hydrated. 4. If you have a medical appointment, call the healthcare provider ahead of time and tell them that you have or may have COVID-19. 5. For medical emergencies, call 911 and notify the dispatch personnel that you have or may have COVID-19. 6. Cover your cough and sneezes with a tissue or use the inside of your elbow. 7. Wash your hands often with soap and water for at least 20 seconds or clean your hands  with an alcohol-based hand sanitizer that contains at least 60% alcohol. 8. As much as possible, stay in a specific room and away from other people in your home. Also, you should use a separate bathroom, if available. If you need to be around other people in or outside of the home, wear a mask. 9. Avoid sharing personal items with other people in your household, like dishes, towels, and bedding. 10. Clean all surfaces that are touched often, like counters, tabletops, and doorknobs. Use household cleaning sprays or wipes according to the label instructions. [email or web address] 12/08/2018

## 2020-08-06 NOTE — Progress Notes (Signed)
Virtual Visit via Video Note  I connected with@  on 08/06/20 at 11:00 AM EST by a video enabled telemedicine application and verified that I am speaking with the correct person using two identifiers.  Location patient: home Location provider:work  Persons participating in the virtual visit: patient, provider  I discussed the limitations of evaluation and management by telemedicine and the availability of in person appointments. The patient expressed understanding and agreed to proceed.   HPI: Acute visit second covid infection Covid positive yesterday Thick nasal congestion which is clear in color, onset started 6 days ago. Endorses dry cough 'every once and while'. Tmax 101 3 days ago, since she has been afebrile.  'This is much more mild' than covid she had 6 months ago.  sa02 98-99%. No sob, cp, wheezing, facial swelling pain.  Has been taking mucinex, and tylenol or ibuprofen as needed.  Had covid 02/2020 'which was worse.' Had MAI 02/17/20.  Eating and drinking well.   No h/o lung disease or smoker.   She is not covid vaccinated.   She was given prednisone the covid and inquires about this today.    ROS: See pertinent positives and negatives per HPI.    EXAM:  VITALS per patient if applicable: Ht 5\' 6"  (1.676 m)   Wt 205 lb (93 kg)   SpO2 98%   BMI 33.09 kg/m  BP Readings from Last 3 Encounters:  02/18/20 133/83  12/16/18 140/84  08/20/18 140/70   Wt Readings from Last 3 Encounters:  08/06/20 205 lb (93 kg)  02/17/20 193 lb (87.5 kg)  12/16/18 209 lb 2 oz (94.9 kg)    GENERAL: alert, oriented, appears well and in no acute distress  HEENT: atraumatic, conjunttiva clear, no obvious abnormalities on inspection of external nose and ears  NECK: normal movements of the head and neck  LUNGS: on inspection no signs of respiratory distress, breathing rate appears normal, no obvious gross SOB, gasping or wheezing  CV: no obvious cyanosis  MS: moves all visible  extremities without noticeable abnormality  PSYCH/NEURO: pleasant and cooperative, no obvious depression or anxiety, speech and thought processing grossly intact  ASSESSMENT AND PLAN:  Discussed the following assessment and plan:  Problem List Items Addressed This Visit      Other   COVID-19 virus infection    Second covid infection. She in unvaccinated. No acute respiratory distress. She inquires about prednisone and we agreed at this time in setting of mild, improving symptoms, that evidence is not compelling for use of prednisone. Continue conservative management and she will let 02/16/19 know if any worsening, or concerns.          -we discussed possible serious and likely etiologies, options for evaluation and workup, limitations of telemedicine visit vs in person visit, treatment, treatment risks and precautions. Pt prefers to treat via telemedicine empirically rather then risking or undertaking an in person visit at this moment.  .   I discussed the assessment and treatment plan with the patient. The patient was provided an opportunity to ask questions and all were answered. The patient agreed with the plan and demonstrated an understanding of the instructions.   The patient was advised to call back or seek an in-person evaluation if the symptoms worsen or if the condition fails to improve as anticipated.   [name], FNP

## 2020-08-08 ENCOUNTER — Encounter: Payer: Self-pay | Admitting: Family

## 2020-08-08 ENCOUNTER — Other Ambulatory Visit: Payer: Self-pay | Admitting: Family

## 2020-08-08 DIAGNOSIS — U071 COVID-19: Secondary | ICD-10-CM

## 2020-08-08 MED ORDER — PREDNISONE 10 MG PO TABS: ORAL_TABLET | ORAL | 0 refills | Status: DC

## 2020-08-10 ENCOUNTER — Encounter: Payer: BC Managed Care – PPO | Admitting: Internal Medicine

## 2020-08-20 ENCOUNTER — Ambulatory Visit
Admission: RE | Admit: 2020-08-20 | Discharge: 2020-08-20 | Disposition: A | Discharge status: Home / Self Care | Payer: BC Managed Care – PPO | Source: Ambulatory Visit | Attending: Internal Medicine | Admitting: Internal Medicine

## 2020-08-20 ENCOUNTER — Other Ambulatory Visit: Payer: Self-pay

## 2020-08-20 DIAGNOSIS — Z1231 Encounter for screening mammogram for malignant neoplasm of breast: Secondary | ICD-10-CM | POA: Diagnosis not present

## 2020-08-28 ENCOUNTER — Encounter: Payer: BC Managed Care – PPO | Admitting: Internal Medicine

## 2020-09-20 ENCOUNTER — Ambulatory Visit: Payer: BC Managed Care – PPO | Admitting: Internal Medicine

## 2020-10-31 NOTE — Progress Notes (Signed)
Patient Care Team: Sherlene Shams, MD as PCP - General (Internal Medicine)   HPI  Brittany Brooks is a 55 y.o. female Seen after more than 3 years.  Previously seen for nocturnal tachypalpitations she now comes in with complaints of a distinct palpitation syndrome at night with flip-flops kind of like a fish.  These are associated with shortness of breath and lightheadedness.  Their frequency can be variable weeks to months between events.  Typically they last 5-10 minutes but have lasted as long as 30.  She notes no particular association.  She drinks alcohol variably.  She snores some but does not have daytime somnolence.  She also complains of episodes where her heart is stopping or fluttering.  These have been associated with PVCs in the past they are sometimes aggravated by meals and relieved by eructation.    Also has had SVT documented in the 130-50 range; P waves were similar to sinus but abrupt onset suggest the possibility of atrial tachycardia.  Previously treated with metoprolol and with improvement.  Now takes only as needed  Has had blood pressures that are variable.  Some mornings she wakes up and is lightheaded>> BP-systolic in the 90s, but of also had blood pressure systolic over 200.  Most days she is in the 130-140 range  Has had COVID twice, 9/21 and 2/22 took some months to get better.  After the second episode has struggled more with neurocognitive issues  Treated hypothyroidism  Urine studies for adrenaline were normal ([facility] remote)       Date PVCs  1/17 0%  9/19 6%       Date TC LDL TG TSH  4/16 272  166 884   12/17     587 (nonfasting) 4.82  12/21    1.817   Repeat lipid panel ordered 2/22 remains incompleted.  She is to see her PCP again.    DATE TEST EF   12 /14 Echo   65 %   3/17 Cath 65   % Normal CA         Past Medical History:  Diagnosis Date  . Diverticulitis   . Dysrhythmia   . Hyperlipidemia   . Hypertension   .  Hypothyroidism    since early 20's, secondary to Hashimoto's  . Multinodular goiter (nontoxic)    biopsy negativre by [facility]  . Pancreatitis 2000   secondary to cholangiogram during lap chole, [facility]  . Sleep apnea     Past Surgical History:  Procedure Laterality Date  . CARDIAC CATHETERIZATION N/A 08/08/2015   Procedure: Left Heart Cath and Coronary Angiography;  Surgeon: Dalia Heading, MD;  Location: [facility] INVASIVE CV LAB;  Service: Cardiovascular;  Laterality: N/A;  . CESAREAN SECTION  Sept 2000  . CHOLECYSTECTOMY  2000   recurrent cholecystitis during pregnancy    Current Outpatient Medications  Medication Sig Dispense Refill  . ALPRAZolam (XANAX) 0.25 MG tablet TAKE ONE TABLET AT BEDTIME 30 tablet 2  . Calcium Citrate 200 MG TABS Take 1 tablet by mouth once daily    . LACTOBACILLUS PO Take 1 capsule by mouth daily.    Marland Kitchen levothyroxine (SYNTHROID) 125 MCG tablet Take 125 mcg by mouth every morning.    Marland Kitchen MAGNESIUM CITRATE PO Take 1 tablet by mouth three times a day    . metoprolol succinate (TOPROL-XL) 50 MG 24 hr tablet Take 50 mg by mouth as needed.    . Multiple Vitamin (MULTIVITAMIN) tablet  Take 1 tablet by mouth daily.    . Omega-3 Fatty Acids (FISH OIL) 1000 MG CAPS Take 1 capsule by mouth daily.     No current facility-administered medications for this visit.    Allergies  Allergen Reactions  . Lexapro [Escitalopram Oxalate] Other (See Comments)    Tachycardia    Social History   Tobacco Use  . Smoking status: Never Smoker  . Smokeless tobacco: Never Used  Vaping Use  . Vaping Use: Never used  Substance Use Topics  . Alcohol use: Yes    Alcohol/week: 4.0 standard drinks    Types: 4 Glasses of wine per week    Comment: occassional  . Drug use: No   Family History  Problem Relation Age of Onset  . Hyperlipidemia Mother   . Hypertension Mother   . COPD Mother   . Hyperlipidemia Father   . Heart disease Father 2       CABG x 4  . Diabetes Father   .  Stroke Father        TIA  . COPD Father   . Cancer Neg Hx   . Breast cancer Neg Hx       Review of Systems negative except from HPI and PMH  Physical Exam BP (!) 122/92 (BP Location: Left Arm, Patient Position: Sitting, Cuff Size: Normal)   Pulse 63   Ht 5\' 6"  (1.676 m)   Wt 211 lb (95.7 kg)   SpO2 98%   BMI 34.06 kg/m   .Alert and oriented in no acute distress HENT- normal Eyes- EOMI, without scleral icterus Skin- warm and dry; without rashes LN-neg Neck- supple without thyromegaly, JVP-flat, carotids brisk and full without bruits Back-without CVAT or kyphosis Lungs-clear to auscultation CV-Regular rate and rhythm, nl S1 and S2, no murmurs gallops or rubs, S4-absent Abd-soft with active bowel sounds; no midline pulsation or hepatomegaly Pulses-intact femoral and distal MKS-without gross deformity Neuro- Ax O, CN3-12 intact, grossly normal motor and sensory function Affect engaging  ECG demonstrates sinus rhythm at 63 Intervals 05/17/1939  Assessment and  Plan  Nocturnal palpitations   Sleep apnea ?  Hypertension   Hyperlipidemia  Treated hypothyroidism  Post COVID  Nocturnal palpitations or other new syndrome.  She has known tachycardia which she treats with as needed metoprolol which is reasonable.  To try to identify the mechanism of these other sporadic events, have recommended an AliveCor monitor.  Nocturnal events could be atrial fibrillation as suggested by the "flopping fish "thromboembolic risk stratification would be appropriate if this were identified, albeit with the brevity they would more likely be more appropriately thought of a SCAF.  In this regard her thyroid function is normal.  Blood pressures are concerning.  The variability is also concerning.  Have suggested she take fluids prior to arising in the morning and if she turns out to have hypertension with think in terms of a calcium blocker, diltiazem or verapamil given the benefits both for  rhythm management as well as for the outcomes improvement associated with hypertension therapy and her not seeing with beta-blockers.  Post COVID and neurocognitive issues hopefully will continue to improve.  Does not seem to have significant cardiopulmonary consequences.

## 2020-11-01 ENCOUNTER — Telehealth: Payer: Self-pay | Admitting: Internal Medicine

## 2020-11-01 ENCOUNTER — Other Ambulatory Visit: Payer: Self-pay | Admitting: Internal Medicine

## 2020-11-01 ENCOUNTER — Encounter: Payer: Self-pay | Admitting: Internal Medicine

## 2020-11-01 ENCOUNTER — Ambulatory Visit: Payer: BC Managed Care – PPO | Admitting: Internal Medicine

## 2020-11-01 ENCOUNTER — Other Ambulatory Visit: Payer: Self-pay

## 2020-11-01 VITALS — BP 122/92 | HR 63 | Ht 66.0 in | Wt 211.0 lb

## 2020-11-01 DIAGNOSIS — I10 Essential (primary) hypertension: Secondary | ICD-10-CM | POA: Diagnosis not present

## 2020-11-01 DIAGNOSIS — R002 Palpitations: Secondary | ICD-10-CM

## 2020-11-01 DIAGNOSIS — E782 Mixed hyperlipidemia: Secondary | ICD-10-CM | POA: Diagnosis not present

## 2020-11-01 NOTE — Telephone Encounter (Signed)
Patient saw her cardiologist yesterday and he wanted her to see her provider to have labs done. Patient has been scheduled for a cpe on 12/13/2020. She was wondering if she could have labs done sooner.

## 2020-11-01 NOTE — Patient Instructions (Addendum)
Medication Instructions:  - Your physician has recommended you make the following change in your medication:   1) Take metoprolol succinate 50 mg- 1 tablet once daily as needed  *If you need a refill on your cardiac medications before your next appointment, please call your pharmacy*   Lab Work: - none ordered  If you have labs (blood work) drawn today and your tests are completely normal, you will receive your results only by: Marland Kitchen MyChart Message (if you have MyChart) OR . A paper copy in the mail If you have any lab test that is abnormal or we need to change your treatment, we will call you to review the results.   Testing/Procedures: - none ordered   Follow-Up: At [facility], you and your health needs are our priority.  As part of our continuing mission to provide you with exceptional heart care, we have created designated Provider Care Teams.  These Care Teams include your primary Cardiologist (physician) and Advanced Practice Providers (APPs -  Physician Assistants and Nurse Practitioners) who all work together to provide you with the care you need, when you need it.  We recommend signing up for the patient portal called "MyChart".  Sign up information is provided on this After Visit Summary.  MyChart is used to connect with patients for Virtual Visits (Telemedicine).  Patients are able to view lab/test results, encounter notes, upcoming appointments, etc.  Non-urgent messages can be sent to your provider as well.   To learn more about what you can do with MyChart, go to [email or web address].    Your next appointment:   6 month(s)  The format for your next appointment:   In Person  Provider:   Sherryl Manges, MD   Other Instructions  1) Monitor your blood pressure at home:  How to Take Your Blood Pressure Blood pressure measures how strongly your blood is pressing against the walls of your arteries. Arteries are blood vessels that carry blood from your heart  throughout your body. You can take your blood pressure at home with a machine. You may need to check your blood pressure at home:  To check if you have high blood pressure (hypertension).  To check your blood pressure over time.  To make sure your blood pressure medicine is working. Supplies needed:  Blood pressure machine, or monitor.  Dining room chair to sit in.  Table or desk.  Small notebook.  Pencil or pen. How to prepare Avoid these things for 30 minutes before checking your blood pressure:  Having drinks with caffeine in them, such as coffee or tea.  Drinking alcohol.  Eating.  Smoking.  Exercising. Do these things five minutes before checking your blood pressure:  Go to the bathroom and pee (urinate).  Sit in a dining chair. Do not sit in a soft couch or an armchair.  Be quiet. Do not talk. How to take your blood pressure Follow the instructions that came with your machine. If you have a digital blood pressure monitor, these may be the instructions: 1. Sit up straight. 2. Place your feet on the floor. Do not cross your ankles or legs. 3. Rest your left arm at the level of your heart. You may rest it on a table, desk, or chair. 4. Pull up your shirt sleeve. 5. Wrap the blood pressure cuff around the upper part of your left arm. The cuff should be 1 inch (2.5 cm) above your elbow. It is best to wrap the cuff around bare skin.  6. Fit the cuff snugly around your arm. You should be able to place only one finger between the cuff and your arm. 7. Place the cord so that it rests in the bend of your elbow. 8. Press the power button. 9. Sit quietly while the cuff fills with air and loses air. 10. Write down the numbers on the screen. 11. Wait 2-3 minutes and then repeat steps 1-10.   What do the numbers mean? Two numbers make up your blood pressure. The first number is called systolic pressure. The second is called diastolic pressure. An example of a blood pressure  reading is "120 over 80" (or 120/80). If you are an adult and do not have a medical condition, use this guide to find out if your blood pressure is normal: Normal  First number: below 120.  Second number: below 80. Elevated  First number: 120-129.  Second number: below 80. Hypertension stage 1  First number: 130-139.  Second number: 80-89. Hypertension stage 2  First number: 140 or above.  Second number: 90 or above. Your blood pressure is above normal even if only the top or bottom number is above normal. Follow these instructions at home:  Check your blood pressure as often as your doctor tells you to.  Check your blood pressure at the same time every day.  Take your monitor to your next doctor's appointment. Your doctor will: ? Make sure you are using it correctly. ? Make sure it is working right.  Make sure you understand what your blood pressure numbers should be.  Tell your doctor if your medicine is causing side effects.  Keep all follow-up visits as told by your doctor. This is important. General tips:  You will need a blood pressure machine, or monitor. Your doctor can suggest a monitor. You can buy one at a drugstore or online. When choosing one: ? Choose one with an arm cuff. ? Choose one that wraps around your upper arm. Only one finger should fit between your arm and the cuff. ? Do not choose one that measures your blood pressure from your wrist or finger. Where to find more information American Heart Association: www.heart.org Contact a doctor if:  Your blood pressure keeps being high. Get help right away if:  Your first blood pressure number is higher than 180.  Your second blood pressure number is higher than 120. Summary  Check your blood pressure at the same time every day.  Avoid caffeine, alcohol, smoking, and exercise for 30 minutes before checking your blood pressure.  Make sure you understand what your blood pressure numbers should  be. This information is not intended to replace advice given to you by your health care provider. Make sure you discuss any questions you have with your health care provider. Document Revised: 05/20/2019 Document Reviewed: 05/20/2019 Elsevier Patient Education  2021 Elsevier Inc.     2) Try to obtain an Alive Cor Lourena Simmonds) monitor:   Lourena Simmonds for monitoring of EKGs at home: You can look into the [facility] device by [employer].This device is purchased by you and it connects to an application you download to your smart phone.  It can detect abnormal heart rhythms and alert you to contact your doctor for further evaluation. The device is approximately $90 and the phone application is free.  The web site is:  https://www.alivecor.com

## 2020-11-01 NOTE — Telephone Encounter (Signed)
Rx request sent to pharmacy.  

## 2020-11-02 NOTE — Telephone Encounter (Signed)
error 

## 2020-11-02 NOTE — Telephone Encounter (Signed)
Spoke with pt and scheduled her for her lab appt.

## 2020-11-27 DIAGNOSIS — F331 Major depressive disorder, recurrent, moderate: Secondary | ICD-10-CM | POA: Diagnosis not present

## 2020-12-07 ENCOUNTER — Other Ambulatory Visit: Payer: Self-pay

## 2020-12-07 ENCOUNTER — Other Ambulatory Visit (INDEPENDENT_AMBULATORY_CARE_PROVIDER_SITE_OTHER): Payer: BC Managed Care – PPO

## 2020-12-07 DIAGNOSIS — R7303 Prediabetes: Secondary | ICD-10-CM | POA: Diagnosis not present

## 2020-12-07 DIAGNOSIS — E042 Nontoxic multinodular goiter: Secondary | ICD-10-CM

## 2020-12-07 DIAGNOSIS — E782 Mixed hyperlipidemia: Secondary | ICD-10-CM

## 2020-12-07 LAB — COMPREHENSIVE METABOLIC PANEL
ALT: 15 U/L (ref 0–35)
AST: 15 U/L (ref 0–37)
Albumin: 4.3 g/dL (ref 3.5–5.2)
Alkaline Phosphatase: 40 U/L (ref 39–117)
BUN: 16 mg/dL (ref 6–23)
CO2: 30 mEq/L (ref 19–32)
Calcium: 9.5 mg/dL (ref 8.4–10.5)
Chloride: 102 mEq/L (ref 96–112)
Creatinine, Ser: 0.74 mg/dL (ref 0.40–1.20)
GFR: 92.91 mL/min (ref >60.00)
Glucose, Bld: 90 mg/dL (ref 70–99)
Potassium: 4.8 mEq/L (ref 3.5–5.1)
Sodium: 139 mEq/L (ref 135–145)
Total Bilirubin: 0.5 mg/dL (ref 0.2–1.2)
Total Protein: 7 g/dL (ref 6.0–8.3)

## 2020-12-07 LAB — LIPID PANEL
Cholesterol: 341 mg/dL — ABNORMAL HIGH (ref 0–200)
HDL: 44.8 mg/dL (ref >39.00)
NonHDL: 296.19
Total CHOL/HDL Ratio: 8
Triglycerides: 225 mg/dL — ABNORMAL HIGH (ref 0.0–149.0)
VLDL: 45 mg/dL — ABNORMAL HIGH (ref 0.0–40.0)

## 2020-12-07 LAB — LDL CHOLESTEROL, DIRECT: Direct LDL: 246 mg/dL

## 2020-12-07 LAB — HEMOGLOBIN A1C: Hgb A1c MFr Bld: 6 % (ref 4.6–6.5)

## 2020-12-07 LAB — TSH: TSH: 1.73 u[IU]/mL (ref 0.35–5.50)

## 2020-12-12 DIAGNOSIS — F4323 Adjustment disorder with mixed anxiety and depressed mood: Secondary | ICD-10-CM | POA: Diagnosis not present

## 2020-12-13 ENCOUNTER — Other Ambulatory Visit: Payer: Self-pay

## 2020-12-13 ENCOUNTER — Encounter: Payer: Self-pay | Admitting: Internal Medicine

## 2020-12-13 ENCOUNTER — Ambulatory Visit (INDEPENDENT_AMBULATORY_CARE_PROVIDER_SITE_OTHER): Payer: BC Managed Care – PPO | Admitting: Internal Medicine

## 2020-12-13 DIAGNOSIS — E038 Other specified hypothyroidism: Secondary | ICD-10-CM

## 2020-12-13 DIAGNOSIS — F331 Major depressive disorder, recurrent, moderate: Secondary | ICD-10-CM | POA: Diagnosis not present

## 2020-12-13 DIAGNOSIS — I10 Essential (primary) hypertension: Secondary | ICD-10-CM | POA: Diagnosis not present

## 2020-12-13 DIAGNOSIS — R7303 Prediabetes: Secondary | ICD-10-CM

## 2020-12-13 DIAGNOSIS — E669 Obesity, unspecified: Secondary | ICD-10-CM

## 2020-12-13 DIAGNOSIS — Z23 Encounter for immunization: Secondary | ICD-10-CM

## 2020-12-13 DIAGNOSIS — E063 Autoimmune thyroiditis: Secondary | ICD-10-CM

## 2020-12-13 DIAGNOSIS — Z0001 Encounter for general adult medical examination with abnormal findings: Secondary | ICD-10-CM | POA: Diagnosis not present

## 2020-12-13 DIAGNOSIS — I700 Atherosclerosis of aorta: Secondary | ICD-10-CM

## 2020-12-13 DIAGNOSIS — E782 Mixed hyperlipidemia: Secondary | ICD-10-CM

## 2020-12-13 DIAGNOSIS — Z1211 Encounter for screening for malignant neoplasm of colon: Secondary | ICD-10-CM

## 2020-12-13 MED ORDER — SULFAMETHOXAZOLE-TRIMETHOPRIM 800-160 MG PO TABS: 1.0000 | ORAL_TABLET | Freq: Two times a day (BID) | ORAL | 0 refills | Status: DC

## 2020-12-13 MED ORDER — ALPRAZOLAM 0.25 MG PO TABS: 0.2500 mg | ORAL_TABLET | Freq: Every day | ORAL | 2 refills | Status: DC

## 2020-12-13 NOTE — Progress Notes (Signed)
Patient ID: Brittany Brooks, female    DOB: [birth year]  Age: 55 y.o. MRN: [id number]  The patient is here for annual non GYN preventive  examination and management of other chronic and acute problems.  This visit occurred during the SARS-CoV-2 public health emergency.  Safety protocols were in place, including screening questions prior to the visit, additional usage of staff PPE, and extensive cleaning of exam room while observing appropriate contact time as indicated for disinfecting solutions.     The risk factors are reflected in the social history.  The roster of all physicians providing medical care to patient - is listed in the Snapshot section of the chart.  Activities of daily living:  The patient is 100% independent in all ADLs: dressing, toileting, feeding as well as independent mobility  Home safety : The patient has smoke detectors in the home. They wear seatbelts.  There are no firearms at home. There is no violence in the home.   There is no risks for hepatitis, STDs or HIV. There is no   history of blood transfusion. They have no travel history to infectious disease endemic areas of the world.  The patient has seen their dentist in the last six month. They have seen their eye doctor in the last year. She denies any hearing difficulty with regard to whispered voices and some television programs.  They have deferred audiologic testing in the last year.  They do not  have excessive sun exposure. Discussed the need for sun protection: hats, long sleeves and use of sunscreen if there is significant sun exposure.   Diet: the importance of a healthy diet is discussed. They do have a healthy diet.  The benefits of regular aerobic exercise were discussed. She walks 4 times per week ,  20 minutes.   Depression screen: there are signs and  vegetative symptoms of depression- irritability, change in appetite, anhedonia, sadness/tearfullness.  The following portions of the patient's history  were reviewed and updated as appropriate: allergies, current medications, past family history, past medical history,  past surgical history, past social history  and problem list.  Visual acuity was not assessed per patient preference since she has regular follow up with her ophthalmologist. Hearing and body mass index were assessed and reviewed.   During the course of the visit the patient was educated and counseled about appropriate screening and preventive services including : fall prevention , diabetes screening, nutrition counseling, colorectal cancer screening, and recommended immunizations.    CC: Diagnoses of Colon cancer screening, Need for diphtheria-tetanus-pertussis (Tdap) vaccine, Moderate episode of recurrent major depressive disorder (HCC), Essential hypertension, Mixed hyperlipidemia, Aortic atherosclerosis (HCC), Hypothyroidism due to Hashimoto's thyroiditis, Obesity (BMI 30-39.9), Pre-diabetes, and Encounter for general adult medical examination with abnormal findings were pertinent to this visit.   1) hyperlipidemia:  untreated,   cardiac risk is 8% using the AHA cardiac risk calculator.  She reports that the last year has been difficult and stressful starting with her COVID infection in September which left her tired and  intolerant of healthy lifestyle. Has entered menopause,  craving sweets.  Has gained 15 lbs back . Has been working from home since pandemic started.  Likes going to the office,  has sought counselling on her own due to ongoing marital strife/conflict.  Has had periods of active depression but feels she is coming out of it now.  The worst period was Dec to March and she admits to feeling suicidal;  she has an appt with  Shireen Quan, MD at end of august.   2) prediabetes:  a1c has risen to 6.0   3) hypothyroidism:  denies any overt symptoms of hypothyroidism, but has gained weight.   4) obesity:  she has lost 50 lbs in the recent past using the KETO diet  , but has  been "riding the line" between high fat and high carb diet .   History Jazmyne has a past medical history of Diverticulitis, Dysrhythmia, Hyperlipidemia, Hypertension, Hypothyroidism, Multinodular goiter (nontoxic), Pancreatitis (2000), and Sleep apnea.   She has a past surgical history that includes Cholecystectomy (2000); Cesarean section (Sept 2000); and Cardiac catheterization (N/A, 08/08/2015).   Her family history includes COPD in her father and mother; Diabetes in her father; Heart disease (age of onset: 43) in her father; Hyperlipidemia in her father and mother; Hypertension in her mother; Stroke in her father.She reports that she has never smoked. She has never used smokeless tobacco. She reports current alcohol use of about 4.0 standard drinks of alcohol per week. She reports that she does not use drugs.  Outpatient Medications Prior to Visit  Medication Sig Dispense Refill   Calcium Citrate 200 MG TABS Take 1 tablet by mouth once daily     LACTOBACILLUS PO Take 1 capsule by mouth daily.     levothyroxine (SYNTHROID) 125 MCG tablet Take 125 mcg by mouth every morning.     MAGNESIUM CITRATE PO Take 1 tablet by mouth three times a day     metoprolol succinate (TOPROL-XL) 50 MG 24 hr tablet Take 1 tablet (50 mg total) by mouth daily as needed. 30 tablet 6   Multiple Vitamin (MULTIVITAMIN) tablet Take 1 tablet by mouth daily.     Omega-3 Fatty Acids (FISH OIL) 1000 MG CAPS Take 1 capsule by mouth daily.     ALPRAZolam (XANAX) 0.25 MG tablet TAKE ONE TABLET AT BEDTIME 30 tablet 2   No facility-administered medications prior to visit.    Review of Systems  Patient denies headache, fevers, malaise, unintentional weight loss, skin rash, eye pain, sinus congestion and sinus pain, sore throat, dysphagia,  hemoptysis , cough, dyspnea, wheezing, chest pain, palpitations, orthopnea, edema, abdominal pain, nausea, melena, diarrhea, constipation, flank pain, dysuria, hematuria, urinary  Frequency,  nocturia, numbness, tingling, seizures,  Focal weakness, Loss of consciousness,  Tremor, insomnia, depression, anxiety, and suicidal ideation.     Objective:  BP 118/74 (BP Location: Left Arm, Patient Position: Sitting, Cuff Size: Large)   Pulse (!) 58   Temp (!) 96.7 F (35.9 C) (Temporal)   Resp 14   Ht 5\' 6"  (1.676 m)   Wt 208 lb 3.2 oz (94.4 kg)   SpO2 99%   BMI 33.60 kg/m   Physical Exam  General appearance: alert, cooperative and appears stated age Ears: normal TM's and external ear canals both ears Throat: lips, mucosa, and tongue normal; teeth and gums normal Neck: no adenopathy, no carotid bruit, supple, symmetrical, trachea midline and thyroid not enlarged, symmetric, no tenderness/mass/nodules Back: symmetric, no curvature. ROM normal. No CVA tenderness. Lungs: clear to auscultation bilaterally Heart: regular rate and rhythm, S1, S2 normal, no murmur, click, rub or gallop Abdomen: soft, non-tender; bowel sounds normal; no masses,  no organomegaly Pulses: 2+ and symmetric Skin: Skin color, texture, turgor normal. No rashes or lesions Lymph nodes: Cervical, supraclavicular, and axillary nodes normal.     Assessment & Plan:   Problem List Items Addressed This Visit       Unprioritized   Aortic  atherosclerosis (HCC)    Noted on 2018  Abdominal CT;  Not clear if minimal or moderate,  But she has hepatic steatosis, LDL > 170, hypertension and FH of CAD.  Will recommend statin for primary prevention       Encounter for general adult medical examination with abnormal findings    age appropriate education and counseling updated, referrals for preventative services and immunizations addressed, dietary and smoking counseling addressed, most recent labs reviewed.  I have personally reviewed and have noted:   1) the patient's medical and social history 2) The pt's use of alcohol, tobacco, and illicit drugs 3) The patient's current medications and supplements 4)  Functional ability including ADL's, fall risk, home safety risk, hearing and visual impairment 5) Diet and physical activities 6) Evidence for depression or mood disorder 7) The patient's height, weight, and BMI have been recorded in the chart   I have made referrals, and provided counseling and education based on review of the above       Essential hypertension    Managed with metoprolol.  She is at goal        Hypothyroidism due to Hashimoto's thyroiditis    Thyroid function is at goal on 125 mcg daily; no changes today  Lab Results  Component Value Date   TSH 1.73 12/07/2020          Major depressive disorder, recurrent episode (HCC)    She is not suicidal and prefers to postpone use of daily medications until she is seen by Dr Maryruth Bun.  She is not suicidal , but admits that there was a period in march when she was contemplative but was talked down by a friend.  She is in counselling. She requests [name] of low dose alprazolam to be used prn anxiety /ander.  The risks and benefits of benzodiazepine use were discussed with patient today including excessive sedation leading to respiratory depression,  impaired thinking/driving, and addiction.  Patient was advised to avoid concurrent use with alcohol, to use medication only as needed and not to share with others  .        Relevant Medications   ALPRAZolam (XANAX) 0.25 MG tablet   Mixed hyperlipidemia    LDL remains very high.  Aortic atherosclerosis was noted on 2018 CT.  Advised her to consider statin therapy as primary prevention;  She is contemplative of Dr Graciela Husbands agrees.   Lab Results  Component Value Date   CHOL 341 (H) 12/07/2020   HDL 44.80 12/07/2020   LDLCALC 170 (H) 08/18/2018   LDLDIRECT 246.0 12/07/2020   TRIG 225.0 (H) 12/07/2020   CHOLHDL 8 12/07/2020          Obesity (BMI 30-39.9)    Reviewed her previous success in reducing her  BMI using KETO diet but cautioned against using it in favor of a more plant based  diet given her elevated LDL . Encouraged to adopt  a low glycemic index diet and regular exercise a minimum of 5 days per week. Given her elevated a1c,  She is at risk for diabetes; will offer Ozempic at next visit if she has been unable to lose 12 lbs or more.   Lab Results  Component Value Date   HGBA1C 6.0 12/07/2020           Pre-diabetes    A1c had been lowered to 5.8  with KETO diet  But has risen slightly.  Discussed today    Lab Results  Component Value Date  HGBA1C 6.0 12/07/2020          Other Visit Diagnoses     Colon cancer screening       Relevant Orders   Cologuard   Need for diphtheria-tetanus-pertussis (Tdap) vaccine       Relevant Orders   Tdap vaccine greater than or equal to 7yo IM (Completed)       I have changed Clydie BraunKaren C. Flamm's ALPRAZolam. I am also having her start on sulfamethoxazole-trimethoprim. Additionally, I am having her maintain her multivitamin, Fish Oil, MAGNESIUM CITRATE PO, Calcium Citrate, LACTOBACILLUS PO, levothyroxine, and metoprolol succinate.  Meds ordered this encounter  Medications   sulfamethoxazole-trimethoprim (BACTRIM DS) 800-160 MG tablet    Sig: Take 1 tablet by mouth 2 (two) times daily.    Dispense:  14 tablet    Refill:  0   ALPRAZolam (XANAX) 0.25 MG tablet    Sig: Take 1 tablet (0.25 mg total) by mouth at bedtime.    Dispense:  30 tablet    Refill:  2    Medications Discontinued During This Encounter  Medication Reason   ALPRAZolam (XANAX) 0.25 MG tablet Reorder    Follow-up: Return in about 3 months (around 03/15/2021).   Sherlene Shamseresa L Karem Tomaso, MD

## 2020-12-13 NOTE — Patient Instructions (Signed)
Your 2018 CT mentioned aortic atheroslcerosis in the summary section  Given your elevated cholesterol we need to consider statin therapy  I will talk to dr Graciela Husbands first.   [facility]ra D[facility] episode of redness/inflammation of the cyst on your abdomen     Check with your insurance about the cologuard test as a covered screening test for colon cancer.    You need a tetanus booster if you have not had one in ten years

## 2020-12-15 DIAGNOSIS — Z0000 Encounter for general adult medical examination without abnormal findings: Secondary | ICD-10-CM | POA: Insufficient documentation

## 2020-12-15 DIAGNOSIS — Z0001 Encounter for general adult medical examination with abnormal findings: Secondary | ICD-10-CM | POA: Insufficient documentation

## 2020-12-15 DIAGNOSIS — I700 Atherosclerosis of aorta: Secondary | ICD-10-CM | POA: Insufficient documentation

## 2020-12-15 NOTE — Assessment & Plan Note (Addendum)
LDL remains very high.  Aortic atherosclerosis was noted on 2018 CT.  Advised her to consider statin therapy as primary prevention;  She is contemplative of Dr Graciela Husbands agrees.   Lab Results  Component Value Date   CHOL 341 (H) 12/07/2020   HDL 44.80 12/07/2020   LDLCALC 170 (H) 08/18/2018   LDLDIRECT 246.0 12/07/2020   TRIG 225.0 (H) 12/07/2020   CHOLHDL 8 12/07/2020

## 2020-12-15 NOTE — Assessment & Plan Note (Signed)
Thyroid function is at goal on 125 mcg daily; no changes today  Lab Results  Component Value Date   TSH 1.73 12/07/2020

## 2020-12-15 NOTE — Assessment & Plan Note (Signed)
Noted on 2018  Abdominal CT;  Not clear if minimal or moderate,  But she has hepatic steatosis, LDL > 170, hypertension and FH of CAD.  Will recommend statin for primary prevention

## 2020-12-15 NOTE — Assessment & Plan Note (Signed)

## 2020-12-15 NOTE — Assessment & Plan Note (Signed)
Managed with metoprolol.  She is at goal

## 2020-12-15 NOTE — Assessment & Plan Note (Signed)
She is not suicidal and prefers to postpone use of daily medications until she is seen by Dr Brittany Brooks.  She is not suicidal , but admits that there was a period in march when she was contemplative but was talked down by a friend.  She is in counselling. She requests [name] of low dose alprazolam to be used prn anxiety /ander.  The risks and benefits of benzodiazepine use were discussed with patient today including excessive sedation leading to respiratory depression,  impaired thinking/driving, and addiction.  Patient was advised to avoid concurrent use with alcohol, to use medication only as needed and not to share with others  .

## 2020-12-15 NOTE — Assessment & Plan Note (Signed)
A1c had been lowered to 5.8  with KETO diet  But has risen slightly.  Discussed today    Lab Results  Component Value Date   HGBA1C 6.0 12/07/2020

## 2020-12-15 NOTE — Assessment & Plan Note (Signed)
Reviewed her previous success in reducing her  BMI using KETO diet but cautioned against using it in favor of a more plant based diet given her elevated LDL . Encouraged to adopt  a low glycemic index diet and regular exercise a minimum of 5 days per week. Given her elevated a1c,  She is at risk for diabetes; will offer Ozempic at next visit if she has been unable to lose 12 lbs or more.   Lab Results  Component Value Date   HGBA1C 6.0 12/07/2020

## 2020-12-17 DIAGNOSIS — F4323 Adjustment disorder with mixed anxiety and depressed mood: Secondary | ICD-10-CM | POA: Diagnosis not present

## 2020-12-27 DIAGNOSIS — F4323 Adjustment disorder with mixed anxiety and depressed mood: Secondary | ICD-10-CM | POA: Diagnosis not present

## 2021-01-01 ENCOUNTER — Telehealth: Payer: Self-pay | Admitting: Internal Medicine

## 2021-01-01 NOTE — Telephone Encounter (Signed)
Called and spoke with Brittany Brooks. Brittany Brooks states that she has had stomach pain starting on 12/29/20. Patient rated the pain on Saturday as a 10 on the pain scale. Pt states that since then she has not had a full bowel movement. She will go to the restroom and it will not feel complete. Pain has lessened since then and is now only irritating instead of being painful. Pt states that she has had divirticulitis before and fears it is another flare up. I informed her that with her symptoms, Dr. [name] would find it best if she is evaluated. Brittany Brooks voiced concerns over the wait time of being in a waiting room. I recommened [facility] for Brittany Brooks to go to as they have an urgent care and also the mychart urgent care visits that she can use at home. Brittany Brooks verbalized understanding and is agreeable to go to urgent care. She states that she will go to [facility] tomorrow as her husband is on sick leave and can not drive, and she is with him currently. She had no further questions.

## 2021-01-01 NOTE — Telephone Encounter (Signed)
Access Nurse called back stating that the disposition for this patient was to be seen in the ED. The patient refused to go to the ED.Routing to CMA for follow up.

## 2021-01-01 NOTE — Telephone Encounter (Signed)
See telephone/Access nurse note

## 2021-01-01 NOTE — Telephone Encounter (Signed)
Patient informed, Due to the high volume of calls and your symptoms we have to forward your call to our Triage Nurse to expedient your call. Please hold for the transfer.  Patient transferred to [facility] at  Access Nurse. Due to diverticulitis flare up.

## 2021-01-03 DIAGNOSIS — F4323 Adjustment disorder with mixed anxiety and depressed mood: Secondary | ICD-10-CM | POA: Diagnosis not present

## 2021-01-14 ENCOUNTER — Telehealth: Payer: Self-pay | Admitting: Internal Medicine

## 2021-01-14 DIAGNOSIS — Z79899 Other long term (current) drug therapy: Secondary | ICD-10-CM

## 2021-01-14 MED ORDER — ATORVASTATIN CALCIUM 20 MG PO TABS: 20.0000 mg | ORAL_TABLET | Freq: Every day | ORAL | 0 refills | Status: DC

## 2021-01-14 NOTE — Telephone Encounter (Signed)
DR. Graciela Husbands her cardiologist agrees she needs to be on a statin   sending rx to pharmacy , needs nonfasting labs 3 weeks after medication start.

## 2021-01-15 NOTE — Telephone Encounter (Signed)
Spoke with pt and informed her of the message below and the medication that was sent in. Pt gave a verbal understanding and scheduled a lab appt in 3 weeks.

## 2021-01-15 NOTE — Telephone Encounter (Signed)
LMTCB

## 2021-01-16 DIAGNOSIS — F4323 Adjustment disorder with mixed anxiety and depressed mood: Secondary | ICD-10-CM | POA: Diagnosis not present

## 2021-01-29 DIAGNOSIS — F4323 Adjustment disorder with mixed anxiety and depressed mood: Secondary | ICD-10-CM | POA: Diagnosis not present

## 2021-02-04 DIAGNOSIS — F4323 Adjustment disorder with mixed anxiety and depressed mood: Secondary | ICD-10-CM | POA: Diagnosis not present

## 2021-02-04 DIAGNOSIS — F5105 Insomnia due to other mental disorder: Secondary | ICD-10-CM | POA: Diagnosis not present

## 2021-02-06 ENCOUNTER — Other Ambulatory Visit: Payer: BC Managed Care – PPO

## 2021-03-20 ENCOUNTER — Ambulatory Visit: Payer: BC Managed Care – PPO | Admitting: Internal Medicine

## 2021-03-30 ENCOUNTER — Other Ambulatory Visit: Payer: Self-pay | Admitting: Internal Medicine

## 2021-04-23 ENCOUNTER — Ambulatory Visit: Payer: BC Managed Care – PPO | Admitting: Internal Medicine

## 2021-05-29 ENCOUNTER — Other Ambulatory Visit: Payer: Self-pay

## 2021-05-29 ENCOUNTER — Ambulatory Visit: Payer: BC Managed Care – PPO | Admitting: Internal Medicine

## 2021-05-29 ENCOUNTER — Encounter: Payer: Self-pay | Admitting: Internal Medicine

## 2021-05-29 VITALS — BP 158/110 | HR 71 | Temp 97.5°F | Ht 66.0 in | Wt 216.2 lb

## 2021-05-29 DIAGNOSIS — E038 Other specified hypothyroidism: Secondary | ICD-10-CM | POA: Diagnosis not present

## 2021-05-29 DIAGNOSIS — E063 Autoimmune thyroiditis: Secondary | ICD-10-CM

## 2021-05-29 DIAGNOSIS — G4733 Obstructive sleep apnea (adult) (pediatric): Secondary | ICD-10-CM | POA: Diagnosis not present

## 2021-05-29 DIAGNOSIS — E559 Vitamin D deficiency, unspecified: Secondary | ICD-10-CM

## 2021-05-29 DIAGNOSIS — I700 Atherosclerosis of aorta: Secondary | ICD-10-CM

## 2021-05-29 DIAGNOSIS — I10 Essential (primary) hypertension: Secondary | ICD-10-CM

## 2021-05-29 DIAGNOSIS — F331 Major depressive disorder, recurrent, moderate: Secondary | ICD-10-CM

## 2021-05-29 MED ORDER — ESCITALOPRAM OXALATE 10 MG PO TABS: 10.0000 mg | ORAL_TABLET | Freq: Every day | ORAL | 2 refills | Status: DC

## 2021-05-29 NOTE — Progress Notes (Signed)
Subjective:  Patient ID: Brittany Brooks, female    DOB: 02/18/1966  Age: 55 y.o. MRN: [id number]  CC: The primary encounter diagnosis was Hypothyroidism due to Hashimoto's thyroiditis. Diagnoses of Vitamin D deficiency, Essential hypertension, OSA (obstructive sleep apnea), Moderate episode of recurrent major depressive disorder ([place]), and Aortic atherosclerosis ([place]) were also pertinent to this visit.  HPI Brittany Brooks presents for follow up  Chief Complaint  Patient presents with   Follow-up    Hypothyroidism, and hyperlipidemia   1)Patient is taking her medications as prescribed and notes no adverse effects.  Home BP readings have not been done in the last several months.  She is avoiding added salt in her diet and has stopped walking regularly about 3 times per week for exercise  .    2) obesity;  20 lb weight gain since Sept 2021 . Not exercising due to workload,  financial stress  of  carrying the family  for the past 3 years (husband has not worked at all since May,  off and on since 2011 )   3) Hyperlipidemia:  has not started statin therapy .  Willing to do so now   4)  Positive depression screen: aggravated by marital conflict:  marriage dissolving due to husband's disinclination to work for the last several years because of chronic health problems aggravated by nonadherence   Outpatient Medications Prior to Visit  Medication Sig Dispense Refill   ALPRAZolam (XANAX) 0.25 MG tablet Take 1 tablet (0.25 mg total) by mouth at bedtime. 30 tablet 2   Calcium Citrate 200 MG TABS Take 1 tablet by mouth once daily     cholecalciferol (VITAMIN D3) 25 MCG (1000 UNIT) tablet Take 1,000 Units by mouth daily.     LACTOBACILLUS PO Take 1 capsule by mouth daily.     levothyroxine (SYNTHROID) 125 MCG tablet TAKE 1 TABLET EVERY DAY ON EMPTY STOMACHWITH A GLASS OF WATER AT LEAST 30-60 MINBEFORE BREAKFAST 30 tablet 1   MAGNESIUM CITRATE PO Take 1 tablet by mouth three times a day     metoprolol  succinate (TOPROL-XL) 50 MG 24 hr tablet Take 1 tablet (50 mg total) by mouth daily as needed. 30 tablet 6   Multiple Vitamin (MULTIVITAMIN) tablet Take 1 tablet by mouth daily.     Omega-3 Fatty Acids (FISH OIL) 1000 MG CAPS Take 1 capsule by mouth daily.     zinc gluconate 50 MG tablet Take 50 mg by mouth daily.     atorvastatin (LIPITOR) 20 MG tablet Take 1 tablet (20 mg total) by mouth daily. (Patient not taking: Reported on 05/29/2021) 90 tablet 0   sulfamethoxazole-trimethoprim (BACTRIM DS) 800-160 MG tablet Take 1 tablet by mouth 2 (two) times daily. (Patient not taking: Reported on 05/29/2021) 14 tablet 0   No facility-administered medications prior to visit.    Review of Systems;  Patient denies headache, fevers, malaise, unintentional weight loss, skin rash, eye pain, sinus congestion and sinus pain, sore throat, dysphagia,  hemoptysis , cough, dyspnea, wheezing, chest pain, palpitations, orthopnea, edema, abdominal pain, nausea, melena, diarrhea, constipation, flank pain, dysuria, hematuria, urinary  Frequency, nocturia, numbness, tingling, seizures,  Focal weakness, Loss of consciousness,  Tremor, insomnia, depression, anxiety, and suicidal ideation.      Objective:  BP (!) 158/110 (BP Location: Left Arm, Patient Position: Sitting, Cuff Size: Large)    Pulse 71    Temp (!) 97.5 F (36.4 C) (Oral)    Ht 5' 6"  (1.676 m)  Wt 216 lb 3.2 oz (98.1 kg)    SpO2 98%    BMI 34.90 kg/m   BP Readings from Last 3 Encounters:  05/29/21 (!) 158/110  12/13/20 118/74  11/01/20 (!) 122/92    Wt Readings from Last 3 Encounters:  05/29/21 216 lb 3.2 oz (98.1 kg)  12/13/20 208 lb 3.2 oz (94.4 kg)  11/01/20 211 lb (95.7 kg)    General appearance: alert, cooperative and appears stated age Ears: normal TM's and external ear canals both ears Throat: lips, mucosa, and tongue normal; teeth and gums normal Neck: no adenopathy, no carotid bruit, supple, symmetrical, trachea midline and thyroid  not enlarged, symmetric, no tenderness/mass/nodules Back: symmetric, no curvature. ROM normal. No CVA tenderness. Lungs: clear to auscultation bilaterally Heart: regular rate and rhythm, S1, S2 normal, no murmur, click, rub or gallop Abdomen: soft, non-tender; bowel sounds normal; no masses,  no organomegaly Pulses: 2+ and symmetric Skin: Skin color, texture, turgor normal. No rashes or lesions Lymph nodes: Cervical, supraclavicular, and axillary nodes normal.  Lab Results  Component Value Date   HGBA1C 6.0 12/07/2020   HGBA1C 5.8 08/18/2018   HGBA1C 6.1 05/12/2018    Lab Results  Component Value Date   CREATININE 0.69 05/29/2021   CREATININE 0.74 12/07/2020   CREATININE 0.62 08/18/2018    Lab Results  Component Value Date   WBC 7.3 02/10/2018   HGB 12.7 02/10/2018   HCT 38.0 02/10/2018   PLT 302 02/10/2018   GLUCOSE 87 05/29/2021   CHOL 341 (H) 12/07/2020   TRIG 225.0 (H) 12/07/2020   HDL 44.80 12/07/2020   LDLDIRECT 246.0 12/07/2020   LDLCALC 170 (H) 08/18/2018   ALT 12 05/29/2021   AST 16 05/29/2021   NA 138 05/29/2021   K 4.3 05/29/2021   CL 101 05/29/2021   CREATININE 0.69 05/29/2021   BUN 13 05/29/2021   CO2 30 05/29/2021   TSH 0.61 05/29/2021   HGBA1C 6.0 12/07/2020   MICROALBUR <0.7 05/29/2021    MM 3D SCREEN BREAST BILATERAL  Result Date: 08/21/2020 CLINICAL DATA:  Screening. EXAM: DIGITAL SCREENING BILATERAL MAMMOGRAM WITH TOMOSYNTHESIS AND CAD TECHNIQUE: Bilateral screening digital craniocaudal and mediolateral oblique mammograms were obtained. Bilateral screening digital breast tomosynthesis was performed. The images were evaluated with computer-aided detection. COMPARISON:  Previous exam(s). ACR Breast Density Category c: The breast tissue is heterogeneously dense, which may obscure small masses. FINDINGS: There are no findings suspicious for malignancy. The images were evaluated with computer-aided detection. IMPRESSION: No mammographic evidence of  malignancy. A result letter of this screening mammogram will be mailed directly to the patient. RECOMMENDATION: Screening mammogram in one year. (Code:SM-B-01Y) BI-RADS CATEGORY  1: Negative. Electronically Signed   By: Claudie Revering M.D.   On: 08/21/2020 16:42    Assessment & Plan:   Problem List Items Addressed This Visit     Hypothyroidism due to Hashimoto's thyroiditis - Primary   Relevant Orders   TSH (Completed)   Urine Microalbumin w/creat. ratio (Completed)   Major depressive disorder, recurrent episode ([place])    She is contemplative of starting lexapro       Relevant Medications   escitalopram (LEXAPRO) 10 MG tablet   Essential hypertension    she reports compliance with medication regimen  but has an elevated reading today in office.  She is not using NSAIDs daily.  Discussed goal of 120/70  (130/80 for patients over 70)  to preserve renal function.  She has been asked to check her  BP  at home and  submit readings for evaluation. Renal function, electrolytes and screen for proteinuria are all normal   Lab Results  Component Value Date   CREATININE 0.69 05/29/2021   Lab Results  Component Value Date   MICROALBUR <0.7 05/29/2021          Relevant Orders   Comp Met (CMET) (Completed)   Vitamin D deficiency   Relevant Orders   Vitamin D (25 hydroxy) (Completed)   OSA (obstructive sleep apnea)    She has not worn CPAP since she lost a significant amount of weight.  Since the weight regain she has not been noted to snore        Aortic atherosclerosis ([place])    Reviewed changes noted on 2018  Abdominal CT;  Not clear if minimal or moderate,  But she has hepatic steatosis, LDL > 170, hypertension and FH of CAD.  I have again recommended statin for primary prevention.  Lab Results  Component Value Date   CHOL 341 (H) 12/07/2020   HDL 44.80 12/07/2020   LDLCALC 170 (H) 08/18/2018   LDLDIRECT 246.0 12/07/2020   TRIG 225.0 (H) 12/07/2020   CHOLHDL 8 12/07/2020          Meds ordered this encounter  Medications   escitalopram (LEXAPRO) 10 MG tablet    Sig: Take 1 tablet (10 mg total) by mouth daily.    Dispense:  30 tablet    Refill:  2     I provided  30 minutes of  face-to-face time during this encounter reviewing patient's current problems and past surgeries, labs and imaging studies, providing counseling on the above mentioned problems , and coordination  of care .   Follow-up: Return in about 4 months (around 09/27/2021).   Crecencio Mc, MD

## 2021-05-29 NOTE — Patient Instructions (Addendum)
Check  your   BP daily over the next 2 weeks and send me readings   Please start the Lexapro (escitalopram) at 1/2 tablet daily in the morning with breakfast for the first few days to avoid nausea.  You can increase to a full tablet after 4 days if you have not developed side effects of nausea.  Once you start the atorvastatin  (in January) Please return for liver enzymes after 3-  4 weeks ,  O

## 2021-05-29 NOTE — Assessment & Plan Note (Addendum)
She has not worn CPAP since she lost a significant amount of weight.  Since the weight regain she has not been noted to snore

## 2021-05-30 LAB — COMPREHENSIVE METABOLIC PANEL
ALT: 12 U/L (ref 0–35)
AST: 16 U/L (ref 0–37)
Albumin: 4.3 g/dL (ref 3.5–5.2)
Alkaline Phosphatase: 53 U/L (ref 39–117)
BUN: 13 mg/dL (ref 6–23)
CO2: 30 mEq/L (ref 19–32)
Calcium: 9.5 mg/dL (ref 8.4–10.5)
Chloride: 101 mEq/L (ref 96–112)
Creatinine, Ser: 0.69 mg/dL (ref 0.40–1.20)
GFR: 99.33 mL/min (ref >60.00)
Glucose, Bld: 87 mg/dL (ref 70–99)
Potassium: 4.3 mEq/L (ref 3.5–5.1)
Sodium: 138 mEq/L (ref 135–145)
Total Bilirubin: 0.4 mg/dL (ref 0.2–1.2)
Total Protein: 7.6 g/dL (ref 6.0–8.3)

## 2021-05-30 LAB — TSH: TSH: 0.61 u[IU]/mL (ref 0.35–5.50)

## 2021-05-30 LAB — MICROALBUMIN / CREATININE URINE RATIO
Creatinine,U: 38.1 mg/dL
Microalb Creat Ratio: 1.8 mg/g (ref 0.0–30.0)
Microalb, Ur: <0.7 mg/dL (ref 0.0–1.9)

## 2021-05-30 LAB — VITAMIN D 25 HYDROXY (VIT D DEFICIENCY, FRACTURES): VITD: 25.23 ng/mL — ABNORMAL LOW (ref 30.00–100.00)

## 2021-05-30 NOTE — Assessment & Plan Note (Signed)
she reports compliance with medication regimen  but has an elevated reading today in office.  She is not using NSAIDs daily.  Discussed goal of 120/70  (130/80 for patients over 70)  to preserve renal function.  She has been asked to check her  BP  at home and  submit readings for evaluation. Renal function, electrolytes and screen for proteinuria are all normal   Lab Results  Component Value Date   CREATININE 0.69 05/29/2021   Lab Results  Component Value Date   MICROALBUR <0.7 05/29/2021

## 2021-05-30 NOTE — Assessment & Plan Note (Signed)
She is contemplative of starting lexapro

## 2021-05-31 NOTE — Assessment & Plan Note (Signed)
Reviewed changes noted on 2018  Abdominal CT;  Not clear if minimal or moderate,  But she has hepatic steatosis, LDL > 170, hypertension and FH of CAD.  I have again recommended statin for primary prevention.  Lab Results  Component Value Date   CHOL 341 (H) 12/07/2020   HDL 44.80 12/07/2020   LDLCALC 170 (H) 08/18/2018   LDLDIRECT 246.0 12/07/2020   TRIG 225.0 (H) 12/07/2020   CHOLHDL 8 12/07/2020

## 2021-06-01 ENCOUNTER — Other Ambulatory Visit: Payer: Self-pay | Admitting: Internal Medicine

## 2021-06-01 MED ORDER — LEVOTHYROXINE SODIUM 125 MCG PO TABS: ORAL_TABLET | ORAL | 1 refills | Status: DC

## 2021-07-25 ENCOUNTER — Ambulatory Visit: Payer: BC Managed Care – PPO | Admitting: Internal Medicine

## 2021-09-03 ENCOUNTER — Other Ambulatory Visit: Payer: Self-pay | Admitting: Internal Medicine

## 2021-09-12 ENCOUNTER — Ambulatory Visit: Payer: BC Managed Care – PPO | Admitting: Internal Medicine

## 2021-10-07 ENCOUNTER — Encounter: Payer: Self-pay | Admitting: Internal Medicine

## 2021-10-07 ENCOUNTER — Ambulatory Visit: Payer: BC Managed Care – PPO | Admitting: Internal Medicine

## 2021-10-07 VITALS — BP 136/88 | HR 60 | Temp 98.0°F | Ht 66.0 in | Wt 214.0 lb

## 2021-10-07 DIAGNOSIS — I700 Atherosclerosis of aorta: Secondary | ICD-10-CM

## 2021-10-07 DIAGNOSIS — E063 Autoimmune thyroiditis: Secondary | ICD-10-CM | POA: Diagnosis not present

## 2021-10-07 DIAGNOSIS — R7303 Prediabetes: Secondary | ICD-10-CM

## 2021-10-07 DIAGNOSIS — E038 Other specified hypothyroidism: Secondary | ICD-10-CM

## 2021-10-07 DIAGNOSIS — R351 Nocturia: Secondary | ICD-10-CM | POA: Diagnosis not present

## 2021-10-07 DIAGNOSIS — I10 Essential (primary) hypertension: Secondary | ICD-10-CM

## 2021-10-07 DIAGNOSIS — Z114 Encounter for screening for human immunodeficiency virus [HIV]: Secondary | ICD-10-CM

## 2021-10-07 DIAGNOSIS — E782 Mixed hyperlipidemia: Secondary | ICD-10-CM | POA: Diagnosis not present

## 2021-10-07 DIAGNOSIS — F331 Major depressive disorder, recurrent, moderate: Secondary | ICD-10-CM

## 2021-10-07 DIAGNOSIS — G4733 Obstructive sleep apnea (adult) (pediatric): Secondary | ICD-10-CM

## 2021-10-07 DIAGNOSIS — N951 Menopausal and female climacteric states: Secondary | ICD-10-CM

## 2021-10-07 LAB — URINALYSIS, ROUTINE W REFLEX MICROSCOPIC
Bilirubin Urine: NEGATIVE
Hgb urine dipstick: NEGATIVE
Ketones, ur: NEGATIVE
Leukocytes,Ua: NEGATIVE
Nitrite: NEGATIVE
RBC / HPF: NONE SEEN (ref 0–?)
Specific Gravity, Urine: 1.01 (ref 1.000–1.030)
Total Protein, Urine: NEGATIVE
Urine Glucose: NEGATIVE
Urobilinogen, UA: 0.2 (ref 0.0–1.0)
pH: 7 (ref 5.0–8.0)

## 2021-10-07 LAB — TSH: TSH: 1.3 u[IU]/mL (ref 0.35–5.50)

## 2021-10-07 LAB — COMPREHENSIVE METABOLIC PANEL
ALT: 14 U/L (ref 0–35)
AST: 17 U/L (ref 0–37)
Albumin: 4.5 g/dL (ref 3.5–5.2)
Alkaline Phosphatase: 54 U/L (ref 39–117)
BUN: 10 mg/dL (ref 6–23)
CO2: 28 mEq/L (ref 19–32)
Calcium: 9.4 mg/dL (ref 8.4–10.5)
Chloride: 102 mEq/L (ref 96–112)
Creatinine, Ser: 0.7 mg/dL (ref 0.40–1.20)
GFR: 98.74 mL/min (ref >60.00)
Glucose, Bld: 98 mg/dL (ref 70–99)
Potassium: 4.2 mEq/L (ref 3.5–5.1)
Sodium: 138 mEq/L (ref 135–145)
Total Bilirubin: 0.4 mg/dL (ref 0.2–1.2)
Total Protein: 7.8 g/dL (ref 6.0–8.3)

## 2021-10-07 LAB — LIPID PANEL
Cholesterol: 323 mg/dL — ABNORMAL HIGH (ref 0–200)
HDL: 50.8 mg/dL (ref >39.00)
NonHDL: 271.71
Total CHOL/HDL Ratio: 6
Triglycerides: 254 mg/dL — ABNORMAL HIGH (ref 0.0–149.0)
VLDL: 50.8 mg/dL — ABNORMAL HIGH (ref 0.0–40.0)

## 2021-10-07 LAB — HEMOGLOBIN A1C: Hgb A1c MFr Bld: 5.8 % (ref 4.6–6.5)

## 2021-10-07 LAB — LDL CHOLESTEROL, DIRECT: Direct LDL: 238 mg/dL

## 2021-10-07 MED ORDER — ALPRAZOLAM 0.25 MG PO TABS: 0.2500 mg | ORAL_TABLET | Freq: Every evening | ORAL | 5 refills | Status: DC | PRN

## 2021-10-07 MED ORDER — ATORVASTATIN CALCIUM 20 MG PO TABS
20.0000 mg | ORAL_TABLET | Freq: Every day | ORAL | 0 refills | Status: DC
Start: 2021-10-07 — End: 2022-01-09

## 2021-10-07 MED ORDER — T-RELIEF CBD+13 SL SUBL
SUBLINGUAL_TABLET | SUBLINGUAL | 0 refills | Status: AC
Start: 2021-10-07 — End: ?

## 2021-10-07 NOTE — Patient Instructions (Addendum)
Talk to Dr Graciela Husbands about the cholesterol /statin given the presence of aortic atherosclerosis on your 2018 abdominal CT ? ? ?Try suspending caffeine for a few nights to see if the urinary frequency improves  ? ?BP should be  130/80.  If not . Consider once daily diltiazem ? ? ?

## 2021-10-07 NOTE — Assessment & Plan Note (Addendum)
Checking a1c., urinalysis and culture.  Advised to suspend caffeine past 2 pm . Has OSA ?

## 2021-10-07 NOTE — Progress Notes (Signed)
? ?Subjective:  ?Patient ID: Brittany Brooks, female    DOB: 1965-11-08  Age: 56 y.o. MRN: [id number] ? ?CC: The primary encounter diagnosis was Essential hypertension. Diagnoses of Hypothyroidism due to Hashimoto's thyroiditis, Mixed hyperlipidemia, Pre-diabetes, Encounter for screening for HIV, Nocturia, Moderate episode of recurrent major depressive disorder ([place]), OSA (obstructive sleep apnea), Perimenopause, and Aortic atherosclerosis ([place]) were also pertinent to this visit. ? ? ?This visit occurred during the SARS-CoV-2 public health emergency.  Safety protocols were in place, including screening questions prior to the visit, additional usage of staff PPE, and extensive cleaning of exam room while observing appropriate contact time as indicated for disinfecting solutions.   ? ?HPI ?Brittany Brooks presents for  ?Chief Complaint  ?Patient presents with  ? Follow-up  ?  Follow up on hyperlipidemia, hypertension, and hypothyroidism  ? ? ?1)  HTN:  takes metoprolol only prn  for SVT  .  Home readings since last visit have been checked periodically and have been less labile since fully recovering from [place].  There have been no readings > 140/90 and none below 103 systolic .  Has not used metoprolol in 6 months ? ?2) GAD:  has stopped lexapro.  Exercising daily which has helped  using SL CBD tincture for anxiety ? ?3) Menopause:  no menses in 2 years  hot flashes have improved since starting an exercise program ? ?4) Nocturia:  2-3 voids per night.  Has caffeine at dinnertime in [employer] .  Trouble returning to sleep .  Uses alprazolam prn; last refill  was July 2022 per review of the [place] Controlled Substance Database ? ?5) aortic atherosclerosis:  she has deferred lipid therapy in favor of diet,  and is willing to consider start of therapy pending today's labs ? ?Outpatient Medications Prior to Visit  ?Medication Sig Dispense Refill  ? Calcium Citrate 200 MG TABS Take 1 tablet by mouth once daily    ? cholecalciferol  (VITAMIN D3) 25 MCG (1000 UNIT) tablet Take 1,000 Units by mouth daily.    ? LACTOBACILLUS PO Take 1 capsule by mouth daily.    ? levothyroxine (SYNTHROID) 125 MCG tablet TAKE 1 TABLET EVERY DAY ON EMPTY STOMACHWITH A GLASS OF WATER AT LEAST 30-60 MINBEFORE BREAKFAST 90 tablet 1  ? metoprolol succinate (TOPROL-XL) 50 MG 24 hr tablet Take 1 tablet (50 mg total) by mouth daily as needed. 30 tablet 6  ? Multiple Vitamin (MULTIVITAMIN) tablet Take 1 tablet by mouth daily.    ? Omega-3 Fatty Acids (FISH OIL) 1000 MG CAPS Take 1 capsule by mouth daily.    ? zinc gluconate 50 MG tablet Take 50 mg by mouth daily.    ? ALPRAZolam (XANAX) 0.25 MG tablet Take 1 tablet (0.25 mg total) by mouth at bedtime. 30 tablet 2  ? MAGNESIUM CITRATE PO Take 1 tablet by mouth three times a day    ? atorvastatin (LIPITOR) 20 MG tablet Take 1 tablet (20 mg total) by mouth daily. (Patient not taking: Reported on 05/29/2021) 90 tablet 0  ? escitalopram (LEXAPRO) 10 MG tablet Take 1 tablet (10 mg total) by mouth daily. (Patient not taking: Reported on 10/07/2021) 30 tablet 2  ? ?No facility-administered medications prior to visit.  ? ? ?Review of Systems; ? ?Patient denies headache, fevers, malaise, unintentional weight loss, skin rash, eye pain, sinus congestion and sinus pain, sore throat, dysphagia,  hemoptysis , cough, dyspnea, wheezing, chest pain, palpitations, orthopnea, edema, abdominal pain, nausea, melena, diarrhea, constipation,  flank pain, dysuria, hematuria, urinary  Frequency, nocturia, numbness, tingling, seizures,  Focal weakness, Loss of consciousness,  Tremor, insomnia, depression, anxiety, and suicidal ideation.   ? ? ? ?Objective:  ?BP 136/88 (BP Location: Left Arm, Patient Position: Sitting, Cuff Size: Large)   Pulse 60   Temp 98 ?F (36.7 ?C) (Oral)   Ht 5' 6"  (1.676 m)   Wt 214 lb (97.1 kg)   SpO2 97%   BMI 34.54 kg/m?  ? ?BP Readings from Last 3 Encounters:  ?10/07/21 136/88  ?05/29/21 (!) 158/110  ?12/13/20 118/74   ? ? ?Wt Readings from Last 3 Encounters:  ?10/07/21 214 lb (97.1 kg)  ?05/29/21 216 lb 3.2 oz (98.1 kg)  ?12/13/20 208 lb 3.2 oz (94.4 kg)  ? ? ?General appearance: alert, cooperative and appears stated age ?Ears: normal TM's and external ear canals both ears ?Throat: lips, mucosa, and tongue normal; teeth and gums normal ?Neck: no adenopathy, no carotid bruit, supple, symmetrical, trachea midline and thyroid not enlarged, symmetric, no tenderness/mass/nodules ?Back: symmetric, no curvature. ROM normal. No CVA tenderness. ?Lungs: clear to auscultation bilaterally ?Heart: regular rate and rhythm, S1, S2 normal, no murmur, click, rub or gallop ?Abdomen: soft, non-tender; bowel sounds normal; no masses,  no organomegaly ?Pulses: 2+ and symmetric ?Skin: Skin color, texture, turgor normal. No rashes or lesions ?Lymph nodes: Cervical, supraclavicular, and axillary nodes normal. ? ?Lab Results  ?Component Value Date  ? HGBA1C 5.8 10/07/2021  ? HGBA1C 6.0 12/07/2020  ? HGBA1C 5.8 08/18/2018  ? ? ?Lab Results  ?Component Value Date  ? CREATININE 0.70 10/07/2021  ? CREATININE 0.69 05/29/2021  ? CREATININE 0.74 12/07/2020  ? ? ?Lab Results  ?Component Value Date  ? WBC 7.3 02/10/2018  ? HGB 12.7 02/10/2018  ? HCT 38.0 02/10/2018  ? PLT 302 02/10/2018  ? GLUCOSE 98 10/07/2021  ? CHOL 323 (H) 10/07/2021  ? TRIG 254.0 (H) 10/07/2021  ? HDL 50.80 10/07/2021  ? LDLDIRECT 238.0 10/07/2021  ? LDLCALC 170 (H) 08/18/2018  ? ALT 14 10/07/2021  ? AST 17 10/07/2021  ? NA 138 10/07/2021  ? K 4.2 10/07/2021  ? CL 102 10/07/2021  ? CREATININE 0.70 10/07/2021  ? BUN 10 10/07/2021  ? CO2 28 10/07/2021  ? TSH 1.30 10/07/2021  ? HGBA1C 5.8 10/07/2021  ? MICROALBUR <0.7 05/29/2021  ? ? ?MM 3D SCREEN BREAST BILATERAL ? ?Result Date: 08/21/2020 ?CLINICAL DATA:  Screening. EXAM: DIGITAL SCREENING BILATERAL MAMMOGRAM WITH TOMOSYNTHESIS AND CAD TECHNIQUE: Bilateral screening digital craniocaudal and mediolateral oblique mammograms were obtained.  Bilateral screening digital breast tomosynthesis was performed. The images were evaluated with computer-aided detection. COMPARISON:  Previous exam(s). ACR Breast Density Category c: The breast tissue is heterogeneously dense, which may obscure small masses. FINDINGS: There are no findings suspicious for malignancy. The images were evaluated with computer-aided detection. IMPRESSION: No mammographic evidence of malignancy. A result letter of this screening mammogram will be mailed directly to the patient. RECOMMENDATION: Screening mammogram in one year. (Code:SM-B-01Y) BI-RADS CATEGORY  1: Negative. Electronically Signed   By: Claudie Revering M.D.   On: 08/21/2020 16:42  ? ? ?Assessment & Plan:  ? ?Problem List Items Addressed This Visit   ? ? Pre-diabetes  ?  A1c had been lowered to 5.8  with low glycemic index diet and regular participation in exercise   ? ? ?Lab Results  ?Component Value Date  ? HGBA1C 5.8 10/07/2021  ? ? ?  ?  ? Perimenopause  ?  Managing symptoms  without medications ? ?  ?  ? OSA (obstructive sleep apnea)  ?  Her elevated BP readings and nocturia may be indicative of recurrent OSA  ? ?  ?  ? Nocturia  ?  Checking a1c., urinalysis and culture.  Advised to suspend caffeine past 2 pm . Has OSA ? ?  ?  ? Relevant Orders  ? Urinalysis, Routine w reflex microscopic (Completed)  ? Urine Culture  ? Hemoglobin A1c (Completed)  ? Mixed hyperlipidemia  ?  She filled the atorvastatin but never started it.  She prefers a nonpharmacologic approach,  But has AA by 2018 CT abdomen.  Reviewed the indications for statin therapy ? ?  ?  ? Relevant Medications  ? atorvastatin (LIPITOR) 20 MG tablet  ? Other Relevant Orders  ? Lipid Profile (Completed)  ? Direct LDL (Completed)  ? Major depressive disorder, recurrent episode ([place])  ?  She has stopped lexapro since last visit  And denies any recurrent symptoms  ? ?  ?  ? Relevant Medications  ? ALPRAZolam (XANAX) 0.25 MG tablet  ? Hypothyroidism due to Hashimoto's  thyroiditis  ? Relevant Orders  ? TSH (Completed)  ? Essential hypertension - Primary  ? Relevant Medications  ? atorvastatin (LIPITOR) 20 MG tablet  ? Other Relevant Orders  ? Comp Met (CMET) (Completed)  ? Aortic ath

## 2021-10-07 NOTE — Assessment & Plan Note (Signed)
Reviewed changes noted on 2018  Abdominal CT;  Not clear if minimal or moderate,  But she has hepatic steatosis, LDLnow < 200, hypertension and FH of CAD.  I have again recommended high potency statin for primary prevention. ? ?Lab Results  ?Component Value Date  ? CHOL 323 (H) 10/07/2021  ? HDL 50.80 10/07/2021  ? LDLCALC 170 (H) 08/18/2018  ? LDLDIRECT 238.0 10/07/2021  ? TRIG 254.0 (H) 10/07/2021  ? CHOLHDL 6 10/07/2021  ? ? ?

## 2021-10-07 NOTE — Assessment & Plan Note (Addendum)
Her elevated BP readings and nocturia may be indicative of recurrent OSA  ?

## 2021-10-07 NOTE — Assessment & Plan Note (Signed)
She filled the atorvastatin but never started it.  She prefers a nonpharmacologic approach,  But has AA by 2018 CT abdomen.  Reviewed the indications for statin therapy ?

## 2021-10-07 NOTE — Assessment & Plan Note (Addendum)
She has stopped lexapro since last visit  And denies any recurrent symptoms  ?

## 2021-10-07 NOTE — Assessment & Plan Note (Signed)
Managing symptoms without medications ?

## 2021-10-07 NOTE — Assessment & Plan Note (Signed)
A1c had been lowered to 5.8  with low glycemic index diet and regular participation in exercise   ? ? ?Lab Results  ?Component Value Date  ? HGBA1C 5.8 10/07/2021  ? ? ?

## 2021-10-08 LAB — URINE CULTURE
MICRO NUMBER:: 13333575
SPECIMEN QUALITY:: ADEQUATE

## 2021-10-08 LAB — HIV ANTIBODY (ROUTINE TESTING W REFLEX): HIV 1&2 Ab, 4th Generation: NONREACTIVE

## 2021-10-09 ENCOUNTER — Encounter: Payer: Self-pay | Admitting: Internal Medicine

## 2021-10-10 NOTE — Telephone Encounter (Signed)
Pt called about mychart message regarding atorvastatin and crestor medication ?

## 2021-10-12 ENCOUNTER — Other Ambulatory Visit: Payer: Self-pay | Admitting: Internal Medicine

## 2021-10-12 DIAGNOSIS — E7800 Pure hypercholesterolemia, unspecified: Secondary | ICD-10-CM

## 2022-01-08 ENCOUNTER — Other Ambulatory Visit: Payer: Self-pay | Admitting: Internal Medicine

## 2022-01-18 IMAGING — MG MM DIGITAL SCREENING BILAT W/ TOMO AND CAD
6 of 10 series · 6 of 30 positions shown · non-contrast
Comparison: Previous exam(s).

CLINICAL DATA: Screening.

EXAM:
DIGITAL SCREENING BILATERAL MAMMOGRAM WITH TOMOSYNTHESIS AND CAD
TECHNIQUE: Bilateral screening digital craniocaudal and mediolateral oblique
mammograms were obtained. Bilateral screening digital breast
tomosynthesis was performed. The images were evaluated with
computer-aided detection.

[R MLO synth-2D]
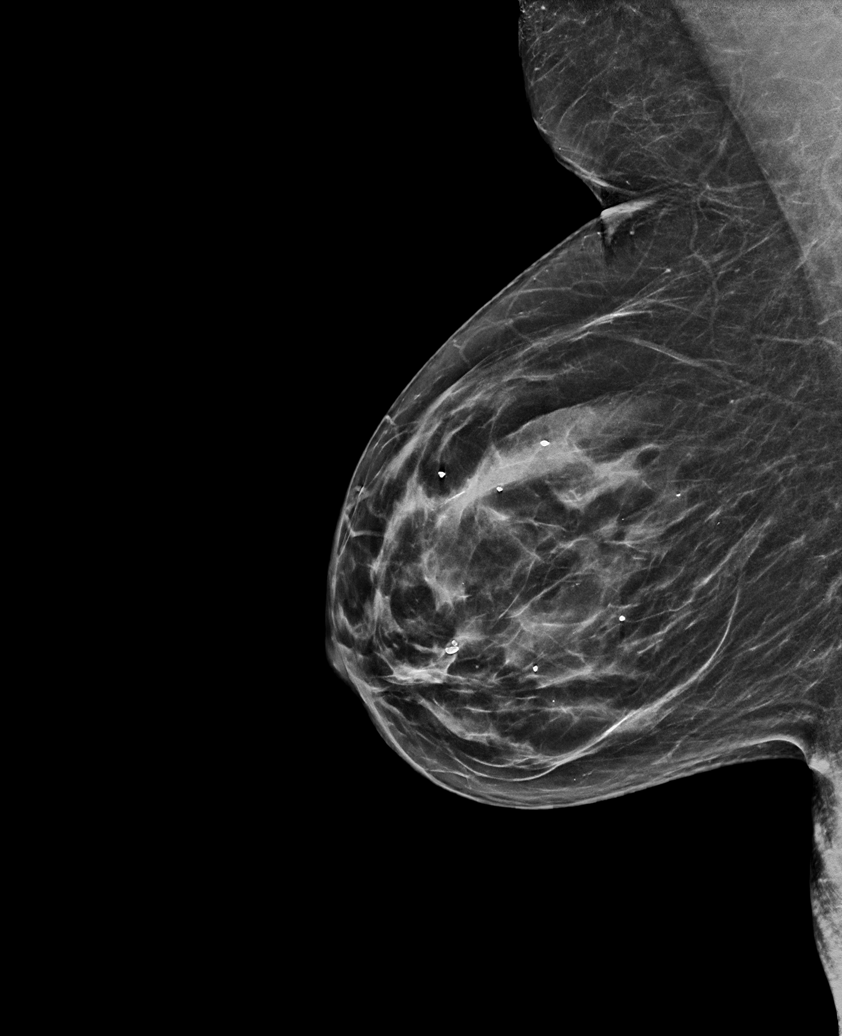

[R CC synth-2D]
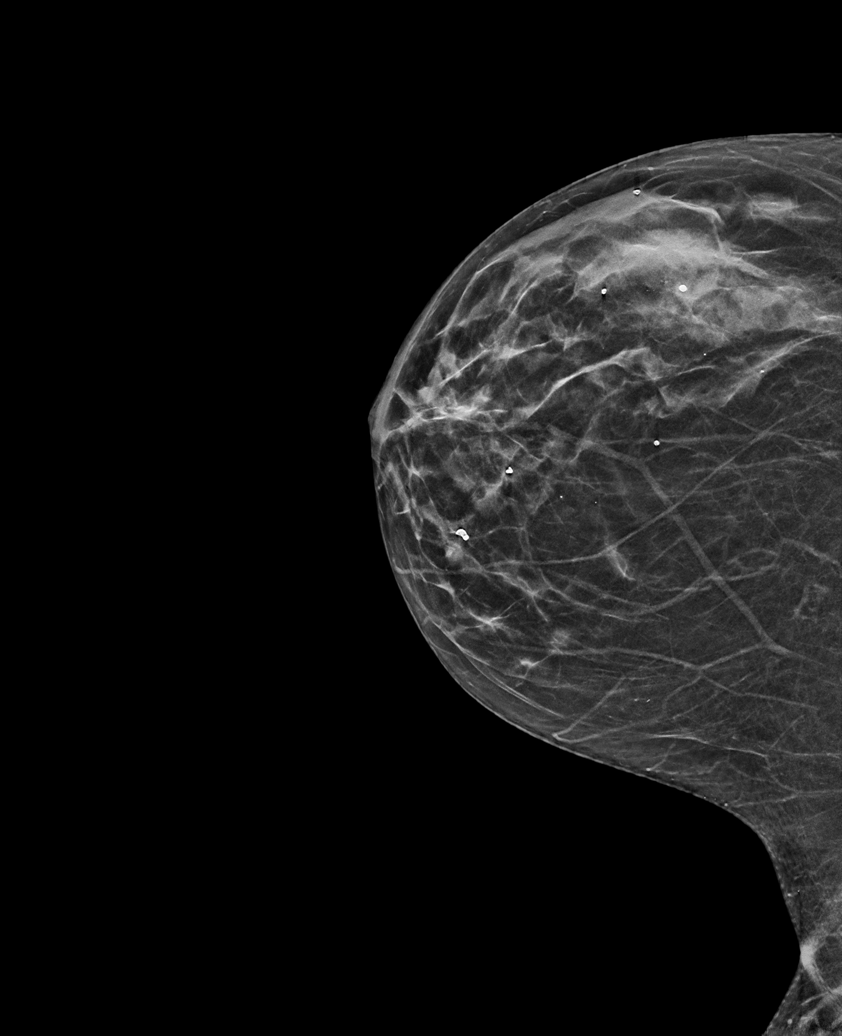

[R XCCL synth-2D]
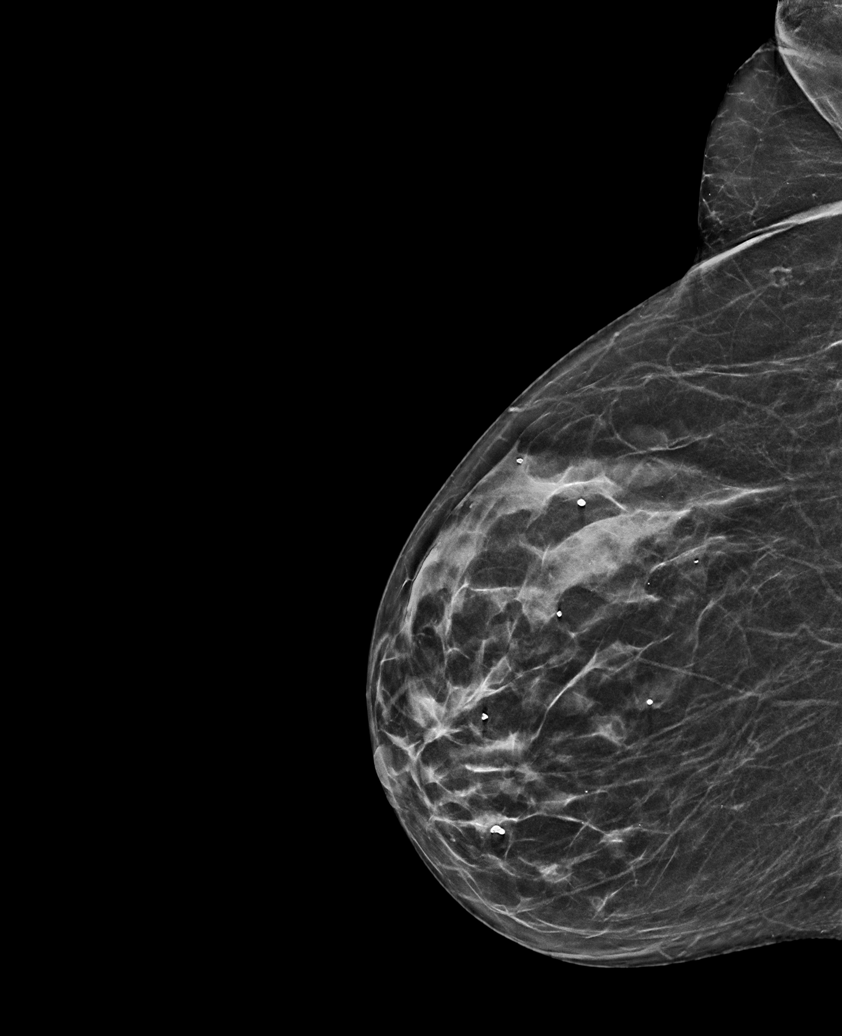

[L CC synth-2D]
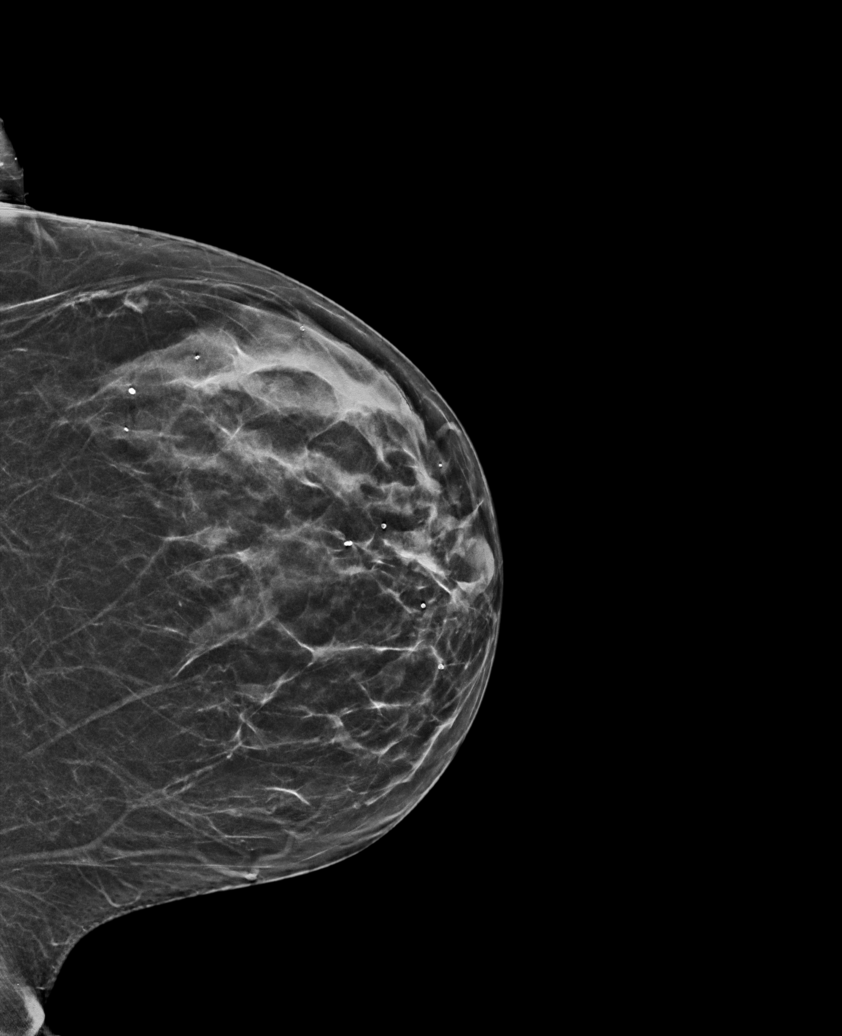

[L MLO synth-2D]
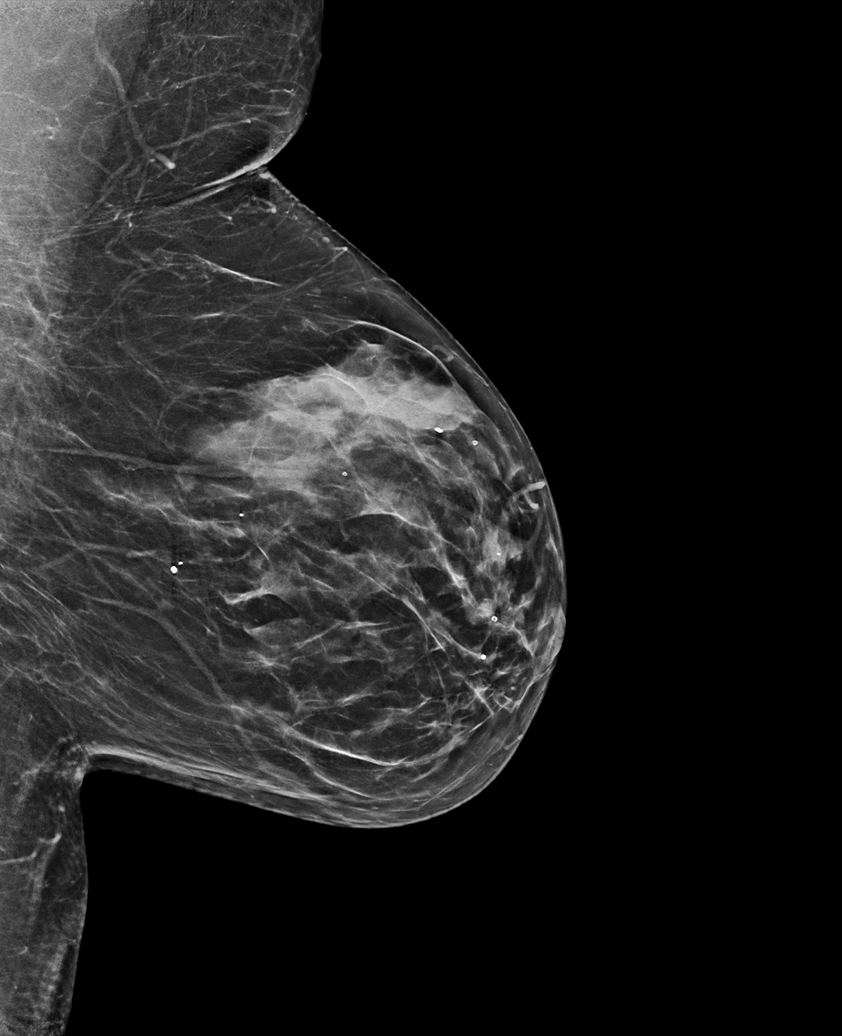

[R MLO tomo · tomo slice 30/59.0]
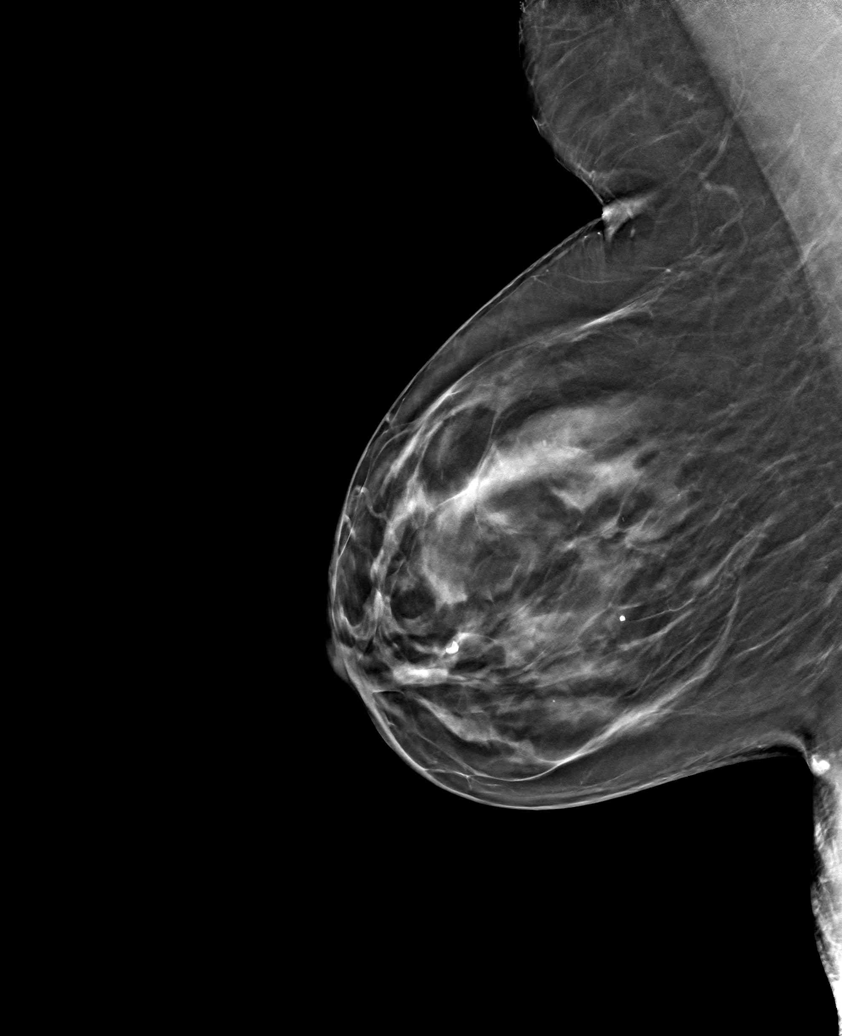

[6 of 30 positions shown; findings below may reference images not displayed]

ACR Breast Density Category c: The breast tissue is heterogeneously
dense, which may obscure small masses.
FINDINGS: There are no findings suspicious for malignancy. The images were
evaluated with computer-aided detection.
IMPRESSION: No mammographic evidence of malignancy. A result letter of this
screening mammogram will be mailed directly to the patient.

RECOMMENDATION:
Screening mammogram in one year. (Code:T4-5-GWO)

BI-RADS CATEGORY  1: Negative.

## 2022-02-07 ENCOUNTER — Other Ambulatory Visit: Payer: Self-pay | Admitting: Internal Medicine

## 2022-03-06 ENCOUNTER — Other Ambulatory Visit: Payer: Self-pay | Admitting: Internal Medicine

## 2022-04-01 ENCOUNTER — Telehealth: Payer: Self-pay

## 2022-04-01 DIAGNOSIS — I10 Essential (primary) hypertension: Secondary | ICD-10-CM

## 2022-04-01 DIAGNOSIS — R7303 Prediabetes: Secondary | ICD-10-CM

## 2022-04-01 DIAGNOSIS — E063 Autoimmune thyroiditis: Secondary | ICD-10-CM

## 2022-04-01 DIAGNOSIS — E782 Mixed hyperlipidemia: Secondary | ICD-10-CM

## 2022-04-01 DIAGNOSIS — Z79899 Other long term (current) drug therapy: Secondary | ICD-10-CM

## 2022-04-01 DIAGNOSIS — E559 Vitamin D deficiency, unspecified: Secondary | ICD-10-CM

## 2022-04-01 NOTE — Telephone Encounter (Signed)
Patient called to schedule her physical with Dr. Deborra Medina.  Patient states she would like to have her thyroid checked and her Vitamin D level in addition to any other labs Dr. Derrel Nip usually orders.  Patient states she would like to have her labs drawn the week before her appointment for her physical so they can discuss the results.  Patient has been scheduled for her physical on 05/09/2022.  The lab order for CMP is already in the system, so I scheduled a lab visit for patient on 05/05/2022.

## 2022-04-02 NOTE — Telephone Encounter (Signed)
Labs have been ordered

## 2022-04-05 DIAGNOSIS — B338 Other specified viral diseases: Secondary | ICD-10-CM | POA: Diagnosis not present

## 2022-04-05 DIAGNOSIS — Z03818 Encounter for observation for suspected exposure to other biological agents ruled out: Secondary | ICD-10-CM | POA: Diagnosis not present

## 2022-04-05 DIAGNOSIS — J019 Acute sinusitis, unspecified: Secondary | ICD-10-CM | POA: Diagnosis not present

## 2022-04-05 DIAGNOSIS — H66001 Acute suppurative otitis media without spontaneous rupture of ear drum, right ear: Secondary | ICD-10-CM | POA: Diagnosis not present

## 2022-05-05 ENCOUNTER — Other Ambulatory Visit (INDEPENDENT_AMBULATORY_CARE_PROVIDER_SITE_OTHER): Payer: BC Managed Care – PPO

## 2022-05-05 DIAGNOSIS — I10 Essential (primary) hypertension: Secondary | ICD-10-CM

## 2022-05-05 DIAGNOSIS — E559 Vitamin D deficiency, unspecified: Secondary | ICD-10-CM

## 2022-05-05 DIAGNOSIS — E038 Other specified hypothyroidism: Secondary | ICD-10-CM

## 2022-05-05 DIAGNOSIS — E782 Mixed hyperlipidemia: Secondary | ICD-10-CM

## 2022-05-05 DIAGNOSIS — E063 Autoimmune thyroiditis: Secondary | ICD-10-CM

## 2022-05-05 DIAGNOSIS — Z79899 Other long term (current) drug therapy: Secondary | ICD-10-CM

## 2022-05-05 DIAGNOSIS — R7303 Prediabetes: Secondary | ICD-10-CM

## 2022-05-05 LAB — CBC WITH DIFFERENTIAL/PLATELET
Basophils Absolute: 0 10*3/uL (ref 0.0–0.1)
Basophils Relative: 0.6 % (ref 0.0–3.0)
Eosinophils Absolute: 0.2 10*3/uL (ref 0.0–0.7)
Eosinophils Relative: 3.5 % (ref 0.0–5.0)
HCT: 37.1 % (ref 36.0–46.0)
Hemoglobin: 12.4 g/dL (ref 12.0–15.0)
Lymphocytes Relative: 25.8 % (ref 12.0–46.0)
Lymphs Abs: 1.6 10*3/uL (ref 0.7–4.0)
MCHC: 33.4 g/dL (ref 30.0–36.0)
MCV: 83.6 fl (ref 78.0–100.0)
Monocytes Absolute: 0.6 10*3/uL (ref 0.1–1.0)
Monocytes Relative: 9.6 % (ref 3.0–12.0)
Neutro Abs: 3.8 10*3/uL (ref 1.4–7.7)
Neutrophils Relative %: 60.5 % (ref 43.0–77.0)
Platelets: 281 10*3/uL (ref 150.0–400.0)
RBC: 4.44 Mil/uL (ref 3.87–5.11)
RDW: 14.1 % (ref 11.5–15.5)
WBC: 6.2 10*3/uL (ref 4.0–10.5)

## 2022-05-05 LAB — COMPREHENSIVE METABOLIC PANEL
ALT: 15 U/L (ref 0–35)
AST: 16 U/L (ref 0–37)
Albumin: 4.1 g/dL (ref 3.5–5.2)
Alkaline Phosphatase: 49 U/L (ref 39–117)
BUN: 11 mg/dL (ref 6–23)
CO2: 30 mEq/L (ref 19–32)
Calcium: 8.9 mg/dL (ref 8.4–10.5)
Chloride: 100 mEq/L (ref 96–112)
Creatinine, Ser: 0.73 mg/dL (ref 0.40–1.20)
GFR: 93.51 mL/min (ref >60.00)
Glucose, Bld: 92 mg/dL (ref 70–99)
Potassium: 4.1 mEq/L (ref 3.5–5.1)
Sodium: 136 mEq/L (ref 135–145)
Total Bilirubin: 0.3 mg/dL (ref 0.2–1.2)
Total Protein: 6.9 g/dL (ref 6.0–8.3)

## 2022-05-05 LAB — VITAMIN D 25 HYDROXY (VIT D DEFICIENCY, FRACTURES): VITD: 32.61 ng/mL (ref 30.00–100.00)

## 2022-05-05 LAB — TSH: TSH: 2.04 u[IU]/mL (ref 0.35–5.50)

## 2022-05-05 LAB — LIPID PANEL
Cholesterol: 321 mg/dL — ABNORMAL HIGH (ref 0–200)
HDL: 47.1 mg/dL (ref >39.00)
NonHDL: 274.3
Total CHOL/HDL Ratio: 7
Triglycerides: 208 mg/dL — ABNORMAL HIGH (ref 0.0–149.0)
VLDL: 41.6 mg/dL — ABNORMAL HIGH (ref 0.0–40.0)

## 2022-05-05 LAB — MICROALBUMIN / CREATININE URINE RATIO
Creatinine,U: 127.4 mg/dL
Microalb Creat Ratio: 0.5 mg/g (ref 0.0–30.0)
Microalb, Ur: <0.7 mg/dL (ref 0.0–1.9)

## 2022-05-05 LAB — HEMOGLOBIN A1C: Hgb A1c MFr Bld: 6.2 % (ref 4.6–6.5)

## 2022-05-05 LAB — LDL CHOLESTEROL, DIRECT: Direct LDL: 228 mg/dL

## 2022-05-09 ENCOUNTER — Ambulatory Visit (INDEPENDENT_AMBULATORY_CARE_PROVIDER_SITE_OTHER): Payer: BC Managed Care – PPO | Admitting: Internal Medicine

## 2022-05-09 ENCOUNTER — Encounter: Payer: Self-pay | Admitting: Internal Medicine

## 2022-05-09 VITALS — BP 128/70 | HR 75 | Temp 97.5°F | Ht 66.0 in | Wt 207.4 lb

## 2022-05-09 DIAGNOSIS — E063 Autoimmune thyroiditis: Secondary | ICD-10-CM

## 2022-05-09 DIAGNOSIS — Z0000 Encounter for general adult medical examination without abnormal findings: Secondary | ICD-10-CM

## 2022-05-09 DIAGNOSIS — I700 Atherosclerosis of aorta: Secondary | ICD-10-CM | POA: Diagnosis not present

## 2022-05-09 DIAGNOSIS — E038 Other specified hypothyroidism: Secondary | ICD-10-CM | POA: Diagnosis not present

## 2022-05-09 DIAGNOSIS — E782 Mixed hyperlipidemia: Secondary | ICD-10-CM | POA: Diagnosis not present

## 2022-05-09 DIAGNOSIS — I10 Essential (primary) hypertension: Secondary | ICD-10-CM

## 2022-05-09 DIAGNOSIS — E669 Obesity, unspecified: Secondary | ICD-10-CM

## 2022-05-09 MED ORDER — TRAZODONE HCL 50 MG PO TABS: 25.0000 mg | ORAL_TABLET | Freq: Every evening | ORAL | 3 refills | Status: DC | PRN

## 2022-05-09 MED ORDER — ALPRAZOLAM 0.25 MG PO TABS: 0.2500 mg | ORAL_TABLET | Freq: Every evening | ORAL | 5 refills | Status: DC | PRN

## 2022-05-09 NOTE — Patient Instructions (Addendum)
For your insomnia:  You might want to try using Relaxium for insomnia  (as seen on TV commercials) . It is available through [employer] and contains all natural supplements:  Melatonin 5 mg  Chamomile 25 mg Passionflower extract 75 mg GABA 100 mg Ashwaganda extract 125 mg Magnesium citrate, glycinate, oxide (100 mg)  L tryptophan 500 mg Valerest (proprietary  ingredient ; probably valeria root extract)    Plan B:  trazodone 50 mg 30 to 60 minutes before bedtime    Cranberry simple syrup:  1 cup water,  1 cup brown sugar 1/4 tsp ground cloves,  1/2 tsp ground allspice Stick of cinnamon Juice of one orange Orange TEFL teacher to simmer.  Add one package of fresh cranberries Turn heat off when cranberries start to "pop" Let cool.  Add gran marnier and pomegranate seeds  Will keep for 3 weeks.  Makes any drink awesome

## 2022-05-09 NOTE — Progress Notes (Unsigned)
The patient is here for annual preventive examination and management of other chronic and acute problems.   The risk factors are reflected in the social history.   The roster of all physicians providing medical care to patient - is listed in the Snapshot section of the chart.   Activities of daily living:  The patient is 100% independent in all ADLs: dressing, toileting, feeding as well as independent mobility   Home safety : The patient has smoke detectors in the home. They wear seatbelts.  There are no unsecured firearms at home. There is no violence in the home.    There is no risks for hepatitis, STDs or HIV. There is no   history of blood transfusion. They have no travel history to infectious disease endemic areas of the world.   The patient has seen their dentist in the last six month. They have seen their eye doctor in the last year. The patinet  denies slight hearing difficulty with regard to whispered voices and some television programs.  They have deferred audiologic testing in the last year.  They do not  have excessive sun exposure. Discussed the need for sun protection: hats, long sleeves and use of sunscreen if there is significant sun exposure.    Diet: the importance of a healthy diet is discussed. They do have a healthy diet.   The benefits of regular aerobic exercise were discussed. The patient  exercises  3 to 5 days per week  for  60 minutes.    Depression screen: there are no signs or vegative symptoms of depression- irritability, change in appetite, anhedonia, sadness/tearfullness.   The following portions of the patient's history were reviewed and updated as appropriate: allergies, current medications, past family history, past medical history,  past surgical history, past social history  and problem list.   Visual acuity was not assessed per patient preference since the patient has regular follow up with an  ophthalmologist. Hearing and body mass index were assessed and  reviewed.    During the course of the visit the patient was educated and counseled about appropriate screening and preventive services including : fall prevention , diabetes screening, nutrition counseling, colorectal cancer screening, and recommended immunizations.    Chief Complaint:   Treated by [facility] on 10/28 for RSV. Received only azithromax for concurrent sinusitis/otitis and prednisone 20 mg x 5 days, tessalon perles.  Symptoms of copious constant rhinorrhea and cough started on Wednesday,  Oct 25.  Took 3 weeks to resolve the fatigue and dyspnea.  .  Had a viral GI illness  several days prior while vacationing at [facility]  with "the girls" .    Insomnia: using alprazolam and CBD tincture  ,  wants an alternative   Review of Symptoms  Patient denies headache, fevers, malaise, unintentional weight loss, skin rash, eye pain, sinus congestion and sinus pain, sore throat, dysphagia,  hemoptysis , cough, dyspnea, wheezing, chest pain, palpitations, orthopnea, edema, abdominal pain, nausea, melena, diarrhea, constipation, flank pain, dysuria, hematuria, urinary  Frequency, nocturia, numbness, tingling, seizures,  Focal weakness, Loss of consciousness,  Tremor, insomnia, depression, anxiety, and suicidal ideation.    Physical Exam:  There were no vitals taken for this visit.   General Appearance:    Alert, cooperative, no distress, appears stated age  Head:    Normocephalic, without obvious abnormality, atraumatic  Eyes:    PERRL, conjunctiva/corneas clear, EOM's intact, fundi    benign, both eyes  Ears:  Normal TM's and external ear canals, both ears  Nose:   Nares normal, septum midline, mucosa normal, no drainage    or sinus tenderness  Throat:   Lips, mucosa, and tongue normal; teeth and gums normal  Neck:   Supple, symmetrical, trachea midline, no adenopathy;    thyroid:  no enlargement/tenderness/nodules; no carotid   bruit or JVD  Back:     Symmetric, no curvature,  ROM normal, no CVA tenderness  Lungs:     Clear to auscultation bilaterally, respirations unlabored  Chest Wall:    No tenderness or deformity   Heart:    Regular rate and rhythm, S1 and S2 normal, no murmur, rub   or gallop     Abdomen:     Soft, non-tender, bowel sounds active all four quadrants,    no masses, no organomegaly  Genitalia:    Deferred to GYN   Extremities:   Extremities normal, atraumatic, no cyanosis or edema  Pulses:   2+ and symmetric all extremities  Skin:   Skin color, texture, turgor normal, no rashes or lesions  Lymph nodes:   Cervical, supraclavicular, and axillary nodes normal  Neurologic:   CNII-XII intact, normal strength, sensation and reflexes    throughout     Assessment and Plan:  No problem-specific Assessment & Plan notes found for this encounter.   Updated Medication List Outpatient Encounter Medications as of 05/09/2022  Medication Sig   ALPRAZolam (XANAX) 0.25 MG tablet Take 1 tablet (0.25 mg total) by mouth at bedtime as needed for anxiety.   atorvastatin (LIPITOR) 20 MG tablet TAKE 1 TABLET BY MOUTH ONCE DAILY   Calcium Citrate 200 MG TABS Take 1 tablet by mouth once daily   cholecalciferol (VITAMIN D3) 25 MCG (1000 UNIT) tablet Take 1,000 Units by mouth daily.   LACTOBACILLUS PO Take 1 capsule by mouth daily.   levothyroxine (SYNTHROID) 125 MCG tablet TAKE 1 TABLET EVERY DAY ON EMPTY STOMACHWITH A GLASS OF WATER AT LEAST 30-60 MINBEFORE BREAKFAST   metoprolol succinate (TOPROL-XL) 50 MG 24 hr tablet Take 1 tablet (50 mg total) by mouth daily as needed.   Misc Natural Products (T-RELIEF CBD+13) SUBL One tablet as needed for anxiety   Multiple Vitamin (MULTIVITAMIN) tablet Take 1 tablet by mouth daily.   Omega-3 Fatty Acids (FISH OIL) 1000 MG CAPS Take 1 capsule by mouth daily.   zinc gluconate 50 MG tablet Take 50 mg by mouth daily.   No facility-administered encounter medications on file as of 05/09/2022.

## 2022-05-09 NOTE — Assessment & Plan Note (Signed)
Levels suggest a familial hyperlipidemia.  She filled the atorvastatin but never started it.  She prefers a nonpharmacologic approach,  But has AA by 2018 CT abdomen.  I again Reviewed the indications for statin therapy  Lab Results  Component Value Date   CHOL 321 (H) 05/05/2022   HDL 47.10 05/05/2022   LDLCALC 170 (H) 08/18/2018   LDLDIRECT 228.0 05/05/2022   TRIG 208.0 (H) 05/05/2022   CHOLHDL 7 05/05/2022

## 2022-05-11 NOTE — Assessment & Plan Note (Signed)
Thyroid function is at goal on 125 mcg daily; no changes today  Lab Results  Component Value Date   TSH 2.04 05/05/2022

## 2022-05-11 NOTE — Assessment & Plan Note (Signed)
she reports compliance with medication regimen and readings are at goal on metoprolol.  Renal function, electrolytes and screen for proteinuria are all normal   Lab Results  Component Value Date   CREATININE 0.73 05/05/2022   Lab Results  Component Value Date   MICROALBUR <0.7 05/05/2022   MICROALBUR <0.7 05/29/2021

## 2022-05-11 NOTE — Assessment & Plan Note (Signed)
Reviewed changes noted on 2018  Abdominal CT; history of hepatic steatosis, hyperlipidemia, hypertension and FH of CAD.  I have again recommended high potency statin for primary prevention and she is now willing to start high potency statin .  Lab Results  Component Value Date   CHOL 321 (H) 05/05/2022   HDL 47.10 05/05/2022   LDLCALC 170 (H) 08/18/2018   LDLDIRECT 228.0 05/05/2022   TRIG 208.0 (H) 05/05/2022   CHOLHDL 7 05/05/2022

## 2022-05-11 NOTE — Assessment & Plan Note (Signed)

## 2022-06-06 ENCOUNTER — Other Ambulatory Visit (INDEPENDENT_AMBULATORY_CARE_PROVIDER_SITE_OTHER): Payer: BC Managed Care – PPO

## 2022-06-06 DIAGNOSIS — E782 Mixed hyperlipidemia: Secondary | ICD-10-CM

## 2022-06-06 LAB — COMPREHENSIVE METABOLIC PANEL
ALT: 19 U/L (ref 0–35)
AST: 19 U/L (ref 0–37)
Albumin: 4.2 g/dL (ref 3.5–5.2)
Alkaline Phosphatase: 54 U/L (ref 39–117)
BUN: 17 mg/dL (ref 6–23)
CO2: 29 mEq/L (ref 19–32)
Calcium: 9.6 mg/dL (ref 8.4–10.5)
Chloride: 102 mEq/L (ref 96–112)
Creatinine, Ser: 0.65 mg/dL (ref 0.40–1.20)
GFR: 100.05 mL/min (ref >60.00)
Glucose, Bld: 92 mg/dL (ref 70–99)
Potassium: 4.1 mEq/L (ref 3.5–5.1)
Sodium: 138 mEq/L (ref 135–145)
Total Bilirubin: 0.3 mg/dL (ref 0.2–1.2)
Total Protein: 7.3 g/dL (ref 6.0–8.3)

## 2022-09-09 ENCOUNTER — Other Ambulatory Visit: Payer: Self-pay | Admitting: Internal Medicine

## 2022-10-09 ENCOUNTER — Telehealth: Payer: Self-pay | Admitting: Internal Medicine

## 2022-10-09 DIAGNOSIS — E038 Other specified hypothyroidism: Secondary | ICD-10-CM

## 2022-10-09 DIAGNOSIS — E782 Mixed hyperlipidemia: Secondary | ICD-10-CM

## 2022-10-09 DIAGNOSIS — R7303 Prediabetes: Secondary | ICD-10-CM

## 2022-10-09 NOTE — Telephone Encounter (Signed)
Patient made a 35m appointment for 11/10/2022. Does she need labs?

## 2022-10-10 NOTE — Addendum Note (Signed)
Addended by: Benedict Needy on: 10/10/2022 09:11 AM   Modules accepted: Orders

## 2022-10-10 NOTE — Telephone Encounter (Signed)
Please schedule pt for fasting labs before next appointment.  I've entered the orders.  Thank you!

## 2022-11-10 ENCOUNTER — Ambulatory Visit: Payer: 59 | Admitting: Internal Medicine

## 2022-11-10 ENCOUNTER — Encounter: Payer: Self-pay | Admitting: Internal Medicine

## 2022-11-10 VITALS — BP 150/74 | HR 70 | Temp 98.4°F | Ht 66.0 in | Wt 218.4 lb

## 2022-11-10 DIAGNOSIS — E063 Autoimmune thyroiditis: Secondary | ICD-10-CM

## 2022-11-10 DIAGNOSIS — I471 Supraventricular tachycardia, unspecified: Secondary | ICD-10-CM

## 2022-11-10 DIAGNOSIS — I700 Atherosclerosis of aorta: Secondary | ICD-10-CM | POA: Diagnosis not present

## 2022-11-10 DIAGNOSIS — R7303 Prediabetes: Secondary | ICD-10-CM

## 2022-11-10 DIAGNOSIS — E038 Other specified hypothyroidism: Secondary | ICD-10-CM

## 2022-11-10 DIAGNOSIS — E782 Mixed hyperlipidemia: Secondary | ICD-10-CM

## 2022-11-10 DIAGNOSIS — F331 Major depressive disorder, recurrent, moderate: Secondary | ICD-10-CM

## 2022-11-10 DIAGNOSIS — I10 Essential (primary) hypertension: Secondary | ICD-10-CM

## 2022-11-10 DIAGNOSIS — N951 Menopausal and female climacteric states: Secondary | ICD-10-CM

## 2022-11-10 DIAGNOSIS — Z1231 Encounter for screening mammogram for malignant neoplasm of breast: Secondary | ICD-10-CM

## 2022-11-10 MED ORDER — TRIAMTERENE-HCTZ 37.5-25 MG PO TABS
1.0000 | ORAL_TABLET | Freq: Every day | ORAL | 3 refills | Status: DC
Start: 2022-11-10 — End: 2022-12-23

## 2022-11-10 MED ORDER — ATORVASTATIN CALCIUM 20 MG PO TABS: 20.0000 mg | ORAL_TABLET | Freq: Every day | ORAL | 3 refills | Status: DC

## 2022-11-10 MED ORDER — ALPRAZOLAM 0.25 MG PO TABS: 0.2500 mg | ORAL_TABLET | Freq: Every evening | ORAL | 2 refills | Status: DC | PRN

## 2022-11-10 MED ORDER — METOPROLOL TARTRATE 50 MG PO TABS: 50.0000 mg | ORAL_TABLET | Freq: Two times a day (BID) | ORAL | 2 refills | Status: DC | PRN

## 2022-11-10 NOTE — Patient Instructions (Addendum)
Starting maxzide  for blood pressure,  one daily morning dose . Works by [employer]  After one week start checking BP daily  and send me  7 days of reading   You can still use metoprolol as needed,  (shorter acting one prescribed(  I will do your PAP smear in a 4 weeks

## 2022-11-10 NOTE — Progress Notes (Unsigned)
Subjective:  Patient ID: Brittany Brooks, female    DOB: Sep 17, 1965  Age: 57 y.o. MRN: [id number]  CC: The primary encounter diagnosis was Encounter for screening mammogram for malignant neoplasm of breast. Diagnoses of Mixed hyperlipidemia, Hypothyroidism due to Hashimoto's thyroiditis, Pre-diabetes, Aortic atherosclerosis (HCC), Essential hypertension, and Moderate episode of recurrent major depressive disorder (HCC) were also pertinent to this visit.   HPI Brittany Brooks presents for  Chief Complaint  Patient presents with   Medical Management of Chronic Issues    6 month follow up    1) HTN:  taking metoprolol succinate 50 mg prn heart palpitations,   not for BP   Home readings have been very elevated   2) Menopause :  having "weird symptoms":  nipple tenderness for several weeks,  now  improved.   Vaginal discharge resolved with Vagisil.  Some hot flashes .trouble sleeping .wants to start HRT.  Last period  3 years ago.   3) irregular heart rhythm . Friday night after a stressful week.  Took a dose of  metoprolol ,  took at least an hour to work.   Does not take it daily bc it makes her too tired.  The arrhythmia has not been captured on 3 week Holter monitor. Makes her light headed but never syncopal   4)  infrequent migraines with aura last one occurred while driving home from beach  previous one 6 months ago. Took tylenol,    Outpatient Medications Prior to Visit  Medication Sig Dispense Refill   Calcium Citrate 200 MG TABS Take 1 tablet by mouth once daily     cholecalciferol (VITAMIN D3) 25 MCG (1000 UNIT) tablet Take 1,000 Units by mouth daily.     LACTOBACILLUS PO Take 1 capsule by mouth daily.     levothyroxine (SYNTHROID) 125 MCG tablet TAKE 1 TABLET BY MOUTH ONCE DAILY. TAKE ON EMPTY STOMACH WITH A GLASS OF WATER AT LEAST 30-60 MINUTES BEFORE BREAKFAST 90 tablet 1   Misc Natural Products (T-RELIEF CBD+13) SUBL One tablet as needed for anxiety 90 tablet 0   Multiple Vitamin  (MULTIVITAMIN) tablet Take 1 tablet by mouth daily.     traZODone (DESYREL) 50 MG tablet Take 0.5-1 tablets (25-50 mg total) by mouth at bedtime as needed for sleep. 30 tablet 3   zinc gluconate 50 MG tablet Take 50 mg by mouth daily.     ALPRAZolam (XANAX) 0.25 MG tablet Take 1 tablet (0.25 mg total) by mouth at bedtime as needed for anxiety. 30 tablet 5   atorvastatin (LIPITOR) 20 MG tablet TAKE 1 TABLET BY MOUTH ONCE DAILY 90 tablet 0   metoprolol succinate (TOPROL-XL) 50 MG 24 hr tablet Take 1 tablet (50 mg total) by mouth daily as needed. 30 tablet 6   Omega-3 Fatty Acids (FISH OIL) 1000 MG CAPS Take 1 capsule by mouth daily. (Patient not taking: Reported on 11/10/2022)     No facility-administered medications prior to visit.    Review of Systems;  Patient denies  fevers, malaise, unintentional weight loss, skin rash, eye pain, sinus congestion and sinus pain, sore throat, dysphagia,  hemoptysis , cough, dyspnea, wheezing, chest pain, palpitations, orthopnea, edema, abdominal pain, nausea, melena, diarrhea, constipation, flank pain, dysuria, hematuria, urinary  Frequency, nocturia, numbness, tingling, seizures,  Focal weakness, Loss of consciousness,  Tremor, insomnia, depression, anxiety, and suicidal ideation.      Objective:  BP (!) 150/74   Pulse 70   Temp 98.4 F (36.9 C) (Oral)  Ht 5\' 6"  (1.676 m)   Wt 218 lb 6.4 oz (99.1 kg)   SpO2 98%   BMI 35.25 kg/m   BP Readings from Last 3 Encounters:  11/10/22 (!) 150/74  05/09/22 128/70  10/07/21 136/88    Wt Readings from Last 3 Encounters:  11/10/22 218 lb 6.4 oz (99.1 kg)  05/09/22 207 lb 6.4 oz (94.1 kg)  10/07/21 214 lb (97.1 kg)    Physical Exam Vitals reviewed.  Constitutional:      General: She is not in acute distress.    Appearance: Normal appearance. She is normal weight. She is not ill-appearing, toxic-appearing or diaphoretic.  HENT:     Head: Normocephalic.  Eyes:     General: No scleral icterus.        Right eye: No discharge.        Left eye: No discharge.     Conjunctiva/sclera: Conjunctivae normal.  Cardiovascular:     Rate and Rhythm: Normal rate and regular rhythm.     Heart sounds: Normal heart sounds.  Pulmonary:     Effort: Pulmonary effort is normal. No respiratory distress.     Breath sounds: Normal breath sounds.  Musculoskeletal:        General: Normal range of motion.  Skin:    General: Skin is warm and dry.  Neurological:     General: No focal deficit present.     Mental Status: She is alert and oriented to person, place, and time. Mental status is at baseline.  Psychiatric:        Mood and Affect: Mood normal.        Behavior: Behavior normal.        Thought Content: Thought content normal.        Judgment: Judgment normal.   Lab Results  Component Value Date   HGBA1C 5.8 11/10/2022   HGBA1C 6.2 05/05/2022   HGBA1C 5.8 10/07/2021    Lab Results  Component Value Date   CREATININE 0.67 11/10/2022   CREATININE 0.65 06/06/2022   CREATININE 0.73 05/05/2022    Lab Results  Component Value Date   WBC 6.2 05/05/2022   HGB 12.4 05/05/2022   HCT 37.1 05/05/2022   PLT 281.0 05/05/2022   GLUCOSE 90 11/10/2022   CHOL 254 (H) 11/10/2022   TRIG 347.0 (H) 11/10/2022   HDL 43.70 11/10/2022   LDLDIRECT 176.0 11/10/2022   LDLCALC 170 (H) 08/18/2018   ALT 15 11/10/2022   AST 15 11/10/2022   NA 139 11/10/2022   K 4.1 11/10/2022   CL 103 11/10/2022   CREATININE 0.67 11/10/2022   BUN 17 11/10/2022   CO2 29 11/10/2022   TSH 2.73 11/10/2022   HGBA1C 5.8 11/10/2022   MICROALBUR <0.7 05/05/2022    MM 3D SCREEN BREAST BILATERAL  Result Date: 08/21/2020 CLINICAL DATA:  Screening. EXAM: DIGITAL SCREENING BILATERAL MAMMOGRAM WITH TOMOSYNTHESIS AND CAD TECHNIQUE: Bilateral screening digital craniocaudal and mediolateral oblique mammograms were obtained. Bilateral screening digital breast tomosynthesis was performed. The images were evaluated with computer-aided  detection. COMPARISON:  Previous exam(s). ACR Breast Density Category c: The breast tissue is heterogeneously dense, which may obscure small masses. FINDINGS: There are no findings suspicious for malignancy. The images were evaluated with computer-aided detection. IMPRESSION: No mammographic evidence of malignancy. A result letter of this screening mammogram will be mailed directly to the patient. RECOMMENDATION: Screening mammogram in one year. (Code:SM-B-01Y) BI-RADS CATEGORY  1: Negative. Electronically Signed   By: Beckie Salts M.D.   On:  08/21/2020 16:42    Assessment & Plan:  .Encounter for screening mammogram for malignant neoplasm of breast -     3D Screening Mammogram, Left and Right; Future  Mixed hyperlipidemia -     LDL cholesterol, direct -     Lipid panel -     Comprehensive metabolic panel  Hypothyroidism due to Hashimoto's thyroiditis Assessment & Plan: Thyroid function is at goal on 125 mcg daily; no changes today  Lab Results  Component Value Date   TSH 2.73 11/10/2022     Orders: -     TSH  Pre-diabetes Assessment & Plan: A1c had been lowered to 5.8  with low glycemic index diet and regular participation in exercise     Lab Results  Component Value Date   HGBA1C 5.8 11/10/2022     Orders: -     Hemoglobin A1c  Aortic atherosclerosis (HCC) Assessment & Plan: Reviewed changes noted on 2018  Abdominal CT; history of hepatic steatosis, hyperlipidemia, hypertension and FH of CAD.   She is tolerating  high potency statin .for primary prevention   Lab Results  Component Value Date   CHOL 254 (H) 11/10/2022   HDL 43.70 11/10/2022   LDLCALC 170 (H) 08/18/2018   LDLDIRECT 176.0 11/10/2022   TRIG 347.0 (H) 11/10/2022   CHOLHDL 6 11/10/2022      Essential hypertension Assessment & Plan: Starting maxzide .    She is using metoprolol prn palpitations   Lab Results  Component Value Date   MICROALBUR <0.7 05/05/2022   MICROALBUR <0.7 05/29/2021         Moderate episode of recurrent major depressive disorder [facility]) Assessment & Plan: She has stopped lexapro since last visit  And denies any recurrent symptoms    Other orders -     Atorvastatin Calcium; Take 1 tablet (20 mg total) by mouth daily.  Dispense: 90 tablet; Refill: 3 -     Triamterene-HCTZ; Take 1 tablet by mouth daily.  Dispense: 90 tablet; Refill: 3 -     Metoprolol Tartrate; Take 1 tablet (50 mg total) by mouth 2 (two) times daily as needed. For rapid heart rate  Dispense: 60 tablet; Refill: 2 -     ALPRAZolam; Take 1 tablet (0.25 mg total) by mouth at bedtime as needed for anxiety.  Dispense: 30 tablet; Refill: 2     I provided 30 minutes of face-to-face time during this encounter reviewing patient's last visit with me, patient's  most recent visit with cardiology,  nephrology,  and neurology,  recent surgical and non surgical procedures, previous  labs and imaging studies, counseling on currently addressed issues,  and post visit ordering to diagnostics and therapeutics .   Follow-up: Return in about 4 weeks (around 12/08/2022) for physical.   Sherlene Shams, MD

## 2022-11-11 ENCOUNTER — Encounter: Payer: Self-pay | Admitting: Internal Medicine

## 2022-11-11 DIAGNOSIS — I471 Supraventricular tachycardia, unspecified: Secondary | ICD-10-CM | POA: Insufficient documentation

## 2022-11-11 LAB — COMPREHENSIVE METABOLIC PANEL
ALT: 15 U/L (ref 0–35)
AST: 15 U/L (ref 0–37)
Albumin: 4.3 g/dL (ref 3.5–5.2)
Alkaline Phosphatase: 52 U/L (ref 39–117)
BUN: 17 mg/dL (ref 6–23)
CO2: 29 mEq/L (ref 19–32)
Calcium: 9.1 mg/dL (ref 8.4–10.5)
Chloride: 103 mEq/L (ref 96–112)
Creatinine, Ser: 0.67 mg/dL (ref 0.40–1.20)
GFR: 99.02 mL/min (ref >60.00)
Glucose, Bld: 90 mg/dL (ref 70–99)
Potassium: 4.1 mEq/L (ref 3.5–5.1)
Sodium: 139 mEq/L (ref 135–145)
Total Bilirubin: 0.3 mg/dL (ref 0.2–1.2)
Total Protein: 7.4 g/dL (ref 6.0–8.3)

## 2022-11-11 LAB — LIPID PANEL
Cholesterol: 254 mg/dL — ABNORMAL HIGH (ref 0–200)
HDL: 43.7 mg/dL (ref >39.00)
NonHDL: 210.58
Total CHOL/HDL Ratio: 6
Triglycerides: 347 mg/dL — ABNORMAL HIGH (ref 0.0–149.0)
VLDL: 69.4 mg/dL — ABNORMAL HIGH (ref 0.0–40.0)

## 2022-11-11 LAB — TSH: TSH: 2.73 u[IU]/mL (ref 0.35–5.50)

## 2022-11-11 LAB — HEMOGLOBIN A1C: Hgb A1c MFr Bld: 5.8 % (ref 4.6–6.5)

## 2022-11-11 LAB — LDL CHOLESTEROL, DIRECT: Direct LDL: 176 mg/dL

## 2022-11-11 NOTE — Assessment & Plan Note (Signed)
No obvious triggers excpet emotional stress.  Short acting metoprolol refilled for prn use.

## 2022-11-11 NOTE — Assessment & Plan Note (Signed)
Reviewed changes noted on 2018  Abdominal CT; history of hepatic steatosis, hyperlipidemia, hypertension and FH of CAD.   She is tolerating  high potency statin .for primary prevention   Lab Results  Component Value Date   CHOL 254 (H) 11/10/2022   HDL 43.70 11/10/2022   LDLCALC 170 (H) 08/18/2018   LDLDIRECT 176.0 11/10/2022   TRIG 347.0 (H) 11/10/2022   CHOLHDL 6 11/10/2022

## 2022-11-11 NOTE — Assessment & Plan Note (Signed)
With recent episode of breast tenderness, now resolved.  Will consider HRT once her BP Is controlled.    Lab Results  Component Value Date   TSH 2.73 11/10/2022

## 2022-11-11 NOTE — Assessment & Plan Note (Signed)
Thyroid function is at goal on 125 mcg daily; no changes today  Lab Results  Component Value Date   TSH 2.73 11/10/2022

## 2022-11-11 NOTE — Assessment & Plan Note (Signed)
She has stopped lexapro since last visit  And denies any recurrent symptoms  ?

## 2022-11-11 NOTE — Assessment & Plan Note (Addendum)
Starting maxzide .    She is using metoprolol prn palpitations   Lab Results  Component Value Date   MICROALBUR <0.7 05/05/2022   MICROALBUR <0.7 05/29/2021

## 2022-11-11 NOTE — Assessment & Plan Note (Signed)
A1c had been lowered to 5.8  with low glycemic index diet and regular participation in exercise     Lab Results  Component Value Date   HGBA1C 5.8 11/10/2022

## 2022-11-12 ENCOUNTER — Encounter: Payer: Self-pay | Admitting: Internal Medicine

## 2022-12-22 ENCOUNTER — Ambulatory Visit (INDEPENDENT_AMBULATORY_CARE_PROVIDER_SITE_OTHER): Payer: 59 | Admitting: Internal Medicine

## 2022-12-22 ENCOUNTER — Encounter: Payer: Self-pay | Admitting: Internal Medicine

## 2022-12-22 ENCOUNTER — Other Ambulatory Visit (HOSPITAL_COMMUNITY)
Admission: RE | Admit: 2022-12-22 | Discharge: 2022-12-22 | Disposition: A | Discharge status: Home / Self Care | Payer: 59 | Source: Ambulatory Visit | Attending: Internal Medicine | Admitting: Internal Medicine

## 2022-12-22 VITALS — BP 128/80 | HR 68 | Temp 97.9°F | Ht 66.0 in | Wt 215.6 lb

## 2022-12-22 DIAGNOSIS — Z780 Asymptomatic menopausal state: Secondary | ICD-10-CM

## 2022-12-22 DIAGNOSIS — E063 Autoimmune thyroiditis: Secondary | ICD-10-CM

## 2022-12-22 DIAGNOSIS — E038 Other specified hypothyroidism: Secondary | ICD-10-CM | POA: Diagnosis not present

## 2022-12-22 DIAGNOSIS — Z124 Encounter for screening for malignant neoplasm of cervix: Secondary | ICD-10-CM | POA: Insufficient documentation

## 2022-12-22 DIAGNOSIS — J329 Chronic sinusitis, unspecified: Secondary | ICD-10-CM | POA: Diagnosis not present

## 2022-12-22 DIAGNOSIS — I10 Essential (primary) hypertension: Secondary | ICD-10-CM

## 2022-12-22 DIAGNOSIS — Z1211 Encounter for screening for malignant neoplasm of colon: Secondary | ICD-10-CM

## 2022-12-22 MED ORDER — PROGESTERONE MICRONIZED 100 MG PO CAPS: 100.0000 mg | ORAL_CAPSULE | Freq: Every evening | ORAL | 2 refills | Status: DC

## 2022-12-22 MED ORDER — TRAZODONE HCL 50 MG PO TABS: 25.0000 mg | ORAL_TABLET | Freq: Every evening | ORAL | 3 refills | Status: DC | PRN

## 2022-12-22 MED ORDER — LEVOTHYROXINE SODIUM 125 MCG PO TABS: ORAL_TABLET | ORAL | 1 refills | Status: DC

## 2022-12-22 NOTE — Progress Notes (Unsigned)
Patient ID: Brittany Brooks, female    DOB: [birth year]  Age: 57 y.o. MRN: [id number]  The patient is here for annual preventive examination and management of other chronic and acute problems.   The risk factors are reflected in the social history.   The roster of all physicians providing medical care to patient - is listed in the Snapshot section of the chart.   Activities of daily living:  The patient is 100% independent in all ADLs: dressing, toileting, feeding as well as independent mobility   Home safety : The patient has smoke detectors in the home. They wear seatbelts.  There are no unsecured firearms at home. There is no violence in the home.    There is no risks for hepatitis, STDs or HIV. There is no   history of blood transfusion. They have no travel history to infectious disease endemic areas of the world.   The patient has seen their dentist in the last six month. They have seen their eye doctor in the last year. The patinet  denies slight hearing difficulty with regard to whispered voices and some television programs.  They have deferred audiologic testing in the last year.  They do not  have excessive sun exposure. Discussed the need for sun protection: hats, long sleeves and use of sunscreen if there is significant sun exposure.    Diet: the importance of a healthy diet is discussed. They do have a healthy diet.   The benefits of regular aerobic exercise were discussed. The patient  exercises  3 to 5 days per week  for  60 minutes.    Depression screen: there are no signs or vegative symptoms of depression- irritability, change in appetite, anhedonia, sadness/tearfullness.   The following portions of the patient's history were reviewed and updated as appropriate: allergies, current medications, past family history, past medical history,  past surgical history, past social history  and problem list.   Visual acuity was not assessed per patient preference since the patient has  regular follow up with an  ophthalmologist. Hearing and body mass index were assessed and reviewed.    During the course of the visit the patient was educated and counseled about appropriate screening and preventive services including : fall prevention , diabetes screening, nutrition counseling, colorectal cancer screening, and recommended immunizations.    Chief Complaint: (several)  1) chronic sinus congestion    2) worried about a palpable lymph node  on left side of neck   3)  elevated BP  home readings have been  130/80 or less most of the time, so she  has not started triamterene   4) told  she had hashimoto's thyroiditis years ago by Dr.  Debera Lat ,  wants to have ab level checked.    5) wants to start progesterone for HRT because she is having trouble sleeping.  Wants to avoid estrogen because she is worried about CVA risk   HPI     Annual Exam    Additional comments: Physical with a pap smear      Last edited by Sandy Salaam, CMA on 12/22/2022  1:04 PM.        Review of Symptoms  Patient denies headache, fevers, malaise, unintentional weight loss, skin rash, eye pain, sinus congestion and sinus pain, sore throat, dysphagia,  hemoptysis , cough, dyspnea, wheezing, chest pain, palpitations, orthopnea, edema, abdominal pain, nausea, melena, diarrhea, constipation, flank pain, dysuria, hematuria, urinary  Frequency, nocturia, numbness, tingling, seizures,  Focal weakness, Loss  of consciousness,  Tremor, insomnia, depression, anxiety, and suicidal ideation.    Physical Exam:  BP 128/80   Pulse 68   Temp 97.9 F (36.6 C) (Oral)   Ht 5\' 6"  (1.676 m)   Wt 215 lb 9.6 oz (97.8 kg)   SpO2 98%   BMI 34.80 kg/m    Physical Exam Vitals reviewed.  Constitutional:      General: She is not in acute distress.    Appearance: Normal appearance. She is well-developed and normal weight. She is not ill-appearing, toxic-appearing or diaphoretic.  HENT:     Head: Normocephalic.      Right Ear: Tympanic membrane, ear canal and external ear normal. There is no impacted cerumen.     Left Ear: Tympanic membrane, ear canal and external ear normal. There is no impacted cerumen.     Nose: Nose normal.     Mouth/Throat:     Mouth: Mucous membranes are moist.     Pharynx: Oropharynx is clear.  Eyes:     General: No scleral icterus.       Right eye: No discharge.        Left eye: No discharge.     Conjunctiva/sclera: Conjunctivae normal.     Pupils: Pupils are equal, round, and reactive to light.  Neck:     Thyroid: No thyromegaly.     Vascular: No carotid bruit or JVD.  Cardiovascular:     Rate and Rhythm: Normal rate and regular rhythm.     Heart sounds: Normal heart sounds.  Pulmonary:     Effort: Pulmonary effort is normal. No respiratory distress.     Breath sounds: Normal breath sounds.  Chest:  Breasts:    Breasts are symmetrical.     Right: Normal. No swelling, inverted nipple, mass, nipple discharge, skin change or tenderness.     Left: Normal. No swelling, inverted nipple, mass, nipple discharge, skin change or tenderness.  Abdominal:     General: Bowel sounds are normal.     Palpations: Abdomen is soft. There is no mass.     Tenderness: There is no abdominal tenderness. There is no guarding or rebound.     Hernia: There is no hernia in the left inguinal area or right inguinal area.  Genitourinary:    Exam position: Lithotomy position.     Pubic Area: No rash or pubic lice.      Labia:        Right: No rash, tenderness, lesion or injury.        Left: No rash, tenderness, lesion or injury.      Vagina: Normal.     Cervix: Normal.     Uterus: Normal.      Adnexa: Right adnexa normal and left adnexa normal.  Musculoskeletal:        General: Normal range of motion.     Cervical back: Normal range of motion and neck supple.  Lymphadenopathy:     Cervical: No cervical adenopathy.     Upper Body:     Right upper body: No supraclavicular, axillary or  pectoral adenopathy.     Left upper body: No supraclavicular, axillary or pectoral adenopathy.     Lower Body: No right inguinal adenopathy. No left inguinal adenopathy.  Skin:    General: Skin is warm and dry.  Neurological:     General: No focal deficit present.     Mental Status: She is alert and oriented to person, place, and time. Mental status is at  baseline.  Psychiatric:        Mood and Affect: Mood normal.        Behavior: Behavior normal.        Thought Content: Thought content normal.        Judgment: Judgment normal.    Assessment and Plan: Cervical cancer screening -     Cytology - PAP  Essential hypertension Assessment & Plan:  She is using metoprolol prn palpitations and has not started maxzide because home readings have been normal  Lab Results  Component Value Date   MICROALBUR <0.7 05/05/2022   MICROALBUR <0.7 05/29/2021       Orders: -     Microalbumin / creatinine urine ratio  Hypothyroidism due to Hashimoto's thyroiditis Assessment & Plan: Thyroid function is at goal on 125 mcg daily;   she has requested antibody testing for Hashimoto's thyoiditis    Lab Results  Component Value Date   TSH 2.73 11/10/2022     Orders: -     Thyroglobulin antibody -     Thyroid peroxidase antibody -     Thyroglobulin Level  Colon cancer screening -     Ambulatory referral to Gastroenterology  Encounter for Papanicolaou smear for cervical cancer screening Assessment & Plan: PAP smear done    Chronic congestion of paranasal sinus Assessment & Plan: Symptoms have persisted despite use of antihistamines.  Referring to ENT for allergy testing and evaluation    Menopause Assessment & Plan: Flsuhing has reduced,  but still having difficulty with insomnia.  Trial of progesterone 100 mg daily    Other orders -     Levothyroxine Sodium; TAKE 1 TABLET BY MOUTH ONCE DAILY. TAKE ON EMPTY STOMACH WITH A GLASS OF WATER AT LEAST 30-60 MINUTES BEFORE BREAKFAST   Dispense: 90 tablet; Refill: 1 -     traZODone HCl; Take 0.5-1 tablets (25-50 mg total) by mouth at bedtime as needed for sleep.  Dispense: 30 tablet; Refill: 3 -     Progesterone; Take 1 capsule (100 mg total) by mouth at bedtime.  Dispense: 30 capsule; Refill: 2  A total of 40 minutes was spent with patient more than half of which was spent in counseling patient on the above mentioned issues , reviewing and explaining recent labs and imaging studies done, and coordination of care.   Return in about 6 months (around 06/24/2023) for hypertension.  Sherlene Shams, MD

## 2022-12-22 NOTE — Patient Instructions (Addendum)
Trial of low dose progesterone at bedtime for menopause related symptoms of insomnia.    If not tolerated,  You might want to try using Relaxium for insomnia  (as seen on TV commercials) . It is available through [employer] and contains all natural supplements:  Melatonin 5 mg  Chamomile 25 mg Passionflower extract 75 mg GABA 100 mg Ashwaganda extract 125 mg (this is a low dose,  1/4 of the commonly used dose )  Magnesium citrate, glycinate, oxide (100 mg)  L tryptophan 500 mg Valerest (proprietary  ingredient ; probably valeria root extract)     Referral to [facility] and [facility] in progress

## 2022-12-23 DIAGNOSIS — Z780 Asymptomatic menopausal state: Secondary | ICD-10-CM | POA: Insufficient documentation

## 2022-12-23 DIAGNOSIS — Z124 Encounter for screening for malignant neoplasm of cervix: Secondary | ICD-10-CM | POA: Insufficient documentation

## 2022-12-23 DIAGNOSIS — J329 Chronic sinusitis, unspecified: Secondary | ICD-10-CM | POA: Insufficient documentation

## 2022-12-23 LAB — MICROALBUMIN / CREATININE URINE RATIO
Creatinine,U: 33.1 mg/dL
Microalb Creat Ratio: 2.1 mg/g (ref 0.0–30.0)
Microalb, Ur: <0.7 mg/dL (ref 0.0–1.9)

## 2022-12-23 NOTE — Assessment & Plan Note (Signed)
Flsuhing has reduced,  but still having difficulty with insomnia.  Trial of progesterone 100 mg daily

## 2022-12-23 NOTE — Assessment & Plan Note (Signed)
Symptoms have persisted despite use of antihistamines.  Referring to ENT for allergy testing and evaluation

## 2022-12-23 NOTE — Assessment & Plan Note (Signed)
PAP smear done.  

## 2022-12-23 NOTE — Assessment & Plan Note (Signed)
Thyroid function is at goal on 125 mcg daily;   she has requested antibody testing for Hashimoto's thyoiditis    Lab Results  Component Value Date   TSH 2.73 11/10/2022

## 2022-12-23 NOTE — Assessment & Plan Note (Signed)
She is using metoprolol prn palpitations and has not started maxzide because home readings have been normal  Lab Results  Component Value Date   MICROALBUR <0.7 05/05/2022   MICROALBUR <0.7 05/29/2021

## 2022-12-24 LAB — CYTOLOGY - PAP
Comment: NEGATIVE
Diagnosis: NEGATIVE
High risk HPV: NEGATIVE

## 2022-12-24 LAB — THYROGLOBULIN ANTIBODY: Thyroglobulin Ab: <1 IU/mL (ref <=1)

## 2022-12-24 LAB — THYROID PEROXIDASE ANTIBODY: Thyroperoxidase Ab SerPl-aCnc: 80 IU/mL — ABNORMAL HIGH (ref <9)

## 2022-12-24 LAB — THYROGLOBULIN LEVEL: Thyroglobulin: 173.1 ng/mL — ABNORMAL HIGH

## 2023-03-18 ENCOUNTER — Telehealth: Payer: Self-pay

## 2023-03-18 NOTE — Telephone Encounter (Signed)
Noted message has been sent to front in pt's sons chart

## 2023-03-18 NOTE — Telephone Encounter (Signed)
Patient states both she and her husband are patients of Dr. Duncan Dull.  Patient states she would like to know if Dr. [name] would be willing to see their son, Gerilynn Mccullars, as a new patient.

## 2023-04-14 NOTE — Progress Notes (Unsigned)
Cardiology Office Note Date:  04/15/2023  Patient ID:  Brittany Brooks, Brittany Brooks 1965/07/01, MRN [id number] PCP:  Sherlene Shams, MD  Cardiologist:  None Electrophysiologist: Sherryl Manges, MD    Chief Complaint: 6 mon follow-up, past due  History of Present Illness: Brittany Brooks is a 57 y.o. female with PMH notable for SVT, HTN, palpitations, hypothyroid; seen today for Sherryl Manges, MD for routine electrophysiology followup.  She last saw Dr. Graciela Husbands 10/2020, c/o heart fluttering every couple weeks. She previously had c/o nocturnal tachy-palpitations. She also c/o highly variable BP - low and lightheaded in mornings but also > at times. Usually 120-140s. Dr. Graciela Husbands recommended a kardiaMobile to try to capture the rhythms.   On follow-up today, she continues to have rare fluttering episodes, happen about once every 6-7 months always in the night. Longest episode has been about 10 minutes in duration. She has some chest discomfort during episode. Episodes usually terminate with coughing or belching. Separately, she has PVC palpitation episodes, she feels her PVCs daily but they are typically one or two beats. She has noticed that during periods of high stress, she has more PVCs. During most recent PVC palpitation episodes, she forgot to take her lopressor. In the past when she took lopressor, she felt very fatigued.   She is working with PCP on BP control. BP in the mornings is low 100-110s / 60-70s    AAD History: None   Past Medical History:  Diagnosis Date   Diverticulitis    Diverticulitis large intestine 03/12/2017   Dysrhythmia    Hyperlipidemia    Hypertension    Hypothyroidism    since early 20's, secondary to Hashimoto's   Multinodular goiter (nontoxic)    biopsy negativre by [facility]   Pancreatitis 2000   secondary to cholangiogram during lap chole, [facility]   Sleep apnea     Past Surgical History:  Procedure Laterality Date   CARDIAC CATHETERIZATION N/A 08/08/2015    Procedure: Left Heart Cath and Coronary Angiography;  Surgeon: Dalia Heading, MD;  Location: [facility] INVASIVE CV LAB;  Service: Cardiovascular;  Laterality: N/A;   CESAREAN SECTION  Sept 2000   CHOLECYSTECTOMY  2000   recurrent cholecystitis during pregnancy    Current Outpatient Medications  Medication Instructions   ALPRAZolam (XANAX) 0.25 mg, Oral, At bedtime PRN   atorvastatin (LIPITOR) 20 mg, Oral, Daily   Calcium Citrate 200 MG TABS Take 1 tablet by mouth once daily   cholecalciferol (VITAMIN D3) 1,000 Units, Oral, Daily   LACTOBACILLUS PO 1 capsule, Oral, Daily   levothyroxine (SYNTHROID) 125 MCG tablet TAKE 1 TABLET BY MOUTH ONCE DAILY. TAKE ON EMPTY STOMACH WITH A GLASS OF WATER AT LEAST 30-60 MINUTES BEFORE BREAKFAST   metoprolol tartrate (LOPRESSOR) 25 mg, Oral, 2 times daily PRN, For rapid heart rate (palpitations)   Misc Natural Products (T-RELIEF CBD+13) SUBL One tablet as needed for anxiety   Multiple Vitamin (MULTIVITAMIN) tablet 1 tablet, Daily   progesterone (PROMETRIUM) 100 mg, Oral, Nightly   traZODone (DESYREL) 25-50 mg, Oral, At bedtime PRN   zinc gluconate 50 mg, Oral, Daily    Social History:  The patient  reports that she has never smoked. She has never used smokeless tobacco. She reports current alcohol use of about 4.0 standard drinks of alcohol per week. She reports that she does not use drugs.   Family History:  The patient's family history includes COPD in her father and mother; Diabetes in her father; Heart disease (  age of onset: 52) in her father; Hyperlipidemia in her father and mother; Hypertension in her mother; Stroke in her father.  ROS:  Please see the history of present illness. All other systems are reviewed and otherwise negative.   PHYSICAL EXAM:  VS:  BP (!) 148/88   Pulse 67   Ht 5\' 6"  (1.676 m)   Wt 215 lb (97.5 kg)   SpO2 99%   BMI 34.70 kg/m  BMI: Body mass index is 34.7 kg/m.  Vitals:   04/15/23 1035 04/15/23 1100  BP: (!) 150/80  (!) 148/88  Pulse: 67   Height: 5\' 6"  (1.676 m)   Weight: 215 lb (97.5 kg)   SpO2: 99%   BMI (Calculated): 34.72      GEN- The patient is well appearing, alert and oriented x 3 today.   Lungs- rhonchi in RUL, does not clear with coughing. Otherwise, CTA, normal work of breathing.  Heart- Regular rate and rhythm, no murmurs, rubs or gallops Extremities- Trace peripheral edema, warm, dry   EKG is ordered. Personal review of EKG from today shows:    EKG Interpretation Date/Time:  Wednesday April 15 2023 10:42:55 EST Ventricular Rate:  67 PR Interval:  124 QRS Duration:  78 QT Interval:  412 QTC Calculation: 435 R Axis:   14  Text Interpretation: Normal sinus rhythm Normal ECG Confirmed by Sherie Don [phone]) on 04/15/2023 10:45:39 AM    Recent Labs: 05/05/2022: Hemoglobin 12.4; Platelets 281.0 11/10/2022: ALT 15; BUN 17; Creatinine, Ser 0.67; Potassium 4.1; Sodium 139; TSH 2.73  11/10/2022: Cholesterol 254; Direct LDL 176.0; HDL 43.70; Total CHOL/HDL Ratio 6; Triglycerides 347.0; VLDL 69.4   CrCl cannot be calculated (Patient's most recent lab result is older than the maximum 21 days allowed.).   Wt Readings from Last 3 Encounters:  04/15/23 215 lb (97.5 kg)  12/22/22 215 lb 9.6 oz (97.8 kg)  11/10/22 218 lb 6.4 oz (99.1 kg)     Additional studies reviewed include: Previous EP, cardiology notes.   Long Term monitor (3d), 02/12/2018 SVT >> atrial tach with at least two morphology PVC 6% monomorphic  NO sympltoms reported  LHC, 08/08/2015 The left ventricular systolic function is normal. normal coronary arteries  NM myocardial perfusion, 08/03/2015 (via [facility] Everywhere)  Normal LV function with borderline anterior ischemia.  Given  persistent symptoms would recommend proceeding with left cardiac  catheterization to evaluate coronary anatomy   TTE, 07/09/2015 (via [facility] Everywhere) NORMAL LEFT VENTRICULAR SYSTOLIC FUNCTION   WITH MILD LVH  EF >55%  Closest EF: >55%  (Estimated)  LVH: MILD LVH  Aortic: TRIVIAL AR  Mitral: TRIVIAL MR  Tricuspid: TRIVIAL TR    ASSESSMENT AND PLAN:  #) SVT #) Palpitations #) PVC Continues to have multiple times of palpitation episodes. Discussed updating monitor to further eval, patient is not interested at this time. Given that episodes are typically very short in duration without associated symptoms, I think this is reasonable at this time. We discussed that if symptomatic burden increases, please notify office Will decrease PRN lopressor dose to 25mg  PRN, encouraged to take during prolonged episodes   #) HTN Slightly above goal today.  Recommend checking blood pressures 1-2 times per week at home and recording the values.  Recommend bringing these recordings to the primary care physician.         Current medicines are reviewed at length with the patient today.   The patient has concerns regarding her medicines.  The following changes were made today:  REDUCE lopressor to 25mg  BID PRN for palpitations  Labs/ tests ordered today include:  Orders Placed This Encounter  Procedures   EKG 12-Lead     Disposition: Follow up with Dr. Graciela Husbands or EP APP in 6 months   Signed, Sherie Don, NP  04/15/23  12:41 PM  Electrophysiology [facility]

## 2023-04-15 ENCOUNTER — Ambulatory Visit: Payer: No Typology Code available for payment source | Attending: Cardiology | Admitting: Cardiology

## 2023-04-15 ENCOUNTER — Encounter: Payer: Self-pay | Admitting: Cardiology

## 2023-04-15 VITALS — BP 148/88 | HR 67 | Ht 66.0 in | Wt 215.0 lb

## 2023-04-15 DIAGNOSIS — I471 Supraventricular tachycardia, unspecified: Secondary | ICD-10-CM | POA: Diagnosis not present

## 2023-04-15 MED ORDER — METOPROLOL TARTRATE 25 MG PO TABS: 25.0000 mg | ORAL_TABLET | Freq: Two times a day (BID) | ORAL | 3 refills | Status: AC | PRN

## 2023-04-15 NOTE — Patient Instructions (Signed)
Medication Instructions:  Your physician recommends the following medication changes.  DECREASE: Lopressor 25 mg twice daily as needed for palpitations  *If you need a refill on your cardiac medications before your next appointment, please call your pharmacy*   Lab Work: None ordered   Follow-Up: At [facility], you and your health needs are our priority.  As part of our continuing mission to provide you with exceptional heart care, we have created designated Provider Care Teams.  These Care Teams include your primary Cardiologist (physician) and Advanced Practice Providers (APPs -  Physician Assistants and Nurse Practitioners) who all work together to provide you with the care you need, when you need it.  We recommend signing up for the patient portal called "MyChart".  Sign up information is provided on this After Visit Summary.  MyChart is used to connect with patients for Virtual Visits (Telemedicine).  Patients are able to view lab/test results, encounter notes, upcoming appointments, etc.  Non-urgent messages can be sent to your provider as well.   To learn more about what you can do with MyChart, go to [email or web address].    Your next appointment:   6 month(s)  Provider:   Sherryl Manges, MD or Sherie Don, NP

## 2023-05-19 ENCOUNTER — Other Ambulatory Visit: Payer: Self-pay | Admitting: Internal Medicine

## 2023-05-28 ENCOUNTER — Ambulatory Visit: Payer: No Typology Code available for payment source | Admitting: Family

## 2023-05-28 ENCOUNTER — Encounter: Payer: Self-pay | Admitting: Family

## 2023-05-28 VITALS — BP 144/82 | HR 78 | Temp 97.8°F | Ht 66.0 in | Wt 220.0 lb

## 2023-05-28 DIAGNOSIS — J40 Bronchitis, not specified as acute or chronic: Secondary | ICD-10-CM | POA: Insufficient documentation

## 2023-05-28 DIAGNOSIS — J029 Acute pharyngitis, unspecified: Secondary | ICD-10-CM

## 2023-05-28 DIAGNOSIS — R059 Cough, unspecified: Secondary | ICD-10-CM

## 2023-05-28 LAB — POC COVID19 BINAXNOW: SARS Coronavirus 2 Ag: NEGATIVE

## 2023-05-28 LAB — POCT RAPID STREP A (OFFICE): Rapid Strep A Screen: NEGATIVE

## 2023-05-28 LAB — POCT INFLUENZA A/B
Influenza A, POC: NEGATIVE
Influenza B, POC: NEGATIVE

## 2023-05-28 MED ORDER — AZITHROMYCIN 250 MG PO TABS: ORAL_TABLET | ORAL | 0 refills | Status: DC

## 2023-05-28 MED ORDER — PREDNISONE 10 MG PO TABS: ORAL_TABLET | ORAL | 0 refills | Status: DC

## 2023-05-28 NOTE — Progress Notes (Signed)
Assessment & Plan:  Bronchitis Assessment & Plan: Afebrile.  Chills have resolved.  No acute respiratory distress.  Reassuring exam.  Advised based on duration more likely to be viral in etiology.  Patient has been on prednisone in the past and politely request having this medication on hand.  Provided her with prednisone and counseled on side effects including elevation of blood pressure, heart rate.  Advised conservative management with Mucinex. I did send in azithromycin if symptoms were to persist or get worse.  She will let us know how she is doing.   Orders: -     predniSONE; Take 40 mg by mouth on day 1, then taper 10 mg daily until gone  Dispense: 10 tablet; Refill: 0 -     Azithromycin; Take 2 tablets ( total 500 mg) PO on day 1, then take 1 tablet ( total 250 mg) by mouth q24h x 4 days.  Dispense: 6 tablet; Refill: 0  Cough, unspecified type -     POC COVID-19 BinaxNow -     POCT Influenza A/B  Sore throat -     POCT rapid strep A     Return precautions given.   Risks, benefits, and alternatives of the medications and treatment plan prescribed today were discussed, and patient expressed understanding.   Education regarding symptom management and diagnosis given to patient on AVS either electronically or printed.  No follow-ups on file.  Rennie Plowman, FNP  Subjective:    Patient ID: Brittany Brooks, female    DOB: 1966-03-20, 57 y.o.   MRN: [id number]  CC: Brittany Brooks is a 57 y.o. female who presents today for an acute visit.    HPI: Complains of dry cough ,congestion for 6 days, waxing and waning. Endorses hoarseness, scratchy throat.  Cough is episodic, more when talking.   Endorses fatigue  She had chills 5 days ago, since resolved.   No fever, sob, wheezing   No sick exposures   She has taken mucinex 400mg  every 4 hours and unsure if helping.      She has taken prednisone in the past without palpitations.  She would like to have this on  hand.    History of hypertension, paroxysmal SVT, OSA  Non smoker  No h/o CKD    Allergies: Lexapro [escitalopram oxalate] Current Outpatient Medications on File Prior to Visit  Medication Sig Dispense Refill   ALPRAZolam (XANAX) 0.25 MG tablet TAKE 1 TABLET BY MOUTH AT BEDTIME AS NEEDED FOR ANXIETY 30 tablet 5   atorvastatin (LIPITOR) 20 MG tablet Take 1 tablet (20 mg total) by mouth daily. 90 tablet 3   Calcium Citrate 200 MG TABS Take 1 tablet by mouth once daily     cholecalciferol (VITAMIN D3) 25 MCG (1000 UNIT) tablet Take 1,000 Units by mouth daily.     LACTOBACILLUS PO Take 1 capsule by mouth daily.     levothyroxine (SYNTHROID) 125 MCG tablet TAKE 1 TABLET BY MOUTH ONCE DAILY. TAKE ON EMPTY STOMACH WITH A GLASS OF WATER AT LEAST 30-60 MINUTES BEFORE BREAKFAST 90 tablet 1   metoprolol tartrate (LOPRESSOR) 25 MG tablet Take 1 tablet (25 mg total) by mouth 2 (two) times daily as needed. For rapid heart rate (palpitations) 180 tablet 3   Misc Natural Products (T-RELIEF CBD+13) SUBL One tablet as needed for anxiety 90 tablet 0   Multiple Vitamin (MULTIVITAMIN) tablet Take 1 tablet by mouth daily.     progesterone (PROMETRIUM) 100 MG capsule TAKE 1  CAPSULE BY MOUTH AT BEDTIME. 30 capsule 2   zinc gluconate 50 MG tablet Take 50 mg by mouth daily.     No current facility-administered medications on file prior to visit.    Review of Systems  Constitutional:  Positive for fatigue. Negative for chills (resolved) and fever.  HENT:  Positive for congestion and voice change.   Respiratory:  Positive for cough.   Cardiovascular:  Negative for chest pain and palpitations.  Gastrointestinal:  Negative for nausea and vomiting.      Objective:    BP (!) 144/82 (BP Location: Left Arm, Patient Position: Sitting, Cuff Size: Normal)   Pulse 78   Temp 97.8 F (36.6 C) (Oral)   Ht 5\' 6"  (1.676 m)   Wt 220 lb (99.8 kg)   SpO2 97%   BMI 35.51 kg/m   BP Readings from Last 3 Encounters:   05/28/23 (!) 144/82  04/15/23 (!) 148/88  12/22/22 128/80   Wt Readings from Last 3 Encounters:  05/28/23 220 lb (99.8 kg)  04/15/23 215 lb (97.5 kg)  12/22/22 215 lb 9.6 oz (97.8 kg)    Physical Exam Vitals reviewed.  Constitutional:      Appearance: She is well-developed.  HENT:     Head: Normocephalic and atraumatic.     Right Ear: Hearing, tympanic membrane, ear canal and external ear normal. No decreased hearing noted. No drainage, swelling or tenderness. No middle ear effusion. No foreign body. Tympanic membrane is not erythematous or bulging.     Left Ear: Hearing, tympanic membrane, ear canal and external ear normal. No decreased hearing noted. No drainage, swelling or tenderness.  No middle ear effusion. No foreign body. Tympanic membrane is not erythematous or bulging.     Nose: Nose normal. No rhinorrhea.     Right Sinus: No maxillary sinus tenderness or frontal sinus tenderness.     Left Sinus: No maxillary sinus tenderness or frontal sinus tenderness.     Mouth/Throat:     Pharynx: Uvula midline. No oropharyngeal exudate or posterior oropharyngeal erythema.     Tonsils: No tonsillar abscesses.  Eyes:     Conjunctiva/sclera: Conjunctivae normal.  Cardiovascular:     Rate and Rhythm: Regular rhythm.     Pulses: Normal pulses.     Heart sounds: Normal heart sounds.  Pulmonary:     Effort: Pulmonary effort is normal.     Breath sounds: Normal breath sounds. No wheezing, rhonchi or rales.  Lymphadenopathy:     Head:     Right side of head: No submental, submandibular, tonsillar, preauricular, posterior auricular or occipital adenopathy.     Left side of head: No submental, submandibular, tonsillar, preauricular, posterior auricular or occipital adenopathy.     Cervical: No cervical adenopathy.  Skin:    General: Skin is warm and dry.  Neurological:     Mental Status: She is alert.  Psychiatric:        Speech: Speech normal.        Behavior: Behavior normal.         Thought Content: Thought content normal.

## 2023-05-28 NOTE — Patient Instructions (Signed)
I suspect her cough is viral in nature.  Continue Mucinex. If symptoms persist, or get worse, you may start azithromycin.  If needed, you may use prednisone as requested.  Please monitor for increased heart rate, increased blood pressure

## 2023-05-28 NOTE — Assessment & Plan Note (Signed)
Afebrile.  Chills have resolved.  No acute respiratory distress.  Reassuring exam.  Advised based on duration more likely to be viral in etiology.  Patient has been on prednisone in the past and politely request having this medication on hand.  Provided her with prednisone and counseled on side effects including elevation of blood pressure, heart rate.  Advised conservative management with Mucinex. I did send in azithromycin if symptoms were to persist or get worse.  She will let us know how she is doing.

## 2023-06-19 ENCOUNTER — Ambulatory Visit: Payer: No Typology Code available for payment source | Admitting: Family

## 2023-06-19 ENCOUNTER — Encounter: Payer: Self-pay | Admitting: Family

## 2023-06-19 VITALS — BP 130/70 | HR 87 | Temp 98.3°F | Ht 65.0 in | Wt 221.0 lb

## 2023-06-19 DIAGNOSIS — R1030 Lower abdominal pain, unspecified: Secondary | ICD-10-CM | POA: Insufficient documentation

## 2023-06-19 DIAGNOSIS — K5732 Diverticulitis of large intestine without perforation or abscess without bleeding: Secondary | ICD-10-CM | POA: Insufficient documentation

## 2023-06-19 LAB — POCT URINALYSIS DIPSTICK
Bilirubin, UA: NEGATIVE
Blood, UA: NEGATIVE
Glucose, UA: NEGATIVE
Ketones, UA: NEGATIVE
Leukocytes, UA: NEGATIVE
Protein, UA: NEGATIVE
Spec Grav, UA: 1.015 (ref 1.010–1.025)
Urobilinogen, UA: 0.2 U/dL
pH, UA: 7 (ref 5.0–8.0)

## 2023-06-19 LAB — COMPREHENSIVE METABOLIC PANEL
ALT: 15 U/L (ref 0–35)
AST: 15 U/L (ref 0–37)
Albumin: 4.3 g/dL (ref 3.5–5.2)
Alkaline Phosphatase: 54 U/L (ref 39–117)
BUN: 16 mg/dL (ref 6–23)
CO2: 31 meq/L (ref 19–32)
Calcium: 9.3 mg/dL (ref 8.4–10.5)
Chloride: 101 meq/L (ref 96–112)
Creatinine, Ser: 0.64 mg/dL (ref 0.40–1.20)
GFR: 99.7 mL/min (ref >60.00)
Glucose, Bld: 90 mg/dL (ref 70–99)
Potassium: 4.3 meq/L (ref 3.5–5.1)
Sodium: 138 meq/L (ref 135–145)
Total Bilirubin: 0.4 mg/dL (ref 0.2–1.2)
Total Protein: 7.6 g/dL (ref 6.0–8.3)

## 2023-06-19 LAB — URINALYSIS, ROUTINE W REFLEX MICROSCOPIC
Bilirubin Urine: NEGATIVE
Hgb urine dipstick: NEGATIVE
Ketones, ur: NEGATIVE
Leukocytes,Ua: NEGATIVE
Nitrite: NEGATIVE
RBC / HPF: NONE SEEN (ref 0–?)
Specific Gravity, Urine: <=1.005 — AB (ref 1.000–1.030)
Total Protein, Urine: NEGATIVE
Urine Glucose: NEGATIVE
Urobilinogen, UA: 0.2 (ref 0.0–1.0)
WBC, UA: NONE SEEN (ref 0–?)
pH: 6 (ref 5.0–8.0)

## 2023-06-19 LAB — CBC WITH DIFFERENTIAL/PLATELET
Basophils Absolute: 0.1 10*3/uL (ref 0.0–0.1)
Basophils Relative: 0.8 % (ref 0.0–3.0)
Eosinophils Absolute: 0.2 10*3/uL (ref 0.0–0.7)
Eosinophils Relative: 2.1 % (ref 0.0–5.0)
HCT: 39.3 % (ref 36.0–46.0)
Hemoglobin: 12.7 g/dL (ref 12.0–15.0)
Lymphocytes Relative: 18.3 % (ref 12.0–46.0)
Lymphs Abs: 1.7 10*3/uL (ref 0.7–4.0)
MCHC: 32.2 g/dL (ref 30.0–36.0)
MCV: 85.5 fL (ref 78.0–100.0)
Monocytes Absolute: 0.9 10*3/uL (ref 0.1–1.0)
Monocytes Relative: 9.1 % (ref 3.0–12.0)
Neutro Abs: 6.6 10*3/uL (ref 1.4–7.7)
Neutrophils Relative %: 69.7 % (ref 43.0–77.0)
Platelets: 300 10*3/uL (ref 150.0–400.0)
RBC: 4.6 Mil/uL (ref 3.87–5.11)
RDW: 14.2 % (ref 11.5–15.5)
WBC: 9.5 10*3/uL (ref 4.0–10.5)

## 2023-06-19 LAB — AMYLASE: Amylase: 31 U/L (ref 27–131)

## 2023-06-19 NOTE — Progress Notes (Signed)
 Assessment & Plan:  Lower abdominal pain Assessment & Plan: Afebrile and nontoxic in appearance.  Differential includes gastroenteritis after azithromycin , IBS, UTI.  Advised CT a/p to evaluate for diverticulitis as patient had a history therefore and also lower abdominal pain ( resolved today) due to risk of perforation, abscess if diagnosis not made promptly.   she understands reason for imaging and politely declines at this time.  She feels comfortable monitoring for recurrence of symptoms.  Absence of leukocytosis, normal urine culture are reassuring in lab work.   Orders: -     Amylase -     Comprehensive metabolic panel -     CBC with Differential/Platelet -     Urinalysis, Routine w reflex microscopic -     Urine Culture -     POCT urinalysis dipstick     Return precautions given.   Risks, benefits, and alternatives of the medications and treatment plan prescribed today were discussed, and patient expressed understanding.   Education regarding symptom management and diagnosis given to patient on AVS either electronically or printed.  No follow-ups on file.  Rollene Northern, FNP  Subjective:    Patient ID: Brittany Brooks, female    DOB: Aug 27, 1965, 58 y.o.   MRN: [id number]  CC: Brittany Brooks is a 58 y.o. female who presents today for an acute visit.    HPI: Complains of intermittent diffuse lower abdominal pain  x one week, better today Feels 'uncomfortable' and 'pressure'.   She thinks zpak last month may have contributed Denies chills, fever, vomiting  She had had episodic sharp pain last week, since resolved.   She has taken Gas x OTC with some relief. She had felt some constipation followed by watery brown diarrhea , which resolved and she felt more 'relief. This is not unusual for her in the setting of IBS.    h/o IBS  She has used a heating pad, aleve .         Zpak 05/28/23   H/o SVT, OBS, hypothyroidism, diverticulitis (treated with  ciprofloxacin , flagyl ) , pancreatitis a/w cholecystectomy  History of cholecystectomy  Occasional alcohol, non-smoker  She has never had a colonoscopy; due  TSH 2.73 11/10/22    Allergies: Lexapro  [escitalopram  oxalate] Current Outpatient Medications on File Prior to Visit  Medication Sig Dispense Refill   ALPRAZolam  (XANAX ) 0.25 MG tablet TAKE 1 TABLET BY MOUTH AT BEDTIME AS NEEDED FOR ANXIETY 30 tablet 5   atorvastatin  (LIPITOR) 20 MG tablet Take 1 tablet (20 mg total) by mouth daily. 90 tablet 3   Calcium  Citrate 200 MG TABS Take 1 tablet by mouth once daily     cholecalciferol (VITAMIN D3) 25 MCG (1000 UNIT) tablet Take 1,000 Units by mouth daily.     LACTOBACILLUS PO Take 1 capsule by mouth daily.     levothyroxine  (SYNTHROID ) 125 MCG tablet TAKE 1 TABLET BY MOUTH ONCE DAILY. TAKE ON EMPTY STOMACH WITH A GLASS OF WATER AT LEAST 30-60 MINUTES BEFORE BREAKFAST 90 tablet 1   metoprolol  tartrate (LOPRESSOR ) 25 MG tablet Take 1 tablet (25 mg total) by mouth 2 (two) times daily as needed. For rapid heart rate (palpitations) 180 tablet 3   Misc Natural Products (T-RELIEF CBD+13) SUBL One tablet as needed for anxiety 90 tablet 0   Multiple Vitamin (MULTIVITAMIN) tablet Take 1 tablet by mouth daily.     predniSONE  (DELTASONE ) 10 MG tablet Take 40 mg by mouth on day 1, then taper 10 mg daily until gone 10  tablet 0   progesterone  (PROMETRIUM ) 100 MG capsule TAKE 1 CAPSULE BY MOUTH AT BEDTIME. 30 capsule 2   zinc gluconate 50 MG tablet Take 50 mg by mouth daily.     No current facility-administered medications on file prior to visit.    Review of Systems  Constitutional:  Negative for chills and fever.  Respiratory:  Negative for cough.   Cardiovascular:  Negative for chest pain and palpitations.  Gastrointestinal:  Positive for abdominal pain, constipation and diarrhea. Negative for blood in stool, nausea and vomiting.  Genitourinary:  Negative for difficulty urinating and dysuria.       Objective:    BP 130/70   Pulse 87   Temp 98.3 F (36.8 C) (Oral)   Ht 5' 5 (1.651 m)   Wt 221 lb (100.2 kg)   LMP  (LMP Unknown)   SpO2 98%   BMI 36.78 kg/m   BP Readings from Last 3 Encounters:  06/19/23 130/70  05/28/23 (!) 144/82  04/15/23 (!) 148/88   Wt Readings from Last 3 Encounters:  06/19/23 221 lb (100.2 kg)  05/28/23 220 lb (99.8 kg)  04/15/23 215 lb (97.5 kg)    Physical Exam Vitals reviewed.  Constitutional:      Appearance: Normal appearance. She is well-developed.  Eyes:     Conjunctiva/sclera: Conjunctivae normal.  Cardiovascular:     Rate and Rhythm: Normal rate and regular rhythm.     Pulses: Normal pulses.     Heart sounds: Normal heart sounds.  Pulmonary:     Effort: Pulmonary effort is normal.     Breath sounds: Normal breath sounds. No wheezing, rhonchi or rales.  Abdominal:     General: Bowel sounds are normal. There is no distension.     Palpations: Abdomen is soft. Abdomen is not rigid. There is no fluid wave or mass.     Tenderness: There is no abdominal tenderness. There is no right CVA tenderness, left CVA tenderness, guarding or rebound.     Comments: Abdomen is soft  Skin:    General: Skin is warm and dry.  Neurological:     Mental Status: She is alert.  Psychiatric:        Speech: Speech normal.        Behavior: Behavior normal.        Thought Content: Thought content normal.

## 2023-06-19 NOTE — Assessment & Plan Note (Addendum)
 Afebrile and nontoxic in appearance.  Differential includes gastroenteritis after azithromycin , IBS, UTI.  Advised CT a/p to evaluate for diverticulitis as patient had a history therefore and also lower abdominal pain ( resolved today) due to risk of perforation, abscess if diagnosis not made promptly.   she understands reason for imaging and politely declines at this time.  She feels comfortable monitoring for recurrence of symptoms.  Absence of leukocytosis, normal urine culture are reassuring in lab work.

## 2023-06-20 LAB — URINE CULTURE
MICRO NUMBER:: 15943341
Result:: NO GROWTH
SPECIMEN QUALITY:: ADEQUATE

## 2023-08-26 ENCOUNTER — Encounter: Payer: Self-pay | Admitting: Internal Medicine

## 2023-08-26 DIAGNOSIS — E063 Autoimmune thyroiditis: Secondary | ICD-10-CM

## 2023-08-26 DIAGNOSIS — E559 Vitamin D deficiency, unspecified: Secondary | ICD-10-CM

## 2023-08-26 DIAGNOSIS — E042 Nontoxic multinodular goiter: Secondary | ICD-10-CM

## 2023-08-26 DIAGNOSIS — R7303 Prediabetes: Secondary | ICD-10-CM

## 2023-08-26 DIAGNOSIS — I10 Essential (primary) hypertension: Secondary | ICD-10-CM

## 2023-08-26 NOTE — Telephone Encounter (Signed)
 Pt advise to schedule appt to discuss

## 2023-08-28 ENCOUNTER — Telehealth: Payer: Self-pay

## 2023-08-28 ENCOUNTER — Other Ambulatory Visit: Payer: Self-pay | Admitting: Internal Medicine

## 2023-08-28 MED ORDER — ESTRADIOL 0.1 MG/GM VA CREA: TOPICAL_CREAM | VAGINAL | 12 refills | Status: AC

## 2023-08-28 NOTE — Telephone Encounter (Signed)
 Reason for CRM: Patient is requesting an order and to schedule for TSH, Vitamin D, and Thyroid panel.   Pt was last seen in July 2024 by Dr [name]

## 2023-08-28 NOTE — Telephone Encounter (Signed)
 error

## 2023-09-02 ENCOUNTER — Ambulatory Visit: Admitting: Internal Medicine

## 2023-10-07 ENCOUNTER — Ambulatory Visit: Payer: No Typology Code available for payment source | Admitting: Cardiology

## 2023-10-20 ENCOUNTER — Other Ambulatory Visit: Payer: Self-pay | Admitting: Internal Medicine

## 2023-10-29 ENCOUNTER — Ambulatory Visit: Attending: Cardiology | Admitting: Cardiology

## 2023-10-29 VITALS — BP 142/84 | HR 80 | Ht 65.0 in | Wt 218.6 lb

## 2023-10-29 DIAGNOSIS — I471 Supraventricular tachycardia, unspecified: Secondary | ICD-10-CM | POA: Diagnosis not present

## 2023-10-29 DIAGNOSIS — E782 Mixed hyperlipidemia: Secondary | ICD-10-CM | POA: Diagnosis not present

## 2023-10-29 DIAGNOSIS — I700 Atherosclerosis of aorta: Secondary | ICD-10-CM

## 2023-10-29 DIAGNOSIS — I493 Ventricular premature depolarization: Secondary | ICD-10-CM | POA: Diagnosis not present

## 2023-10-29 NOTE — Patient Instructions (Signed)
 Medication Instructions:  The current medical regimen is effective;  continue present plan and medications as directed. Please refer to the Current Medication list given to you today.   *If you need a refill on your cardiac medications before your next appointment, please call your pharmacy*  Testing/Procedures: Calcium  SCORE   Follow-Up: At [facility], you and your health needs are our priority.  As part of our continuing mission to provide you with exceptional heart care, our providers are all part of one team.  This team includes your primary Cardiologist (physician) and Advanced Practice Providers or APPs (Physician Assistants and Nurse Practitioners) who all work together to provide you with the care you need, when you need it.  Your next appointment:   12 month(s)  Provider:   Suzann Riddle, NP    We recommend signing up for the patient portal called "MyChart".  Sign up information is provided on this After Visit Summary.  MyChart is used to connect with patients for Virtual Visits (Telemedicine).  Patients are able to view lab/test results, encounter notes, upcoming appointments, etc.  Non-urgent messages can be sent to your provider as well.   To learn more about what you can do with MyChart, go to [email or web address].

## 2023-10-29 NOTE — Progress Notes (Signed)
 Electrophysiology Clinic Note    Date:  10/30/2023  Patient ID:  Brittany Brooks Dec 06, 1965, MRN [id number] PCP:  Thersia Flax, MD  Cardiologist:  None Electrophysiologist: Brittany Chandler, MD   Discussed the use of AI scribe software for clinical note transcription with the patient, who gave verbal consent to proceed.   Patient Profile    Chief Complaint: SVT, PVC follow-up  History of Present Illness: Brittany Brooks is a 58 y.o. female with PMH notable for SVT, PVCs, palpitations, HTN, hypothyroid, OSA ; seen today for Brittany Chandler, MD for routine electrophysiology followup.   I last saw her 04/2023 where she had rare SVT/fluttering episodes every few months that last about 10 minutes. Separately, she had awareness of PVCs several times a day that were not prolonged or bothersome. Deferred monitor to further eval.    On follow-up today, she is feeling very well. She has not had any recent SVT episodes that she is aware of. She does continue to have PVCs, but they do not effect her life style, more of an awareness than a problem.  She has been working-out more often than normal, and is recently divorced which has significantly reduced her stress. She is not taking her BB regularly or PRN.  She denies chest pain, chest pressure, SOB.    Arrhythmia/Device History No specialty comments available.    ROS:  Please see the history of present illness. All other systems are reviewed and otherwise negative.    Physical Exam    VS:  BP (!) 142/84 (BP Location: Left Arm, Patient Position: Sitting, Cuff Size: Normal)   Pulse 80   Ht 5\' 5"  (1.651 m)   Wt 218 lb 9.6 oz (99.2 kg)   SpO2 97%   BMI 36.38 kg/m  BMI: Body mass index is 36.38 kg/m.  Wt Readings from Last 3 Encounters:  10/29/23 218 lb 9.6 oz (99.2 kg)  06/19/23 221 lb (100.2 kg)  05/28/23 220 lb (99.8 kg)     GEN- The patient is well appearing, alert and oriented x 3 today.   Lungs- Clear to ausculation  bilaterally, normal work of breathing.  Heart- Regular rate and rhythm, no murmurs, rubs or gallops Extremities- No peripheral edema, warm, dry   Studies Reviewed   Previous EP, cardiology notes.    EKG is ordered. Personal review of EKG from today shows:    EKG Interpretation Date/Time:  Thursday Oct 29 2023 14:53:05 EDT Ventricular Rate:  80 PR Interval:  122 QRS Duration:  78 QT Interval:  388 QTC Calculation: 447 R Axis:   31  Text Interpretation: Normal sinus rhythm Normal ECG Confirmed by Ellwyn Ergle [phone] on 10/29/2023 2:58:33 PM     Long Term monitor (3d), 02/12/2018 SVT >> atrial tach with at least two morphology PVC 6% monomorphic  NO sympltoms reported   LHC, 08/08/2015 The left ventricular systolic function is normal. normal coronary arteries   NM myocardial perfusion, 08/03/2015 (via [facility] Everywhere)  Normal LV function with borderline anterior ischemia.  Given  persistent symptoms would recommend proceeding with left cardiac  catheterization to evaluate coronary anatomy    TTE, 07/09/2015 (via [facility] Everywhere) NORMAL LEFT VENTRICULAR SYSTOLIC FUNCTION   WITH MILD LVH  EF >55%  Closest EF: >55% (Estimated)  LVH: MILD LVH  Aortic: TRIVIAL AR  Mitral: TRIVIAL MR  Tricuspid: TRIVIAL TR     Assessment and Plan     #) SVT #) PVC #) palpitations No recent  episodes of prolonged palpitations Patient exercising regularly without any concerning symptoms Continue 25mg  metoprolol  PRN  #) hyperlipidemia Lipids managed by PCP Patient requests cardiac scoring CT for CAD risk stratification, which is reasonable       Current medicines are reviewed at length with the patient today.   The patient does not have concerns regarding her medicines.  The following changes were made today:  none  Labs/ tests ordered today include:  Orders Placed This Encounter  Procedures   CT CARDIAC SCORING (SELF PAY ONLY)   EKG 12-Lead     Disposition: Follow up with  Dr. Rodolfo Clan or EP APP in 12 months. Discussed that it would also be reasonable to follow-up only with PCP. She has PCP appt soon and will discuss further with them.   Signed, Adaline Holly, NP  10/30/23  8:10 AM  Electrophysiology [facility]

## 2023-11-20 ENCOUNTER — Other Ambulatory Visit

## 2023-11-26 ENCOUNTER — Other Ambulatory Visit (INDEPENDENT_AMBULATORY_CARE_PROVIDER_SITE_OTHER)

## 2023-11-26 DIAGNOSIS — E559 Vitamin D deficiency, unspecified: Secondary | ICD-10-CM | POA: Diagnosis not present

## 2023-11-26 DIAGNOSIS — R7303 Prediabetes: Secondary | ICD-10-CM | POA: Diagnosis not present

## 2023-11-26 DIAGNOSIS — E063 Autoimmune thyroiditis: Secondary | ICD-10-CM

## 2023-11-26 DIAGNOSIS — I10 Essential (primary) hypertension: Secondary | ICD-10-CM | POA: Diagnosis not present

## 2023-11-26 LAB — COMPREHENSIVE METABOLIC PANEL WITH GFR
ALT: 15 U/L (ref 0–35)
AST: 18 U/L (ref 0–37)
Albumin: 4.4 g/dL (ref 3.5–5.2)
Alkaline Phosphatase: 42 U/L (ref 39–117)
BUN: 15 mg/dL (ref 6–23)
CO2: 32 meq/L (ref 19–32)
Calcium: 9.5 mg/dL (ref 8.4–10.5)
Chloride: 101 meq/L (ref 96–112)
Creatinine, Ser: 0.7 mg/dL (ref 0.40–1.20)
GFR: 97.27 mL/min (ref >60.00)
Glucose, Bld: 95 mg/dL (ref 70–99)
Potassium: 4.2 meq/L (ref 3.5–5.1)
Sodium: 139 meq/L (ref 135–145)
Total Bilirubin: 0.5 mg/dL (ref 0.2–1.2)
Total Protein: 7.6 g/dL (ref 6.0–8.3)

## 2023-11-26 LAB — VITAMIN D 25 HYDROXY (VIT D DEFICIENCY, FRACTURES): VITD: 43.78 ng/mL (ref 30.00–100.00)

## 2023-11-26 LAB — HEMOGLOBIN A1C: Hgb A1c MFr Bld: 6.1 % (ref 4.6–6.5)

## 2023-11-27 LAB — THYROID PANEL WITH TSH
Free Thyroxine Index: 3.2 (ref 1.4–3.8)
T3 Uptake: 31 % (ref 22–35)
T4, Total: 10.4 ug/dL (ref 5.1–11.9)
TSH: 2.32 m[IU]/L

## 2023-11-29 ENCOUNTER — Ambulatory Visit: Payer: Self-pay | Admitting: Internal Medicine

## 2023-11-29 DIAGNOSIS — E782 Mixed hyperlipidemia: Secondary | ICD-10-CM

## 2023-12-06 ENCOUNTER — Encounter: Payer: Self-pay | Admitting: Family

## 2023-12-07 ENCOUNTER — Telehealth: Payer: Self-pay

## 2023-12-07 ENCOUNTER — Encounter: Payer: Self-pay | Admitting: Emergency Medicine

## 2023-12-07 ENCOUNTER — Ambulatory Visit
Admission: EM | Admit: 2023-12-07 | Discharge: 2023-12-07 | Disposition: A | Discharge status: Home / Self Care | Attending: Physician Assistant | Admitting: Physician Assistant

## 2023-12-07 DIAGNOSIS — R197 Diarrhea, unspecified: Secondary | ICD-10-CM | POA: Insufficient documentation

## 2023-12-07 DIAGNOSIS — R1030 Lower abdominal pain, unspecified: Secondary | ICD-10-CM | POA: Diagnosis not present

## 2023-12-07 DIAGNOSIS — R1032 Left lower quadrant pain: Secondary | ICD-10-CM

## 2023-12-07 LAB — COMPREHENSIVE METABOLIC PANEL WITH GFR
ALT: 17 U/L (ref 0–44)
AST: 18 U/L (ref 15–41)
Albumin: 4 g/dL (ref 3.5–5.0)
Alkaline Phosphatase: 59 U/L (ref 38–126)
Anion gap: 11 (ref 5–15)
BUN: 16 mg/dL (ref 6–20)
CO2: 27 mmol/L (ref 22–32)
Calcium: 9.1 mg/dL (ref 8.9–10.3)
Chloride: 100 mmol/L (ref 98–111)
Creatinine, Ser: 0.7 mg/dL (ref 0.44–1.00)
GFR, Estimated: >60 mL/min (ref >60)
Glucose, Bld: 100 mg/dL — ABNORMAL HIGH (ref 70–99)
Potassium: 4.3 mmol/L (ref 3.5–5.1)
Sodium: 138 mmol/L (ref 135–145)
Total Bilirubin: 0.4 mg/dL (ref 0.0–1.2)
Total Protein: 8.2 g/dL — ABNORMAL HIGH (ref 6.5–8.1)

## 2023-12-07 LAB — CBC WITH DIFFERENTIAL/PLATELET
Abs Immature Granulocytes: 0.04 10*3/uL (ref 0.00–0.07)
Basophils Absolute: 0 10*3/uL (ref 0.0–0.1)
Basophils Relative: 0 %
Eosinophils Absolute: 0.2 10*3/uL (ref 0.0–0.5)
Eosinophils Relative: 2 %
HCT: 37 % (ref 36.0–46.0)
Hemoglobin: 12.1 g/dL (ref 12.0–15.0)
Immature Granulocytes: 0 %
Lymphocytes Relative: 16 %
Lymphs Abs: 1.7 10*3/uL (ref 0.7–4.0)
MCH: 27 pg (ref 26.0–34.0)
MCHC: 32.7 g/dL (ref 30.0–36.0)
MCV: 82.6 fL (ref 80.0–100.0)
Monocytes Absolute: 0.8 10*3/uL (ref 0.1–1.0)
Monocytes Relative: 8 %
Neutro Abs: 7.7 10*3/uL (ref 1.7–7.7)
Neutrophils Relative %: 74 %
Platelets: 325 10*3/uL (ref 150–400)
RBC: 4.48 MIL/uL (ref 3.87–5.11)
RDW: 13.7 % (ref 11.5–15.5)
WBC: 10.5 10*3/uL (ref 4.0–10.5)
nRBC: 0 % (ref 0.0–0.2)

## 2023-12-07 LAB — URINALYSIS, W/ REFLEX TO CULTURE (INFECTION SUSPECTED)
Bacteria, UA: NONE SEEN
Bilirubin Urine: NEGATIVE
Glucose, UA: NEGATIVE mg/dL
Hgb urine dipstick: NEGATIVE
Ketones, ur: NEGATIVE mg/dL
Leukocytes,Ua: NEGATIVE
Nitrite: NEGATIVE
Protein, ur: NEGATIVE mg/dL
RBC / HPF: NONE SEEN RBC/hpf (ref 0–5)
Specific Gravity, Urine: 1.025 (ref 1.005–1.030)
WBC, UA: NONE SEEN WBC/hpf (ref 0–5)
pH: 6 (ref 5.0–8.0)

## 2023-12-07 MED ORDER — DICYCLOMINE HCL 20 MG PO TABS: 20.0000 mg | ORAL_TABLET | Freq: Four times a day (QID) | ORAL | 0 refills | Status: AC | PRN

## 2023-12-07 NOTE — ED Triage Notes (Signed)
 Pt presents with suprapubic lower abdominal pain x 5 days. She describes the pain as constant burning, sharp, and sore. Her last BM was today with not much stool.

## 2023-12-07 NOTE — Discharge Instructions (Signed)
-   Your urinalysis and labs today were normal. - Please follow-up with your primary care provider if you continue to have this discomfort as you may need to CT scan. - If you have acute worsening of your pain or fever please go to the ER for more emergent CT scan.  As discussed, I cannot 100% rule out diverticulitis because I do not have access to CT scan in the urgent care this evening.  If you feel that your condition is worsening at all or is not starting to improve in the next couple of days or you are unable to follow-up with your PCP this week consider going to emergency department.

## 2023-12-07 NOTE — ED Provider Notes (Signed)
 [facility]    CSN: [id number] Arrival date & time: 12/07/23  1453      History   Chief Complaint Chief Complaint  Patient presents with   Abdominal Pain    HPI Brittany Brooks is a 58 y.o. female presenting for 5-day history of central lower abdominal pain/pressure.  Patient reports occasional sharp stabbing pains in this region.  Reports episodes last about 5 minutes and she has about 10 of them a day.  Reports increased fatigue this time is still gone.  Has had some diarrhea but also has a history of IBS.  Patient denies any fevers.  Has had loss of appetite.  No cough, congestion, sore throat, chest pain, shortness of breath, flank pain, hematuria, vaginal discharge, dysuria, frequency or urgency.  Reports having frequency and couple of days ago.  Has been taking prebiotic's and probiotics without relief.  Similar symptoms back in January which self resolved.  Patient does have history of diverticulitis in 2018.  She is unsure if her current symptoms are consistent with that or not.  States she reached out to her PCPs office today with her symptoms but did not receive a call back.  HPI  Past Medical History:  Diagnosis Date   Diverticulitis    Diverticulitis large intestine 03/12/2017   Dysrhythmia    Hyperlipidemia    Hypertension    Hypothyroidism    since early 20's, secondary to Hashimoto's   Multinodular goiter (nontoxic)    biopsy negativre by [facility]   Pancreatitis 2000   secondary to cholangiogram during lap chole, [facility]   Sleep apnea     Patient Active Problem List   Diagnosis Date Noted   PVC (premature ventricular contraction) 10/29/2023   Lower abdominal pain 06/19/2023   Bronchitis 05/28/2023   Encounter for Papanicolaou smear for cervical cancer screening 12/23/2022   Chronic congestion of paranasal sinus 12/23/2022   Menopause 12/23/2022   Paroxysmal SVT (supraventricular tachycardia) (HCC) 11/11/2022   Nocturia 10/07/2021   Aortic  atherosclerosis (HCC) 12/15/2020   Encounter for preventive health examination 12/15/2020   Menopausal flushing 05/19/2018   Chondromalacia patellae 10/19/2017   Osteoarthritis of knee 10/19/2017   OSA (obstructive sleep apnea) 10/14/2015   Mixed hyperlipidemia 06/29/2015   Personal history of other diseases of the digestive system 06/29/2015   Pre-diabetes 05/18/2015   Vitamin D  deficiency 05/18/2015   Essential hypertension 09/24/2014   Cervical spine ankylosis 07/22/2012   Ankylosing spondylitis of cervical region (HCC) 07/22/2012   Obesity (BMI 30-39.9) 09/28/2011   Hypothyroidism due to Hashimoto's thyroiditis 09/28/2011   Major depressive disorder, recurrent episode (HCC) 09/28/2011   Multinodular goiter (nontoxic)    History of pancreatitis     Past Surgical History:  Procedure Laterality Date   CARDIAC CATHETERIZATION N/A 08/08/2015   Procedure: Left Heart Cath and Coronary Angiography;  Surgeon: Vinie DELENA Jude, MD;  Location: [facility] INVASIVE CV LAB;  Service: Cardiovascular;  Laterality: N/A;   CESAREAN SECTION  Sept 2000   CHOLECYSTECTOMY  2000   recurrent cholecystitis during pregnancy    OB History     Gravida  1   Para  1   Term  1   Preterm      AB      Living  1      SAB      IAB      Ectopic      Multiple      Live Births  1  Home Medications    Prior to Admission medications   Medication Sig Start Date End Date Taking? Authorizing Provider  dicyclomine (BENTYL) 20 MG tablet Take 1 tablet (20 mg total) by mouth every 6 (six) hours as needed for spasms. 12/07/23  Yes Arvis Huxley B, PA-C  ALPRAZolam  (XANAX ) 0.25 MG tablet TAKE 1 TABLET BY MOUTH AT BEDTIME AS NEEDED FOR ANXIETY 05/20/23   Marylynn Verneita CROME, MD  atorvastatin  (LIPITOR) 20 MG tablet Take 1 tablet (20 mg total) by mouth daily. Patient not taking: Reported on 10/29/2023 11/10/22   Marylynn Verneita CROME, MD  Calcium  Citrate 200 MG TABS Take 1 tablet by mouth once daily     [provider]  cholecalciferol (VITAMIN D3) 25 MCG (1000 UNIT) tablet Take 1,000 Units by mouth daily.    [provider]  estradiol  (ESTRACE  VAGINAL) 0.1 MG/GM vaginal cream Insert 1 gram intravaginally at bedtime daily for 2 weeks,  Then reduce use to twice weekly thereafter 08/28/23   Tullo, Teresa L, MD  LACTOBACILLUS PO Take 1 capsule by mouth daily.    [provider]  levothyroxine  (SYNTHROID ) 125 MCG tablet TAKE 1 TABLET BY MOUTH ONCE DAILY. TAKE ON EMPTY STOMACH WITH A GLASS OF WATER AT LEAST 30-60 MINUTES BEFORE BREAKFAST 12/22/22   Marylynn Verneita CROME, MD  metoprolol  tartrate (LOPRESSOR ) 25 MG tablet Take 1 tablet (25 mg total) by mouth 2 (two) times daily as needed. For rapid heart rate (palpitations) 04/15/23   Riddle, Suzann, NP  Misc Natural Products (T-RELIEF CBD+13) SUBL One tablet as needed for anxiety 10/07/21   Tullo, Teresa L, MD  Multiple Vitamin (MULTIVITAMIN) tablet Take 1 tablet by mouth daily.    [provider]  predniSONE  (DELTASONE ) 10 MG tablet Take 40 mg by mouth on day 1, then taper 10 mg daily until gone Patient not taking: Reported on 10/29/2023 05/28/23   Dineen Rollene MATSU, FNP  progesterone  (PROMETRIUM ) 100 MG capsule TAKE 1 CAPSULE BY MOUTH AT BEDTIME. 10/20/23   Marylynn Verneita CROME, MD  zinc gluconate 50 MG tablet Take 50 mg by mouth daily.    [provider]    Family History Family History  Problem Relation Age of Onset   Hyperlipidemia Mother    Hypertension Mother    COPD Mother    Hyperlipidemia Father    Heart disease Father 62       CABG x 4   Diabetes Father    Stroke Father        TIA   COPD Father    Cancer Neg Hx    Breast cancer Neg Hx     Social History Social History   Tobacco Use   Smoking status: Never   Smokeless tobacco: Never  Vaping Use   Vaping status: Never Used  Substance Use Topics   Alcohol use: Yes    Alcohol/week: 4.0 standard drinks of alcohol    Types: 4 Glasses of wine per  week    Comment: occassional   Drug use: No     Allergies   Lexapro  [escitalopram  oxalate]   Review of Systems Review of Systems  Constitutional:  Positive for appetite change and fatigue. Negative for chills, diaphoresis and fever.  HENT:  Negative for congestion.   Respiratory:  Negative for cough and shortness of breath.   Cardiovascular:  Negative for chest pain.  Gastrointestinal:  Positive for abdominal pain and diarrhea. Negative for constipation, nausea and vomiting.  Genitourinary:  Negative for difficulty urinating, dysuria and  hematuria.  Musculoskeletal:  Negative for myalgias.  Skin:  Negative for rash.  Neurological:  Negative for weakness and headaches.     Physical Exam Triage Vital Signs ED Triage Vitals  Encounter Vitals Group     BP 12/07/23 1508 (!) 159/93     Girls Systolic BP Percentile --      Girls Diastolic BP Percentile --      Boys Systolic BP Percentile --      Boys Diastolic BP Percentile --      Pulse Rate 12/07/23 1508 82     Resp 12/07/23 1508 16     Temp 12/07/23 1508 98.5 F (36.9 C)     Temp Source 12/07/23 1508 Oral     SpO2 12/07/23 1508 97 %     Weight --      Height --      Head Circumference --      Peak Flow --      Pain Score 12/07/23 1507 5     Pain Loc --      Pain Education --      Exclude from Growth Chart --    No data found.  Updated Vital Signs BP (!) 159/93 (BP Location: Right Arm)   Pulse 82   Temp 98.5 F (36.9 C) (Oral)   Resp 16   SpO2 97%    Physical Exam Vitals and nursing note reviewed.  Constitutional:      General: She is not in acute distress.    Appearance: Normal appearance. She is well-developed. She is not ill-appearing or toxic-appearing.  HENT:     Head: Normocephalic and atraumatic.     Nose: Nose normal.     Mouth/Throat:     Mouth: Mucous membranes are moist.     Pharynx: Oropharynx is clear.   Eyes:     General: No scleral icterus.       Right eye: No discharge.        Left  eye: No discharge.     Conjunctiva/sclera: Conjunctivae normal.    Cardiovascular:     Rate and Rhythm: Normal rate and regular rhythm.     Heart sounds: Normal heart sounds.  Pulmonary:     Effort: Pulmonary effort is normal. No respiratory distress.     Breath sounds: Normal breath sounds.  Abdominal:     Tenderness: There is abdominal tenderness in the suprapubic area. There is no right CVA tenderness or left CVA tenderness.   Musculoskeletal:     Cervical back: Neck supple.   Skin:    General: Skin is dry.   Neurological:     General: No focal deficit present.     Mental Status: She is alert. Mental status is at baseline.     Motor: No weakness.     Gait: Gait normal.   Psychiatric:        Mood and Affect: Mood normal.        Behavior: Behavior normal.      UC Treatments / Results  Labs (all labs ordered are listed, but only abnormal results are displayed) Labs Reviewed  COMPREHENSIVE METABOLIC PANEL WITH GFR - Abnormal; Notable for the following components:      Result Value   Glucose, Bld 100 (*)    Total Protein 8.2 (*)    All other components within normal limits  URINALYSIS, W/ REFLEX TO CULTURE (INFECTION SUSPECTED)  CBC WITH DIFFERENTIAL/PLATELET    EKG   Radiology No results found.  Procedures  Procedures (including critical care time)  Medications Ordered in UC Medications - No data to display  Initial Impression / Assessment and Plan / UC Course  I have reviewed the triage vital signs and the nursing notes.  Pertinent labs & imaging results that were available during my care of the patient were reviewed by me and considered in my medical decision making (see chart for details).   58 year old female with history of obesity, hypothyroidism due to Hashimoto's thyroiditis, hypertension, depression, sleep apnea, prediabetes, aortic atherosclerosis, PSVT, PVCs, and diverticulitis presents for 5-day history of central lower abdominal pain/suprapubic  pressure, fatigue, loss of appetite, and diarrhea.  No associated fevers.  Similar symptoms about 5 to 6 months ago which self resolved.  Blood pressure elevated 159/93.  Other vitals normal and stable.  Overall well-appearing.  On exam chest clear.  Heart regular rate and rhythm.  Abdomen soft with tenderness in the suprapubic region.  No guarding or rebound.  No CVA tenderness.  Urinalysis is negative.  CBC and CMP ordered. Labs unremarkable.   Reviewed all results with patient.  Relatively low concern for diverticulitis given that she is afebrile and labs are essentially normal.  However, since the pain has been intermittent, severe and she has a history of diverticulitis I have advised her to reach back out to her PCP office for appointment and hopefully they can order CT outpatient for her.  Advise going to emergency department if fever or worsening of abdominal pain or change in overall status such as becoming more fatigued or weak.  Sent dicyclomine to pharmacy to help with the cramping if it is related to IBS.  Patient is agreeable to plan.   Final Clinical Impressions(s) / UC Diagnoses   Final diagnoses:  Lower abdominal pain  Diarrhea, unspecified type     Discharge Instructions      - Your urinalysis and labs today were normal. - Please follow-up with your primary care provider if you continue to have this discomfort as you may need to CT scan. - If you have acute worsening of your pain or fever please go to the ER for more emergent CT scan.  As discussed, I cannot 100% rule out diverticulitis because I do not have access to CT scan in the urgent care this evening.  If you feel that your condition is worsening at all or is not starting to improve in the next couple of days or you are unable to follow-up with your PCP this week consider going to emergency department.     ED Prescriptions     Medication Sig Dispense Auth. Provider   dicyclomine (BENTYL) 20 MG tablet Take 1  tablet (20 mg total) by mouth every 6 (six) hours as needed for spasms. 20 tablet Yavonne Kiss B, PA-C      PDMP not reviewed this encounter.   Arvis Jolan NOVAK, PA-C 12/07/23 1655

## 2023-12-07 NOTE — Telephone Encounter (Signed)
 See recommendation from the [facility] doctor

## 2023-12-07 NOTE — Telephone Encounter (Signed)
 Copied from CRM [id number]. Topic: Clinical - Request for Lab/Test Order >> Dec 07, 2023  2:24 PM Chiquita SQUIBB wrote: Reason for CRM: [facility] is calling in regards to CT scan of the abdomen for the patient. Patient tried scheduling but they do not have an order, they are asking if it can be faxed over.  Fax number [phone]

## 2023-12-07 NOTE — Telephone Encounter (Signed)
 Spoke with pt and advised her of the message below. Pt stated that she would follow back up in the morning because she would like to have this scheduled sometime this week. Pt was advised that if she in pain or having worsening symptoms then she should go to the ED because they can do things a lot faster then we can. Pt stated that she is stable right now but would go if she needs to.

## 2023-12-07 NOTE — Addendum Note (Signed)
 Addended by: MARYLYNN VERNEITA CROME on: 12/07/2023 05:53 PM   Modules accepted: Orders

## 2023-12-07 NOTE — Telephone Encounter (Signed)
 Copied from CRM [id number]. Topic: Appointments - Appointment Scheduling >> Dec 07, 2023  8:51 AM Tonda B wrote: Patient/patient representative is calling to schedule an appointment. Refer to attachments for appointment information.  Patient is calling to see if she can get a CT scan scheduled please call patient back

## 2023-12-08 NOTE — Telephone Encounter (Unsigned)
 Copied from CRM [id number]. Topic: Clinical - Request for Lab/Test Order >> Dec 07, 2023  4:54 PM Drema MATSU wrote: Patient is urgently needing this order. She is calling for an update.

## 2023-12-08 NOTE — Telephone Encounter (Signed)
 See telephone encounter. Dr. Tullo has ordered the CT scan and pt is aware.

## 2023-12-09 ENCOUNTER — Ambulatory Visit: Admitting: Internal Medicine

## 2023-12-09 ENCOUNTER — Encounter: Payer: Self-pay | Admitting: Internal Medicine

## 2023-12-09 ENCOUNTER — Ambulatory Visit: Payer: Self-pay | Admitting: Internal Medicine

## 2023-12-09 ENCOUNTER — Ambulatory Visit
Admission: RE | Admit: 2023-12-09 | Discharge: 2023-12-09 | Disposition: A | Discharge status: Home / Self Care | Source: Ambulatory Visit | Attending: Internal Medicine | Admitting: Internal Medicine

## 2023-12-09 VITALS — BP 150/96 | HR 76 | Ht 65.0 in | Wt 215.6 lb

## 2023-12-09 DIAGNOSIS — R1030 Lower abdominal pain, unspecified: Secondary | ICD-10-CM

## 2023-12-09 DIAGNOSIS — E782 Mixed hyperlipidemia: Secondary | ICD-10-CM

## 2023-12-09 MED ORDER — AMOXICILLIN-POT CLAVULANATE 875-125 MG PO TABS: 1.0000 | ORAL_TABLET | Freq: Two times a day (BID) | ORAL | 0 refills | Status: DC

## 2023-12-09 MED ORDER — IOHEXOL 300 MG/ML  SOLN
100.0000 mL | Freq: Once | INTRAMUSCULAR | Status: AC | PRN
  Administered 2023-12-09: 100 mL via INTRAVENOUS

## 2023-12-09 NOTE — Addendum Note (Signed)
 Addended by: MARYLYNN VERNEITA CROME on: 12/09/2023 04:13 PM   Modules accepted: Orders

## 2023-12-09 NOTE — Progress Notes (Signed)
 Subjective:  Patient ID: Brittany Brooks, female    DOB: [birth year]  Age: 58 y.o. MRN: [id number]  CC: The primary encounter diagnosis was Mixed hyperlipidemia. A diagnosis of Lower abdominal pain of unknown etiology was also pertinent to this visit.   HPI Brittany Brooks presents for  Chief Complaint  Patient presents with   Abdominal Pain    58 yr old female presents with abdominal pain . She was seen on June 30 by urgent care with the following history :  5-day history of central lower abdominal pain/pressure followed by development of recurrent  sharp stabbing pains in this region which were severe . Reports episodes last about 5 minutes and she has about 10 of them a day. Reports increased fatigue this time is still gone. Has had some diarrhea but also has a history of IBS. Patient denies any fevers. Has had loss of appetite. No cough, congestion, sore throat, chest pain, shortness of breath, flank pain, hematuria, vaginal discharge, dysuria, frequency or urgency. Reports having frequency and couple of days ago. Has been taking prebiotic's and probiotics without relief. Similar symptoms back in January which self resolved. Patient was evaluated with UA/culture .  She was told she needed a CT but was told that the provider at urgent care could  not order it    The sharp stabbing pain has subsided as of yesterday but she continues to have lower abdominal crmaps.  Fatigue,  and change in  BM's which have been scant and loose    Outpatient Medications Prior to Visit  Medication Sig Dispense Refill   ALPRAZolam  (XANAX ) 0.25 MG tablet TAKE 1 TABLET BY MOUTH AT BEDTIME AS NEEDED FOR ANXIETY 30 tablet 5   cholecalciferol (VITAMIN D3) 25 MCG (1000 UNIT) tablet Take 1,000 Units by mouth daily.     dicyclomine (BENTYL) 20 MG tablet Take 1 tablet (20 mg total) by mouth every 6 (six) hours as needed for spasms. 20 tablet 0   estradiol  (ESTRACE  VAGINAL) 0.1 MG/GM vaginal cream Insert 1 gram  intravaginally at bedtime daily for 2 weeks,  Then reduce use to twice weekly thereafter 42.5 g 12   LACTOBACILLUS PO Take 1 capsule by mouth daily.     levothyroxine  (SYNTHROID ) 125 MCG tablet TAKE 1 TABLET BY MOUTH ONCE DAILY. TAKE ON EMPTY STOMACH WITH A GLASS OF WATER AT LEAST 30-60 MINUTES BEFORE BREAKFAST 90 tablet 1   metoprolol  tartrate (LOPRESSOR ) 25 MG tablet Take 1 tablet (25 mg total) by mouth 2 (two) times daily as needed. For rapid heart rate (palpitations) 180 tablet 3   Misc Natural Products (T-RELIEF CBD+13) SUBL One tablet as needed for anxiety 90 tablet 0   Multiple Vitamin (MULTIVITAMIN) tablet Take 1 tablet by mouth daily.     progesterone  (PROMETRIUM ) 100 MG capsule TAKE 1 CAPSULE BY MOUTH AT BEDTIME. 30 capsule 1   zinc gluconate 50 MG tablet Take 50 mg by mouth daily.     atorvastatin  (LIPITOR) 20 MG tablet Take 1 tablet (20 mg total) by mouth daily. (Patient not taking: Reported on 12/09/2023) 90 tablet 3   Calcium  Citrate 200 MG TABS Take 1 tablet by mouth once daily (Patient not taking: Reported on 12/09/2023)     predniSONE  (DELTASONE ) 10 MG tablet Take 40 mg by mouth on day 1, then taper 10 mg daily until gone (Patient not taking: Reported on 12/09/2023) 10 tablet 0   No facility-administered medications prior to visit.    Review of Systems;  Patient denies headache, fevers, malaise, unintentional weight loss, skin rash, eye pain, sinus congestion and sinus pain, sore throat, dysphagia,  hemoptysis , cough, dyspnea, wheezing, chest pain, palpitations, orthopnea, edema, abdominal pain, nausea, melena, diarrhea, constipation, flank pain, dysuria, hematuria, urinary  Frequency, nocturia, numbness, tingling, seizures,  Focal weakness, Loss of consciousness,  Tremor, insomnia, depression, anxiety, and suicidal ideation.      Objective:  BP (!) 150/96   Pulse 76   Ht 5' 5 (1.651 m)   Wt 215 lb 9.6 oz (97.8 kg)   SpO2 97%   BMI 35.88 kg/m   BP Readings from Last 3  Encounters:  12/09/23 (!) 150/96  12/07/23 (!) 159/93  10/29/23 (!) 142/84    Wt Readings from Last 3 Encounters:  12/09/23 215 lb 9.6 oz (97.8 kg)  10/29/23 218 lb 9.6 oz (99.2 kg)  06/19/23 221 lb (100.2 kg)    Physical Exam Vitals reviewed.  Constitutional:      General: She is not in acute distress.    Appearance: Normal appearance. She is normal weight. She is not ill-appearing, toxic-appearing or diaphoretic.  HENT:     Head: Normocephalic.  Eyes:     General: No scleral icterus.       Right eye: No discharge.        Left eye: No discharge.     Conjunctiva/sclera: Conjunctivae normal.  Cardiovascular:     Rate and Rhythm: Normal rate and regular rhythm.     Heart sounds: Normal heart sounds.  Pulmonary:     Effort: Pulmonary effort is normal. No respiratory distress.     Breath sounds: Normal breath sounds.  Abdominal:     General: Bowel sounds are decreased.     Palpations: Abdomen is soft.     Tenderness: There is abdominal tenderness in the suprapubic area. There is no guarding.  Musculoskeletal:        General: Normal range of motion.  Skin:    General: Skin is warm and dry.  Neurological:     General: No focal deficit present.     Mental Status: She is alert and oriented to person, place, and time. Mental status is at baseline.  Psychiatric:        Mood and Affect: Mood normal.        Behavior: Behavior normal.        Thought Content: Thought content normal.        Judgment: Judgment normal.     Lab Results  Component Value Date   HGBA1C 6.1 11/26/2023   HGBA1C 5.8 11/10/2022   HGBA1C 6.2 05/05/2022    Lab Results  Component Value Date   CREATININE 0.70 12/07/2023   CREATININE 0.70 11/26/2023   CREATININE 0.64 06/19/2023    Lab Results  Component Value Date   WBC 10.5 12/07/2023   HGB 12.1 12/07/2023   HCT 37.0 12/07/2023   PLT 325 12/07/2023   GLUCOSE 100 (H) 12/07/2023   CHOL 254 (H) 11/10/2022   TRIG 347.0 (H) 11/10/2022   HDL  43.70 11/10/2022   LDLDIRECT 176.0 11/10/2022   LDLCALC 170 (H) 08/18/2018   ALT 17 12/07/2023   AST 18 12/07/2023   NA 138 12/07/2023   K 4.3 12/07/2023   CL 100 12/07/2023   CREATININE 0.70 12/07/2023   BUN 16 12/07/2023   CO2 27 12/07/2023   TSH 2.32 11/26/2023   HGBA1C 6.1 11/26/2023    No results found.  Assessment & Plan:  .Mixed hyperlipidemia -  Lipid panel -     LDL cholesterol, direct  Lower abdominal pain of unknown etiology Assessment & Plan: She has a history of diverticulitis several years ago and had similar symptoms in January but did not have imaging. Recent UA was normal, ruling out cystitis and making neprholithiasis unlikely.    CT abd and pelvis needed to rule out diverticulitis   Orders: -     CT ABDOMEN PELVIS W CONTRAST; Future     I spent 34 minutes on the day of this face to face encounter reviewing patient's  most recent URGENT CARE VISIT, ,  prior relevant surgical and non surgical procedures, recent  labs and imaging studies, ,  reviewing the assessment and plan with patient, and post visit ordering and reviewing of  diagnostics and therapeutics with patient  .   Follow-up: No follow-ups on file.   Verneita LITTIE Kettering, MD

## 2023-12-09 NOTE — Patient Instructions (Signed)
 STAT CT ORDERED  AUGMENTIN SENT TO [facility] TO START IF DIVERTICULITIS IS CONFIRMED  MIRALAX AND CLEAR LIQUID DIET RECOMMENDED AS WELL

## 2023-12-09 NOTE — Assessment & Plan Note (Signed)
 She has a history of diverticulitis several years ago and had similar symptoms in January but did not have imaging. Recent UA was normal, ruling out cystitis and making neprholithiasis unlikely.    CT abd and pelvis needed to rule out diverticulitis

## 2023-12-10 ENCOUNTER — Other Ambulatory Visit: Payer: Self-pay | Admitting: Internal Medicine

## 2023-12-10 LAB — LIPID PANEL
Cholesterol: 307 mg/dL — ABNORMAL HIGH (ref 0–200)
HDL: 35.9 mg/dL — ABNORMAL LOW (ref >39.00)
LDL Cholesterol: 205 mg/dL — ABNORMAL HIGH (ref 0–99)
NonHDL: 270.72
Total CHOL/HDL Ratio: 9
Triglycerides: 329 mg/dL — ABNORMAL HIGH (ref 0.0–149.0)
VLDL: 65.8 mg/dL — ABNORMAL HIGH (ref 0.0–40.0)

## 2023-12-10 LAB — SEDIMENTATION RATE: Sed Rate: 16 mm/h (ref 0–30)

## 2023-12-10 LAB — C-REACTIVE PROTEIN: CRP: 3.1 mg/dL (ref 0.5–20.0)

## 2023-12-10 LAB — LDL CHOLESTEROL, DIRECT: Direct LDL: 213 mg/dL

## 2023-12-10 NOTE — Telephone Encounter (Signed)
 Patient had labs performed yesterday. Noted.

## 2023-12-10 NOTE — Telephone Encounter (Signed)
 Pt was seen by Dr. Marylynn yesterday and had the CT done.

## 2023-12-25 ENCOUNTER — Ambulatory Visit: Payer: Self-pay | Admitting: *Deleted

## 2023-12-25 ENCOUNTER — Other Ambulatory Visit: Payer: Self-pay | Admitting: Nurse Practitioner

## 2023-12-25 DIAGNOSIS — K5792 Diverticulitis of intestine, part unspecified, without perforation or abscess without bleeding: Secondary | ICD-10-CM

## 2023-12-25 MED ORDER — METRONIDAZOLE 500 MG PO TABS: 500.0000 mg | ORAL_TABLET | Freq: Two times a day (BID) | ORAL | 0 refills | Status: DC

## 2023-12-25 MED ORDER — CIPROFLOXACIN HCL 500 MG PO TABS: 500.0000 mg | ORAL_TABLET | Freq: Two times a day (BID) | ORAL | 0 refills | Status: DC

## 2023-12-25 NOTE — Telephone Encounter (Signed)
 Pt took abx for 7 days (augmentin ) for diverticulitis. Just finished a week ago and now pain has returned in the same area. It is mild pain but Dr Marylynn told her that she may need another round of abx if this occurs per pt. Advised that Dr Marylynn is out of office and patient needs to be re-evaluated, pt refused and her response was I just had a CT scan done on 7/2 and took a week of antibiotics, this is exactly the same, I do not need to go to urgent care. Dr Marylynn told me that I may need more antibiotics for it to clear. Her symptoms got better while on abx and have now returned. No nausea, vomiting, diarrhea, etc. Mild abd pain on the left side is her only complaint.

## 2023-12-25 NOTE — Telephone Encounter (Signed)
 Patient aware of below. She says she will find a GI doctor and f/u with Dr Marylynn about this.

## 2023-12-25 NOTE — Telephone Encounter (Signed)
 FYI Only or Action Required?: Action required by provider: medication refill request, clinical question for provider, and chronic issue, requesting if cipro  / flagyl  should be tried this time. Last augmentin  taken 12/09/23.  Patient was last seen in primary care on 12/09/2023 by Marylynn Verneita CROME, MD.  Called Nurse Triage reporting Abdominal Pain.  Symptoms began yesterday.  Interventions attempted: Rest, hydration, or home remedies.  Symptoms are: gradually worsening.  Triage Disposition: See Physician Within 24 Hours  Patient/caregiver understands and will follow disposition?: No, wishes to speak with PCP               Copied from CRM [id number]. Topic: Clinical - Red Word Triage >> Dec 25, 2023  2:11 PM Jayma L wrote: Red Word that prompted transfer to Nurse Triage: patient called in and stated she was seen for a CT scan for her stomach pain, she has been put on a antibiotic and finished it .SABRA Now she's having the pain on the left side and its worsened . Asking if she needs a new antibiotic to help with the diverticulosis Reason for Disposition  [1] MILD pain (e.g., does not interfere with normal activities) AND [2] pain comes and goes (cramps) AND [3] present > 48 hours  (Exception: This same abdominal pain is a chronic symptom recurrent or ongoing AND present > 4 weeks.)  Answer Assessment - Initial Assessment Questions Requesting medication , would like to know if she should take cipro  / flagyl  again since recently took augmentin  and now sx returned? Last prescribed on 12/09/23. Please advise if another OV required. Recommended if sx worsen, fever occurs go to ED. Patient requesting my chart message ok to receive.          1. LOCATION: Where does it hurt?      Left lower abdomen 2. RADIATION: Does the pain shoot anywhere else? (e.g., chest, back)     No  3. ONSET: When did the pain begin? (e.g., minutes, hours or days ago)      Yesterday  4. SUDDEN: Gradual or  sudden onset?     Na  5. PATTERN Does the pain come and go, or is it constant?     Comes and goes   6. SEVERITY: How bad is the pain?  (e.g., Scale 1-10; mild, moderate, or severe)     Getting worse at times. Quick stabbing pains at times 7. RECURRENT SYMPTOM: Have you ever had this type of stomach pain before? If Yes, ask: When was the last time? and What happened that time?      Yes hx diverticulitis 8. CAUSE: What do you think is causing the stomach pain? (e.g., gallstones, recent abdominal surgery)     Not sure  9. RELIEVING/AGGRAVATING FACTORS: What makes it better or worse? (e.g., antacids, bending or twisting motion, bowel movement)     Na  10. OTHER SYMPTOMS: Do you have any other symptoms? (e.g., back pain, diarrhea, fever, urination pain, vomiting)       Low left abdominal pain loose but not liquid. Feels run down and tired. 11. PREGNANCY: Is there any chance you are pregnant? When was your last menstrual period?       na  Protocols used: Abdominal Pain - Female-A-AH

## 2023-12-29 ENCOUNTER — Encounter: Payer: Self-pay | Admitting: Internal Medicine

## 2023-12-29 ENCOUNTER — Ambulatory Visit: Admitting: Internal Medicine

## 2023-12-29 VITALS — BP 130/88 | HR 75 | Ht 65.0 in | Wt 214.8 lb

## 2023-12-29 DIAGNOSIS — K5732 Diverticulitis of large intestine without perforation or abscess without bleeding: Secondary | ICD-10-CM | POA: Diagnosis not present

## 2023-12-29 DIAGNOSIS — Z1211 Encounter for screening for malignant neoplasm of colon: Secondary | ICD-10-CM | POA: Diagnosis not present

## 2023-12-29 MED ORDER — AMOXICILLIN-POT CLAVULANATE 875-125 MG PO TABS
1.0000 | ORAL_TABLET | Freq: Two times a day (BID) | ORAL | 0 refills | Status: DC
Start: 2023-12-29 — End: 2024-04-04

## 2023-12-29 NOTE — Patient Instructions (Signed)
 Resume daily miralax and a probiotic.  You can add benefiber if constipation does not improve   Start the augmentin  if your pain recurs.

## 2023-12-29 NOTE — Progress Notes (Unsigned)
 Subjective:  Patient ID: Brittany Brooks, female    DOB: January 13, 1966  Age: 58 y.o. MRN: [id number]  CC: There were no encounter diagnoses.   HPI Brittany Brooks presents for  Chief Complaint  Patient presents with   Medical Management of Chronic Issues    Follow up on diverticulitis   Diagnosed with diverticulitis  by abd ct on July 2.  Completed course of augmentin  and symptoms improved transiently including improvement in fatigue.  Rested during recover.  Llast Thursday developed LLQ pain and left hip pain  that was intermittent and stabbing pain which  improved with passing gas.  so she did  not take a second course of abx (cipro /flagyl ) prescribed by Gretel  during my absence. Stools since Thursday have been loose alternating with soft ,  but not normal. Ate yogurt during augmentin  use.  Has not resumed her supplements    Outpatient Medications Prior to Visit  Medication Sig Dispense Refill   ALPRAZolam  (XANAX ) 0.25 MG tablet TAKE 1 TABLET BY MOUTH AT BEDTIME AS NEEDED FOR ANXIETY 30 tablet 5   cholecalciferol (VITAMIN D3) 25 MCG (1000 UNIT) tablet Take 1,000 Units by mouth daily.     estradiol  (ESTRACE  VAGINAL) 0.1 MG/GM vaginal cream Insert 1 gram intravaginally at bedtime daily for 2 weeks,  Then reduce use to twice weekly thereafter 42.5 g 12   LACTOBACILLUS PO Take 1 capsule by mouth daily.     levothyroxine  (SYNTHROID ) 125 MCG tablet TAKE 1 TABLET EVERY DAY ON EMPTY STOMACHWITH A GLASS OF WATER AT LEAST 30-60 MINBEFORE BREAKFAST 90 tablet 1   metoprolol  tartrate (LOPRESSOR ) 25 MG tablet Take 1 tablet (25 mg total) by mouth 2 (two) times daily as needed. For rapid heart rate (palpitations) 180 tablet 3   Misc Natural Products (T-RELIEF CBD+13) SUBL One tablet as needed for anxiety 90 tablet 0   Multiple Vitamin (MULTIVITAMIN) tablet Take 1 tablet by mouth daily.     progesterone  (PROMETRIUM ) 100 MG capsule TAKE 1 CAPSULE BY MOUTH AT BEDTIME. 30 capsule 1   zinc gluconate 50 MG  tablet Take 50 mg by mouth daily.     ciprofloxacin  (CIPRO ) 500 MG tablet Take 1 tablet (500 mg total) by mouth 2 (two) times daily for 7 days. 14 tablet 0   dicyclomine  (BENTYL ) 20 MG tablet Take 1 tablet (20 mg total) by mouth every 6 (six) hours as needed for spasms. (Patient not taking: Reported on 12/29/2023) 20 tablet 0   metroNIDAZOLE  (FLAGYL ) 500 MG tablet Take 1 tablet (500 mg total) by mouth 2 (two) times daily for 7 days. 14 tablet 0   No facility-administered medications prior to visit.    Review of Systems;  Patient denies headache, fevers, malaise, unintentional weight loss, skin rash, eye pain, sinus congestion and sinus pain, sore throat, dysphagia,  hemoptysis , cough, dyspnea, wheezing, chest pain, palpitations, orthopnea, edema, abdominal pain, nausea, melena, diarrhea, constipation, flank pain, dysuria, hematuria, urinary  Frequency, nocturia, numbness, tingling, seizures,  Focal weakness, Loss of consciousness,  Tremor, insomnia, depression, anxiety, and suicidal ideation.      Objective:  BP 130/88   Pulse 75   Ht 5' 5 (1.651 m)   Wt 214 lb 12.8 oz (97.4 kg)   SpO2 99%   BMI 35.74 kg/m   BP Readings from Last 3 Encounters:  12/29/23 130/88  12/09/23 (!) 150/96  12/07/23 (!) 159/93    Wt Readings from Last 3 Encounters:  12/29/23 214 lb 12.8 oz (97.4  kg)  12/09/23 215 lb 9.6 oz (97.8 kg)  10/29/23 218 lb 9.6 oz (99.2 kg)    Physical Exam  Lab Results  Component Value Date   HGBA1C 6.1 11/26/2023   HGBA1C 5.8 11/10/2022   HGBA1C 6.2 05/05/2022    Lab Results  Component Value Date   CREATININE 0.70 12/07/2023   CREATININE 0.70 11/26/2023   CREATININE 0.64 06/19/2023    Lab Results  Component Value Date   WBC 10.5 12/07/2023   HGB 12.1 12/07/2023   HCT 37.0 12/07/2023   PLT 325 12/07/2023   GLUCOSE 100 (H) 12/07/2023   CHOL 307 (H) 12/09/2023   TRIG 329.0 (H) 12/09/2023   HDL 35.90 (L) 12/09/2023   LDLDIRECT 213.0 12/09/2023   LDLCALC  205 (H) 12/09/2023   ALT 17 12/07/2023   AST 18 12/07/2023   NA 138 12/07/2023   K 4.3 12/07/2023   CL 100 12/07/2023   CREATININE 0.70 12/07/2023   BUN 16 12/07/2023   CO2 27 12/07/2023   TSH 2.32 11/26/2023   HGBA1C 6.1 11/26/2023    CT ABDOMEN PELVIS W CONTRAST Result Date: 12/09/2023 CLINICAL DATA:  Abdominal pain and fatigue. EXAM: CT ABDOMEN AND PELVIS WITH CONTRAST TECHNIQUE: Multidetector CT imaging of the abdomen and pelvis was performed using the standard protocol following bolus administration of intravenous contrast. RADIATION DOSE REDUCTION: This exam was performed according to the departmental dose-optimization program which includes automated exposure control, adjustment of the mA and/or kV according to patient size and/or use of iterative reconstruction technique. CONTRAST:  OMNIPAQUE  IOHEXOL  300 MG/ML  SOLN COMPARISON:  March 13, 2017 FINDINGS: Lower chest: No acute abnormality. Hepatobiliary: No focal liver abnormality is seen. Status post cholecystectomy. No biliary dilatation. Pancreas: Unremarkable. No pancreatic ductal dilatation or surrounding inflammatory changes. Spleen: Normal in size without focal abnormality. Adrenals/Urinary Tract: Adrenal glands are unremarkable. Kidneys are normal in size, without obstructing renal calculi, focal lesion, or hydronephrosis. A 1 mm nonobstructing renal calculus is seen within the lower pole of the left kidney. Bladder is unremarkable. Stomach/Bowel: Stomach is within normal limits. The appendix is not identified. No evidence of bowel dilatation. Moderate to markedly inflamed diverticula are seen within the mid sigmoid colon. No associated perforation or abscess is seen. Vascular/Lymphatic: Aortic atherosclerosis. No enlarged abdominal or pelvic lymph nodes. Reproductive: Uterus and bilateral adnexa are unremarkable. Other: No abdominal wall hernia or abnormality. No abdominopelvic ascites. Musculoskeletal: No acute or significant  osseous findings. IMPRESSION: 1. Moderate to marked severity sigmoid diverticulitis. 2. 1 mm nonobstructing left renal calculus. 3. Evidence of prior cholecystectomy. 4. Aortic atherosclerosis. Electronically Signed   By: Suzen Dials M.D.   On: 12/09/2023 19:02    Assessment & Plan:  .There are no diagnoses linked to this encounter.   I spent 34 minutes on the day of this face to face encounter reviewing patient's  most recent visit with cardiology,  nephrology,  and neurology,  prior relevant surgical and non surgical procedures, recent  labs and imaging studies, counseling on weight management,  reviewing the assessment and plan with patient, and post visit ordering and reviewing of  diagnostics and therapeutics with patient  .   Follow-up: No follow-ups on file.   Verneita LITTIE Kettering, MD

## 2023-12-30 NOTE — Assessment & Plan Note (Signed)
 Confirmed by ABD CT. July 2 despite normal ESR/CRP  .  She completed 7 day course of augmentin  with improvement in symptoms.  Exam today is normal .  Recommend resuming benefiber/miralax for chronic constipation .  If LLQ pain recurs,  she has been given a refill on the augmentin  to start   Lab Results  Component Value Date   ESRSEDRATE 16 12/09/2023   Lab Results  Component Value Date   CRP 3.1 12/09/2023

## 2024-01-19 ENCOUNTER — Other Ambulatory Visit: Payer: Self-pay | Admitting: Internal Medicine

## 2024-04-04 ENCOUNTER — Telehealth: Admitting: Physician Assistant

## 2024-04-04 DIAGNOSIS — J069 Acute upper respiratory infection, unspecified: Secondary | ICD-10-CM

## 2024-04-04 MED ORDER — PROMETHAZINE-DM 6.25-15 MG/5ML PO SYRP: 5.0000 mL | ORAL_SOLUTION | Freq: Every evening | ORAL | 0 refills | Status: AC | PRN

## 2024-04-04 MED ORDER — PREDNISONE 20 MG PO TABS: 40.0000 mg | ORAL_TABLET | Freq: Every day | ORAL | 0 refills | Status: AC

## 2024-04-04 NOTE — Patient Instructions (Signed)
 Brittany Brooks, thank you for joining Delon [name], PA-C for today's virtual visit.  While this provider is not your primary care provider (PCP), if your PCP is located in our provider database this encounter information will be shared with them immediately following your visit.   A [facility] MyChart account gives you access to today's visit and all your visits, tests, and labs performed at [facility]  click here if you don't have a [facility] MyChart account or go to [email or web address]  Consent: (Patient) Brittany Brooks provided verbal consent for this virtual visit at the beginning of the encounter.  Current Medications:  Current Outpatient Medications:    ALPRAZolam  (XANAX ) 0.25 MG tablet, TAKE 1 TABLET BY MOUTH AT BEDTIME AS NEEDED FOR ANXIETY, Disp: 30 tablet, Rfl: 5   cholecalciferol (VITAMIN D3) 25 MCG (1000 UNIT) tablet, Take 1,000 Units by mouth daily., Disp: , Rfl:    dicyclomine  (BENTYL ) 20 MG tablet, Take 1 tablet (20 mg total) by mouth every 6 (six) hours as needed for spasms. (Patient not taking: Reported on 12/29/2023), Disp: 20 tablet, Rfl: 0   estradiol  (ESTRACE  VAGINAL) 0.1 MG/GM vaginal cream, Insert 1 gram intravaginally at bedtime daily for 2 weeks,  Then reduce use to twice weekly thereafter, Disp: 42.5 g, Rfl: 12   LACTOBACILLUS PO, Take 1 capsule by mouth daily., Disp: , Rfl:    levothyroxine  (SYNTHROID ) 125 MCG tablet, TAKE 1 TABLET EVERY DAY ON EMPTY STOMACHWITH A GLASS OF WATER AT LEAST 30-60 MINBEFORE BREAKFAST, Disp: 90 tablet, Rfl: 1   metoprolol  tartrate (LOPRESSOR ) 25 MG tablet, Take 1 tablet (25 mg total) by mouth 2 (two) times daily as needed. For rapid heart rate (palpitations), Disp: 180 tablet, Rfl: 3   Misc Natural Products (T-RELIEF CBD+13) SUBL, One tablet as needed for anxiety, Disp: 90 tablet, Rfl: 0   Multiple Vitamin (MULTIVITAMIN) tablet, Take 1 tablet by mouth daily., Disp: , Rfl:    predniSONE  (DELTASONE ) 20 MG tablet,  Take 2 tablets (40 mg total) by mouth daily with breakfast., Disp: 10 tablet, Rfl: 0   progesterone  (PROMETRIUM ) 100 MG capsule, TAKE 1 CAPSULE BY MOUTH AT BEDTIME, Disp: 30 capsule, Rfl: 1   promethazine-dextromethorphan (PROMETHAZINE-DM) 6.25-15 MG/5ML syrup, Take 5 mLs by mouth at bedtime as needed., Disp: 118 mL, Rfl: 0   zinc gluconate 50 MG tablet, Take 50 mg by mouth daily., Disp: , Rfl:    Medications ordered in this encounter:  Meds ordered this encounter  Medications   promethazine-dextromethorphan (PROMETHAZINE-DM) 6.25-15 MG/5ML syrup    Sig: Take 5 mLs by mouth at bedtime as needed.    Dispense:  118 mL    Refill:  0    Supervising Provider:   BLAISE ALEENE KIDD [[id number]]   predniSONE  (DELTASONE ) 20 MG tablet    Sig: Take 2 tablets (40 mg total) by mouth daily with breakfast.    Dispense:  10 tablet    Refill:  0    Supervising Provider:   BLAISE ALEENE KIDD [[id number]]     *If you need refills on other medications prior to your next appointment, please contact your pharmacy*  Follow-Up: Call back or seek an in-person evaluation if the symptoms worsen or if the condition fails to improve as anticipated.   Virtual Care [phone]  Other Instructions Upper Respiratory Infection, Adult An upper respiratory infection (URI) is a common viral infection of the nose, throat, and upper air passages that lead to the lungs. The most common  type of URI is the common cold. URIs usually get better on their own, without medical treatment. What are the causes? A URI is caused by a virus. You may catch a virus by: Breathing in droplets from an infected person's cough or sneeze. Touching something that has been exposed to the virus (is contaminated) and then touching your mouth, nose, or eyes. What increases the risk? You are more likely to get a URI if: You are very young or very old. You have close contact with others, such as at work, school, or a health care  facility. You smoke. You have long-term (chronic) heart or lung disease. You have a weakened disease-fighting system (immune system). You have nasal allergies or asthma. You are experiencing a lot of stress. You have poor nutrition. What are the signs or symptoms? A URI usually involves some of the following symptoms: Runny or stuffy (congested) nose. Cough. Sneezing. Sore throat. Headache. Fatigue. Fever. Loss of appetite. Pain in your forehead, behind your eyes, and over your cheekbones (sinus pain). Muscle aches. Redness or irritation of the eyes. Pressure in the ears or face. How is this diagnosed? This condition may be diagnosed based on your medical history and symptoms, and a physical exam. Your health care provider may use a swab to take a mucus sample from your nose (nasal swab). This sample can be tested to determine what virus is causing the illness. How is this treated? URIs usually get better on their own within 7-10 days. Medicines cannot cure URIs, but your health care provider may recommend certain medicines to help relieve symptoms, such as: Over-the-counter cold medicines. Cough suppressants. Coughing is a type of defense against infection that helps to clear the respiratory system, so take these medicines only as recommended by your health care provider. Fever-reducing medicines. Follow these instructions at home: Activity Rest as needed. If you have a fever, stay home from work or school until your fever is gone or until your health care provider says your URI cannot spread to other people (is no longer contagious). Your health care provider may have you wear a face mask to prevent your infection from spreading. Relieving symptoms Gargle with a mixture of salt and water 3-4 times a day or as needed. To make salt water, completely dissolve -1 tsp (3-6 g) of salt in 1 cup (237 mL) of warm water. Use a cool-mist humidifier to add moisture to the air. This can help  you breathe more easily. Eating and drinking  Drink enough fluid to keep your urine pale yellow. Eat soups and other clear broths. General instructions  Take over-the-counter and prescription medicines only as told by your health care provider. These include cold medicines, fever reducers, and cough suppressants. Do not use any products that contain nicotine or tobacco. These products include cigarettes, chewing tobacco, and vaping devices, such as e-cigarettes. If you need help quitting, ask your health care provider. Stay away from secondhand smoke. Stay up to date on all immunizations, including the yearly (annual) flu vaccine. Keep all follow-up visits. This is important. How to prevent the spread of infection to others URIs can be contagious. To prevent the infection from spreading: Wash your hands with soap and water for at least 20 seconds. If soap and water are not available, use hand sanitizer. Avoid touching your mouth, face, eyes, or nose. Cough or sneeze into a tissue or your sleeve or elbow instead of into your hand or into the air.  Contact a health care  provider if: You are getting worse instead of better. You have a fever or chills. Your mucus is brown or red. You have yellow or brown discharge coming from your nose. You have pain in your face, especially when you bend forward. You have swollen neck glands. You have pain while swallowing. You have white areas in the back of your throat. Get help right away if: You have shortness of breath that gets worse. You have severe or persistent: Headache. Ear pain. Sinus pain. Chest pain. You have chronic lung disease along with any of the following: Making high-pitched whistling sounds when you breathe, most often when you breathe out (wheezing). Prolonged cough (more than 14 days). Coughing up blood. A change in your usual mucus. You have a stiff neck. You have changes in  your: Vision. Hearing. Thinking. Mood. These symptoms may be an emergency. Get help right away. Call 911. Do not wait to see if the symptoms will go away. Do not drive yourself to the hospital. Summary An upper respiratory infection (URI) is a common infection of the nose, throat, and upper air passages that lead to the lungs. A URI is caused by a virus. URIs usually get better on their own within 7-10 days. Medicines cannot cure URIs, but your health care provider may recommend certain medicines to help relieve symptoms. This information is not intended to replace advice given to you by your health care provider. Make sure you discuss any questions you have with your health care provider. Document Revised: 12/26/2020 Document Reviewed: 12/26/2020 Elsevier Patient Education  2024 [employer]   If you have been instructed to have an in-person evaluation today at a local Urgent Care facility, please use the link below. It will take you to a list of all of our available [facility], including address, phone number and hours of operation. Please do not delay care.  [facility]  If you or a family member do not have a primary care provider, use the link below to schedule a visit and establish care. When you choose a [facility] primary care physician or advanced practice provider, you gain a long-term partner in health. Find a Primary Care Provider  Learn more about [facility] in-office and virtual care options: [facility] - Get Care Now

## 2024-04-04 NOTE — Progress Notes (Signed)
 Virtual Visit Consent   Brittany Brooks, you are scheduled for a virtual visit with a [facility] provider today. Just as with appointments in the office, your consent must be obtained to participate. Your consent will be active for this visit and any virtual visit you may have with one of our providers in the next 365 days. If you have a MyChart account, a copy of this consent can be sent to you electronically.  As this is a virtual visit, video technology does not allow for your provider to perform a traditional examination. This may limit your provider's ability to fully assess your condition. If your provider identifies any concerns that need to be evaluated in person or the need to arrange testing (such as labs, EKG, etc.), we will make arrangements to do so. Although advances in technology are sophisticated, we cannot ensure that it will always work on either your end or our end. If the connection with a video visit is poor, the visit may have to be switched to a telephone visit. With either a video or telephone visit, we are not always able to ensure that we have a secure connection.  By engaging in this virtual visit, you consent to the provision of healthcare and authorize for your insurance to be billed (if applicable) for the services provided during this visit. Depending on your insurance coverage, you may receive a charge related to this service.  I need to obtain your verbal consent now. Are you willing to proceed with your visit today? Brittany Brooks has provided verbal consent on 04/04/2024 for a virtual visit (video or telephone). Brittany [name], PA-C  Date: 04/04/2024 12:29 PM   Virtual Visit via Video Note   I, Brittany Brooks, connected with  Brittany Brooks  ([id number], 09/17/1965) on 04/04/24 at 12:15 PM EDT by a video-enabled telemedicine application and verified that I am speaking with the correct person using two identifiers.  Location: Patient: Virtual Visit Location  Patient: Home Provider: Virtual Visit Location Provider: Home Office   I discussed the limitations of evaluation and management by telemedicine and the availability of in person appointments. The patient expressed understanding and agreed to proceed.    History of Present Illness: Brittany Brooks is a 58 y.o. who identifies as a female who was assigned female at birth, and is being seen today for URI symptoms.  HPI: URI  This is a new problem. Episode onset: 9-10 days. The problem has been gradually worsening. There has been no fever. Associated symptoms include congestion, coughing, diarrhea, ear pain, headaches, rhinorrhea and a sore throat (initial symptom). Pertinent negatives include no nausea, plugged ear sensation, sinus pain or wheezing. Associated symptoms comments: Decreased appetite. Treatments tried: cough drops, vicks vapor rub, sinus saline rinse, Mucinex plain. The treatment provided no relief.     Problems:  Patient Active Problem List   Diagnosis Date Noted   PVC (premature ventricular contraction) 10/29/2023   Diverticulitis of sigmoid colon 06/19/2023   Encounter for Papanicolaou smear for cervical cancer screening 12/23/2022   Chronic congestion of paranasal sinus 12/23/2022   Menopause 12/23/2022   Paroxysmal SVT (supraventricular tachycardia) 11/11/2022   Nocturia 10/07/2021   Aortic atherosclerosis 12/15/2020   Encounter for preventive health examination 12/15/2020   Menopausal flushing 05/19/2018   Chondromalacia patellae 10/19/2017   Osteoarthritis of knee 10/19/2017   OSA (obstructive sleep apnea) 10/14/2015   Mixed hyperlipidemia 06/29/2015   Personal history of other diseases of the digestive system 06/29/2015  Pre-diabetes 05/18/2015   Vitamin D  deficiency 05/18/2015   Essential hypertension 09/24/2014   Cervical spine ankylosis 07/22/2012   Ankylosing spondylitis of cervical region (HCC) 07/22/2012   Obesity (BMI 30-39.9) 09/28/2011   Hypothyroidism  due to Hashimoto's thyroiditis 09/28/2011   Major depressive disorder, recurrent episode 09/28/2011   Multinodular goiter (nontoxic)    History of pancreatitis     Allergies:  Allergies  Allergen Reactions   Lexapro  [Escitalopram  Oxalate] Other (See Comments)    Tachycardia    Medications:  Current Outpatient Medications:    ALPRAZolam  (XANAX ) 0.25 MG tablet, TAKE 1 TABLET BY MOUTH AT BEDTIME AS NEEDED FOR ANXIETY, Disp: 30 tablet, Rfl: 5   cholecalciferol (VITAMIN D3) 25 MCG (1000 UNIT) tablet, Take 1,000 Units by mouth daily., Disp: , Rfl:    dicyclomine  (BENTYL ) 20 MG tablet, Take 1 tablet (20 mg total) by mouth every 6 (six) hours as needed for spasms. (Patient not taking: Reported on 12/29/2023), Disp: 20 tablet, Rfl: 0   estradiol  (ESTRACE  VAGINAL) 0.1 MG/GM vaginal cream, Insert 1 gram intravaginally at bedtime daily for 2 weeks,  Then reduce use to twice weekly thereafter, Disp: 42.5 g, Rfl: 12   LACTOBACILLUS PO, Take 1 capsule by mouth daily., Disp: , Rfl:    levothyroxine  (SYNTHROID ) 125 MCG tablet, TAKE 1 TABLET EVERY DAY ON EMPTY STOMACHWITH A GLASS OF WATER AT LEAST 30-60 MINBEFORE BREAKFAST, Disp: 90 tablet, Rfl: 1   metoprolol  tartrate (LOPRESSOR ) 25 MG tablet, Take 1 tablet (25 mg total) by mouth 2 (two) times daily as needed. For rapid heart rate (palpitations), Disp: 180 tablet, Rfl: 3   Misc Natural Products (T-RELIEF CBD+13) SUBL, One tablet as needed for anxiety, Disp: 90 tablet, Rfl: 0   Multiple Vitamin (MULTIVITAMIN) tablet, Take 1 tablet by mouth daily., Disp: , Rfl:    predniSONE  (DELTASONE ) 20 MG tablet, Take 2 tablets (40 mg total) by mouth daily with breakfast., Disp: 10 tablet, Rfl: 0   progesterone  (PROMETRIUM ) 100 MG capsule, TAKE 1 CAPSULE BY MOUTH AT BEDTIME, Disp: 30 capsule, Rfl: 1   promethazine-dextromethorphan (PROMETHAZINE-DM) 6.25-15 MG/5ML syrup, Take 5 mLs by mouth at bedtime as needed., Disp: 118 mL, Rfl: 0   zinc gluconate 50 MG tablet, Take 50 mg  by mouth daily., Disp: , Rfl:   Observations/Objective: Patient is well-developed, well-nourished in no acute distress.  Resting comfortably at home.  Head is normocephalic, atraumatic.  No labored breathing.  Speech is clear and coherent with logical content.  Patient is alert and oriented at baseline.    Assessment and Plan: 1. Upper respiratory tract infection, unspecified type (Primary) - promethazine-dextromethorphan (PROMETHAZINE-DM) 6.25-15 MG/5ML syrup; Take 5 mLs by mouth at bedtime as needed.  Dispense: 118 mL; Refill: 0 - predniSONE  (DELTASONE ) 20 MG tablet; Take 2 tablets (40 mg total) by mouth daily with breakfast.  Dispense: 10 tablet; Refill: 0  - Suspect viral URI - Symptomatic medications of choice over the counter as needed - Added Promethazine DM for nighttime cough - Prednisone  added for inflammation and congestion - Push fluids - Rest - Seek further evaluation if symptoms change or worsen   Follow Up Instructions: I discussed the assessment and treatment plan with the patient. The patient was provided an opportunity to ask questions and all were answered. The patient agreed with the plan and demonstrated an understanding of the instructions.  A copy of instructions were sent to the patient via MyChart unless otherwise noted below.    The patient was advised to  call back or seek an in-person evaluation if the symptoms worsen or if the condition fails to improve as anticipated.    Brittany [name], PA-C

## 2024-04-22 ENCOUNTER — Other Ambulatory Visit: Payer: Self-pay | Admitting: Internal Medicine
